# Patient Record
Sex: Female | Born: 1950 | Race: White | Hispanic: No | Marital: Married | Smoking: Never smoker
Health system: Southern US, Community
[De-identification: ages and names within clinical notes are randomized; demographics above are authoritative.]

## PROBLEM LIST (undated history)

## (undated) DIAGNOSIS — R32 Unspecified urinary incontinence: Secondary | ICD-10-CM

## (undated) DIAGNOSIS — R87619 Unspecified abnormal cytological findings in specimens from cervix uteri: Secondary | ICD-10-CM

## (undated) DIAGNOSIS — Z8619 Personal history of other infectious and parasitic diseases: Secondary | ICD-10-CM

## (undated) DIAGNOSIS — K219 Gastro-esophageal reflux disease without esophagitis: Secondary | ICD-10-CM

## (undated) DIAGNOSIS — M1990 Unspecified osteoarthritis, unspecified site: Secondary | ICD-10-CM

## (undated) DIAGNOSIS — IMO0002 Reserved for concepts with insufficient information to code with codable children: Secondary | ICD-10-CM

## (undated) DIAGNOSIS — I10 Essential (primary) hypertension: Secondary | ICD-10-CM

## (undated) DIAGNOSIS — C4491 Basal cell carcinoma of skin, unspecified: Secondary | ICD-10-CM

## (undated) DIAGNOSIS — E785 Hyperlipidemia, unspecified: Secondary | ICD-10-CM

## (undated) HISTORY — DX: Unspecified urinary incontinence: R32

## (undated) HISTORY — DX: Unspecified osteoarthritis, unspecified site: M19.90

## (undated) HISTORY — DX: Personal history of other infectious and parasitic diseases: Z86.19

## (undated) HISTORY — DX: Unspecified abnormal cytological findings in specimens from cervix uteri: R87.619

## (undated) HISTORY — DX: Hyperlipidemia, unspecified: E78.5

## (undated) HISTORY — DX: Reserved for concepts with insufficient information to code with codable children: IMO0002

## (undated) HISTORY — DX: Basal cell carcinoma of skin, unspecified: C44.91

## (undated) HISTORY — PX: EYE SURGERY: SHX253

## (undated) HISTORY — PX: BREAST CYST ASPIRATION: SHX578

## (undated) HISTORY — DX: Gastro-esophageal reflux disease without esophagitis: K21.9

## (undated) HISTORY — DX: Essential (primary) hypertension: I10

---
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
## 1997-06-12 ENCOUNTER — Other Ambulatory Visit: Admission: RE | Admit: 1997-06-12 | Discharge: 1997-06-12 | Payer: Self-pay | Admitting: Obstetrics and Gynecology

## 1998-08-18 ENCOUNTER — Other Ambulatory Visit: Admission: RE | Admit: 1998-08-18 | Discharge: 1998-08-18 | Payer: Self-pay | Admitting: Obstetrics and Gynecology

## 1999-08-06 ENCOUNTER — Encounter: Payer: Self-pay | Admitting: Obstetrics and Gynecology

## 1999-08-06 ENCOUNTER — Encounter: Admission: RE | Admit: 1999-08-06 | Discharge: 1999-08-06 | Payer: Self-pay | Admitting: Obstetrics and Gynecology

## 1999-08-26 ENCOUNTER — Other Ambulatory Visit: Admission: RE | Admit: 1999-08-26 | Discharge: 1999-08-26 | Payer: Self-pay | Admitting: Obstetrics and Gynecology

## 2000-08-14 ENCOUNTER — Encounter: Payer: Self-pay | Admitting: Obstetrics and Gynecology

## 2000-08-14 ENCOUNTER — Encounter: Admission: RE | Admit: 2000-08-14 | Discharge: 2000-08-14 | Payer: Self-pay | Admitting: Obstetrics and Gynecology

## 2000-08-28 ENCOUNTER — Other Ambulatory Visit: Admission: RE | Admit: 2000-08-28 | Discharge: 2000-08-28 | Payer: Self-pay | Admitting: Obstetrics and Gynecology

## 2000-09-06 ENCOUNTER — Other Ambulatory Visit: Admission: RE | Admit: 2000-09-06 | Discharge: 2000-09-06 | Payer: Self-pay | Admitting: Obstetrics and Gynecology

## 2001-09-21 ENCOUNTER — Encounter: Admission: RE | Admit: 2001-09-21 | Discharge: 2001-09-21 | Payer: Self-pay | Admitting: Unknown Physician Specialty

## 2001-09-21 ENCOUNTER — Encounter: Payer: Self-pay | Admitting: Unknown Physician Specialty

## 2002-09-23 ENCOUNTER — Encounter: Admission: RE | Admit: 2002-09-23 | Discharge: 2002-09-23 | Payer: Self-pay | Admitting: Unknown Physician Specialty

## 2002-09-23 ENCOUNTER — Encounter: Payer: Self-pay | Admitting: Unknown Physician Specialty

## 2004-05-03 ENCOUNTER — Ambulatory Visit: Payer: Self-pay | Admitting: Unknown Physician Specialty

## 2004-11-22 ENCOUNTER — Ambulatory Visit: Payer: Self-pay | Admitting: Gastroenterology

## 2004-12-01 ENCOUNTER — Ambulatory Visit: Payer: Self-pay | Admitting: Unknown Physician Specialty

## 2005-12-01 ENCOUNTER — Ambulatory Visit: Payer: Self-pay | Admitting: Unknown Physician Specialty

## 2006-02-21 HISTORY — PX: COLONOSCOPY: SHX174

## 2006-12-07 ENCOUNTER — Ambulatory Visit: Payer: Self-pay | Admitting: Unknown Physician Specialty

## 2007-02-12 ENCOUNTER — Ambulatory Visit: Payer: Self-pay | Admitting: Unknown Physician Specialty

## 2007-12-27 ENCOUNTER — Ambulatory Visit: Payer: Self-pay | Admitting: Unknown Physician Specialty

## 2008-02-22 HISTORY — PX: FINE NEEDLE ASPIRATION: SHX406

## 2009-01-07 ENCOUNTER — Ambulatory Visit: Payer: Self-pay | Admitting: Unknown Physician Specialty

## 2009-01-13 ENCOUNTER — Ambulatory Visit: Payer: Self-pay | Admitting: Unknown Physician Specialty

## 2009-02-24 ENCOUNTER — Ambulatory Visit: Payer: Self-pay | Admitting: Unknown Physician Specialty

## 2010-01-08 ENCOUNTER — Ambulatory Visit: Payer: Self-pay | Admitting: General Surgery

## 2011-03-03 ENCOUNTER — Ambulatory Visit: Payer: Self-pay

## 2012-01-11 LAB — HM MAMMOGRAPHY

## 2012-01-12 LAB — HM PAP SMEAR: HM Pap smear: NEGATIVE

## 2012-11-16 ENCOUNTER — Ambulatory Visit (INDEPENDENT_AMBULATORY_CARE_PROVIDER_SITE_OTHER): Payer: BC Managed Care – PPO | Admitting: Internal Medicine

## 2012-11-16 ENCOUNTER — Encounter: Payer: Self-pay | Admitting: Internal Medicine

## 2012-11-16 VITALS — BP 110/70 | HR 66 | Temp 98.1°F | Ht 64.5 in | Wt 141.2 lb

## 2012-11-16 DIAGNOSIS — IMO0002 Reserved for concepts with insufficient information to code with codable children: Secondary | ICD-10-CM

## 2012-11-16 DIAGNOSIS — R6889 Other general symptoms and signs: Secondary | ICD-10-CM

## 2012-11-16 DIAGNOSIS — R32 Unspecified urinary incontinence: Secondary | ICD-10-CM

## 2012-11-16 DIAGNOSIS — R002 Palpitations: Secondary | ICD-10-CM

## 2012-11-16 DIAGNOSIS — F439 Reaction to severe stress, unspecified: Secondary | ICD-10-CM

## 2012-11-16 DIAGNOSIS — Z733 Stress, not elsewhere classified: Secondary | ICD-10-CM

## 2012-11-16 DIAGNOSIS — R0602 Shortness of breath: Secondary | ICD-10-CM

## 2012-11-18 ENCOUNTER — Encounter: Payer: Self-pay | Admitting: Internal Medicine

## 2012-11-18 DIAGNOSIS — R32 Unspecified urinary incontinence: Secondary | ICD-10-CM | POA: Insufficient documentation

## 2012-11-18 DIAGNOSIS — R87619 Unspecified abnormal cytological findings in specimens from cervix uteri: Secondary | ICD-10-CM | POA: Insufficient documentation

## 2012-11-18 DIAGNOSIS — F439 Reaction to severe stress, unspecified: Secondary | ICD-10-CM | POA: Insufficient documentation

## 2012-11-18 NOTE — Progress Notes (Signed)
Subjective:    Patient ID: Theresa Francis, female    DOB: 09/21/1950, 62 y.o.   MRN: [id number]  HPI 62 year old female with past history of abnormal pap smear who comes in today to establish care.  She has been followed at [facility].  Last physical at [facility] was 11/14.  Pap smears are done every other year.  Has minimal incontinence.  Has noticed some pressure when she is sitting.  Notices the pressure between her rectum and vagina (on left side).  No back or hip issues.  Not a severe pain.  Has been checked on multiple occasions with no etiology found.  Exercises.  Does Yoga three days/week.  Walks two days per week.  Has noticed some light headedness with increased stress.  No headache.  Some increased palpitations and chest discomfort.  Seems to be getting more frequent and more intense.     Past Medical History  Diagnosis Date  . History of chicken pox   . History of shingles   . Urine incontinence     H/O occasional  . Abnormal Pap smear     s/p conization.     Outpatient Encounter Prescriptions as of 11/16/2012  Medication Sig Dispense Refill  . aspirin EC 81 MG tablet Take 81 mg by mouth daily.      . Calcium Carbonate-Vitamin D (CALTRATE 600+D PO) Take by mouth daily.      . cetirizine (ZYRTEC) 10 MG tablet Take 10 mg by mouth daily as needed for allergies.      . Glucosamine-MSM-Hyaluronic Acd (JOINT HEALTH PO) Take by mouth as needed (joint pain). Emergen-C powder       No facility-administered encounter medications on file as of 11/16/2012.    Review of Systems Patient denies any headache, lightheadedness or dizziness.  No sinus or allergy symptoms.  Reports the chest discomfort as outlined.  Some palpitations.    No increased shortness of breath, cough or congestion.  No nausea or vomiting.  No acid reflux.  No abdominal pain or cramping.  No bowel change, such as diarrhea, constipation, BRBPR or melana.  Some occasional urinary incontinence.       Objective:   Physical Exam Filed Vitals:   11/16/12 1048  BP: 110/70  Pulse: 66  Temp: 98.1 F (58.28 C)   62 year old female in no acute distress.   HEENT:  Nares- clear.  Oropharynx - without lesions. NECK:  Supple.  Nontender.  No audible bruit.  HEART:  Appears to be regular. LUNGS:  No crackles or wheezing audible.  Respirations even and unlabored.  RADIAL PULSE:  Equal bilaterally.    BREASTS:  Performed by gyn.   ABDOMEN:  Soft, nontender.  Bowel sounds present and normal.  No audible abdominal bruit.  GU:  Performed by gyn.     EXTREMITIES:  No increased edema present.  DP pulses palpable and equal bilaterally.          Assessment & Plan:  CARDIOVASCULAR.  Symptoms as outlined.  EKG obtained and revealed SR with no acute ischemic changes.  Given persistent symptoms and given increase in symptoms, will obtain stress echo to confirm no active ischemia.  Continue risk factor modification.    HEALTH MAINTENANCE.  Had her breast, pelvic and pap smear at [facility] in 11/13.  States everything checked out fine.  Due in 11/14 for pelvic and mammo.  Colonoscopy 2007.  No polyps.  Recommend a f/u in 10 years.    I spent  45 minutes with the patient and more than 50% of the time was spent in consultation regarding the above.

## 2012-11-18 NOTE — Assessment & Plan Note (Signed)
Previous abnormal pap.  S/p conization.  Most recent paps ok.  Followed at [facility].

## 2012-11-18 NOTE — Assessment & Plan Note (Signed)
Saw a counselor when her sister passed away.  Still with some stress.  Feels she is handling things relatively well.  Will give her the name and number of a counselor.  Follow.

## 2012-11-18 NOTE — Assessment & Plan Note (Signed)
Appears to have minimal stress incontinence.  Discussed Kaegel exercises.  Follow.

## 2012-11-20 ENCOUNTER — Telehealth: Payer: Self-pay | Admitting: Emergency Medicine

## 2012-11-20 NOTE — Telephone Encounter (Signed)
 [facility] 12/07/12 @ 8am. Arrive @ 750am. The patient is aware of the apt

## 2012-11-25 ENCOUNTER — Other Ambulatory Visit: Payer: Self-pay | Admitting: Internal Medicine

## 2012-11-25 DIAGNOSIS — R32 Unspecified urinary incontinence: Secondary | ICD-10-CM

## 2012-11-25 DIAGNOSIS — R0602 Shortness of breath: Secondary | ICD-10-CM

## 2012-11-25 DIAGNOSIS — Z13220 Encounter for screening for lipoid disorders: Secondary | ICD-10-CM

## 2012-11-25 DIAGNOSIS — F439 Reaction to severe stress, unspecified: Secondary | ICD-10-CM

## 2012-11-25 NOTE — Progress Notes (Signed)
Orders placed for labs

## 2012-11-26 ENCOUNTER — Other Ambulatory Visit (INDEPENDENT_AMBULATORY_CARE_PROVIDER_SITE_OTHER): Payer: BC Managed Care – PPO

## 2012-11-26 DIAGNOSIS — Z13220 Encounter for screening for lipoid disorders: Secondary | ICD-10-CM

## 2012-11-26 DIAGNOSIS — Z733 Stress, not elsewhere classified: Secondary | ICD-10-CM

## 2012-11-26 DIAGNOSIS — R0602 Shortness of breath: Secondary | ICD-10-CM

## 2012-11-26 DIAGNOSIS — F439 Reaction to severe stress, unspecified: Secondary | ICD-10-CM

## 2012-11-26 LAB — COMPREHENSIVE METABOLIC PANEL
ALT: 14 U/L (ref 0–35)
AST: 18 U/L (ref 0–37)
Albumin: 4.3 g/dL (ref 3.5–5.2)
Alkaline Phosphatase: 63 U/L (ref 39–117)
Calcium: 9 mg/dL (ref 8.4–10.5)
Chloride: 106 mEq/L (ref 96–112)
Potassium: 4.3 mEq/L (ref 3.5–5.1)
Total Protein: 7.2 g/dL (ref 6.0–8.3)

## 2012-11-26 LAB — CBC WITH DIFFERENTIAL/PLATELET
Basophils Absolute: 0 10*3/uL (ref 0.0–0.1)
Basophils Relative: 0.6 % (ref 0.0–3.0)
Eosinophils Absolute: 0.1 10*3/uL (ref 0.0–0.7)
Eosinophils Relative: 1.4 % (ref 0.0–5.0)
HCT: 37.8 % (ref 36.0–46.0)
Hemoglobin: 12.8 g/dL (ref 12.0–15.0)
Lymphocytes Relative: 28.7 % (ref 12.0–46.0)
Lymphs Abs: 2.1 10*3/uL (ref 0.7–4.0)
MCHC: 34 g/dL (ref 30.0–36.0)
MCV: 85.4 fl (ref 78.0–100.0)
Monocytes Absolute: 0.6 10*3/uL (ref 0.1–1.0)
Monocytes Relative: 7.8 % (ref 3.0–12.0)
Neutro Abs: 4.4 10*3/uL (ref 1.4–7.7)
Neutrophils Relative %: 61.5 % (ref 43.0–77.0)
Platelets: 298 10*3/uL (ref 150.0–400.0)
RBC: 4.43 Mil/uL (ref 3.87–5.11)
RDW: 13.5 % (ref 11.5–14.6)
WBC: 7.2 10*3/uL (ref 4.5–10.5)

## 2012-11-26 LAB — LIPID PANEL
Total CHOL/HDL Ratio: 4
Triglycerides: 60 mg/dL (ref 0.0–149.0)
VLDL: 12 mg/dL (ref 0.0–40.0)

## 2012-11-26 LAB — LDL CHOLESTEROL, DIRECT: Direct LDL: 130.6 mg/dL

## 2012-11-27 ENCOUNTER — Other Ambulatory Visit: Payer: Self-pay | Admitting: Internal Medicine

## 2012-11-27 NOTE — Progress Notes (Signed)
Orders placed for f/u liver panel.

## 2012-11-30 ENCOUNTER — Encounter: Payer: Self-pay | Admitting: *Deleted

## 2012-12-07 ENCOUNTER — Ambulatory Visit: Payer: BC Managed Care – PPO | Admitting: Cardiology

## 2012-12-10 ENCOUNTER — Other Ambulatory Visit (INDEPENDENT_AMBULATORY_CARE_PROVIDER_SITE_OTHER): Payer: BC Managed Care – PPO

## 2012-12-10 DIAGNOSIS — R17 Unspecified jaundice: Secondary | ICD-10-CM

## 2012-12-10 LAB — HEPATIC FUNCTION PANEL
ALT: 17 U/L (ref 0–35)
AST: 19 U/L (ref 0–37)
Albumin: 4.4 g/dL (ref 3.5–5.2)
Alkaline Phosphatase: 68 U/L (ref 39–117)
Bilirubin, Direct: 0.2 mg/dL (ref 0.0–0.3)
Total Bilirubin: 1.7 mg/dL — ABNORMAL HIGH (ref 0.3–1.2)
Total Protein: 7.1 g/dL (ref 6.0–8.3)

## 2012-12-11 ENCOUNTER — Encounter: Payer: Self-pay | Admitting: Internal Medicine

## 2012-12-11 DIAGNOSIS — M8580 Other specified disorders of bone density and structure, unspecified site: Secondary | ICD-10-CM | POA: Insufficient documentation

## 2012-12-11 DIAGNOSIS — M810 Age-related osteoporosis without current pathological fracture: Secondary | ICD-10-CM | POA: Insufficient documentation

## 2012-12-13 ENCOUNTER — Other Ambulatory Visit: Payer: Self-pay | Admitting: Internal Medicine

## 2012-12-19 ENCOUNTER — Ambulatory Visit: Payer: Self-pay | Admitting: Internal Medicine

## 2012-12-21 ENCOUNTER — Other Ambulatory Visit: Payer: BC Managed Care – PPO

## 2012-12-23 ENCOUNTER — Other Ambulatory Visit: Payer: Self-pay | Admitting: Internal Medicine

## 2012-12-23 NOTE — Progress Notes (Signed)
Order placed for f/u liver panel.  

## 2013-01-10 ENCOUNTER — Other Ambulatory Visit (INDEPENDENT_AMBULATORY_CARE_PROVIDER_SITE_OTHER): Payer: BC Managed Care – PPO

## 2013-01-10 DIAGNOSIS — R0602 Shortness of breath: Secondary | ICD-10-CM

## 2013-01-10 DIAGNOSIS — R079 Chest pain, unspecified: Secondary | ICD-10-CM

## 2013-01-11 ENCOUNTER — Encounter: Payer: Self-pay | Admitting: *Deleted

## 2013-01-22 ENCOUNTER — Ambulatory Visit (INDEPENDENT_AMBULATORY_CARE_PROVIDER_SITE_OTHER): Payer: BC Managed Care – PPO | Admitting: Internal Medicine

## 2013-01-22 ENCOUNTER — Other Ambulatory Visit (HOSPITAL_COMMUNITY)
Admission: RE | Admit: 2013-01-22 | Discharge: 2013-01-22 | Disposition: A | Discharge status: Home / Self Care | Payer: BC Managed Care – PPO | Source: Ambulatory Visit | Attending: Internal Medicine | Admitting: Internal Medicine

## 2013-01-22 ENCOUNTER — Encounter: Payer: Self-pay | Admitting: Internal Medicine

## 2013-01-22 VITALS — BP 118/70 | HR 74 | Temp 98.6°F | Ht 63.0 in | Wt 139.8 lb

## 2013-01-22 DIAGNOSIS — Z1211 Encounter for screening for malignant neoplasm of colon: Secondary | ICD-10-CM

## 2013-01-22 DIAGNOSIS — Z124 Encounter for screening for malignant neoplasm of cervix: Secondary | ICD-10-CM

## 2013-01-22 DIAGNOSIS — F439 Reaction to severe stress, unspecified: Secondary | ICD-10-CM

## 2013-01-22 DIAGNOSIS — R17 Unspecified jaundice: Secondary | ICD-10-CM

## 2013-01-22 DIAGNOSIS — IMO0002 Reserved for concepts with insufficient information to code with codable children: Secondary | ICD-10-CM

## 2013-01-22 DIAGNOSIS — M899 Disorder of bone, unspecified: Secondary | ICD-10-CM

## 2013-01-22 DIAGNOSIS — M8580 Other specified disorders of bone density and structure, unspecified site: Secondary | ICD-10-CM

## 2013-01-22 DIAGNOSIS — Z01419 Encounter for gynecological examination (general) (routine) without abnormal findings: Secondary | ICD-10-CM | POA: Insufficient documentation

## 2013-01-22 DIAGNOSIS — Z1151 Encounter for screening for human papillomavirus (HPV): Secondary | ICD-10-CM | POA: Insufficient documentation

## 2013-01-22 DIAGNOSIS — Z733 Stress, not elsewhere classified: Secondary | ICD-10-CM

## 2013-01-22 DIAGNOSIS — R6889 Other general symptoms and signs: Secondary | ICD-10-CM

## 2013-01-22 DIAGNOSIS — R945 Abnormal results of liver function studies: Secondary | ICD-10-CM | POA: Insufficient documentation

## 2013-01-22 DIAGNOSIS — R7989 Other specified abnormal findings of blood chemistry: Secondary | ICD-10-CM

## 2013-01-22 LAB — HEPATIC FUNCTION PANEL
Albumin: 4.6 g/dL (ref 3.5–5.2)
Alkaline Phosphatase: 70 U/L (ref 39–117)
Total Bilirubin: 1.4 mg/dL — ABNORMAL HIGH (ref 0.3–1.2)
Total Protein: 7.4 g/dL (ref 6.0–8.3)

## 2013-01-22 NOTE — Progress Notes (Signed)
  Subjective:    Patient ID: Theresa Francis, female    DOB: [birth year], 62 y.o.   MRN: [id number]  HPI 62 year old female with past history of abnormal pap smear who comes in today for a complete physical exam.  She has been followed at [facility].   Pap smears are done every other year.  She is due now.  Exercises.  Does Yoga three days/week.  Walks two days per week.  No headache.  Handling stress well.  Her son-n-law has completed treatments.  Feels she is handling stress well.  Breathing stable.  No cardiac symptoms with increased activity or exertion.  No acid reflux.  No nausea or vomiting.  No bowel change.  Overall she feels she is doing relatively well.     Past Medical History  Diagnosis Date  . History of chicken pox   . History of shingles   . Urine incontinence     H/O occasional  . Abnormal Pap smear     s/p conization.     Outpatient Encounter Prescriptions as of 01/22/2013  Medication Sig  . Calcium Carbonate-Vitamin D (CALTRATE 600+D PO) Take by mouth daily.  . cetirizine (ZYRTEC) 10 MG tablet Take 10 mg by mouth daily as needed for allergies.  . Glucosamine-MSM-Hyaluronic Acd (JOINT HEALTH PO) Take by mouth as needed (joint pain). Emergen-C powder  . aspirin EC 81 MG tablet Take 81 mg by mouth daily.    Review of Systems Patient denies any headache, lightheadedness or dizziness.  No sinus or allergy symptoms.  No cardiac symptoms with increased activity or exertion.  No increased shortness of breath, cough or congestion.  No nausea or vomiting.  No acid reflux.  No abdominal pain or cramping.  No bowel change, such as diarrhea, constipation, BRBPR or melana.  Had persistent elevated bilirubin.  Liver function last checked wnl.  Abdominal ultrasound revealed no gallstones.  Appears to have a polyp present.  No other acute abnormality.       Objective:   Physical Exam  Filed Vitals:   01/22/13 1339  BP: 118/70  Pulse: 74  Temp: 98.6 F (19 C)   62 year old  female in no acute distress.   HEENT:  Nares- clear.  Oropharynx - without lesions. NECK:  Supple.  Nontender.  No audible bruit.  HEART:  Appears to be regular. LUNGS:  No crackles or wheezing audible.  Respirations even and unlabored.  RADIAL PULSE:  Equal bilaterally.    BREASTS:  No nipple discharge or nipple retraction present.  Could not appreciate any distinct nodules or axillary adenopathy.  ABDOMEN:  Soft, nontender.  Bowel sounds present and normal.  No audible abdominal bruit.  GU:  Normal external genitalia.  Vaginal vault without lesions.  Cervix identified.  Pap performed. Could not appreciate any adnexal masses or tenderness.   RECTAL:  Heme negative.   EXTREMITIES:  No increased edema present.  DP pulses palpable and equal bilaterally.          Assessment & Plan:  CARDIOVASCULAR. Currently asymptomatic.  Stress test negative.  Follow.    HEALTH MAINTENANCE.  Physical today.   Colonoscopy 2007.  No polyps.  Recommend a f/u in 10 years.    Mammogram is scheduled tomorrow.  IFOB given today.

## 2013-01-22 NOTE — Progress Notes (Signed)
Pre-visit discussion using our clinic review tool. No additional management support is needed unless otherwise documented below in the visit note.  

## 2013-01-23 ENCOUNTER — Ambulatory Visit: Payer: Self-pay | Admitting: Internal Medicine

## 2013-01-23 ENCOUNTER — Encounter: Payer: Self-pay | Admitting: *Deleted

## 2013-01-23 ENCOUNTER — Encounter: Payer: Self-pay | Admitting: Internal Medicine

## 2013-01-23 LAB — HM MAMMOGRAPHY

## 2013-01-26 ENCOUNTER — Encounter: Payer: Self-pay | Admitting: Internal Medicine

## 2013-01-26 NOTE — Assessment & Plan Note (Signed)
Saw a counselor when her sister passed away.  Still with some stress.  Feels she is handling things relatively well.

## 2013-01-26 NOTE — Assessment & Plan Note (Signed)
Recent liver function wnl.  Bilirubin elevated.  Recent abdominal ultrasound as outlined.  Recheck liver panel today.  Follow.  Currently asymptomatic.    

## 2013-01-26 NOTE — Assessment & Plan Note (Signed)
Previous abnormal pap.  S/p conization.  Most recent paps ok.  Was followed at [facility].  Repeat pelvic and pap today.

## 2013-01-26 NOTE — Assessment & Plan Note (Signed)
Weight bearing exercise.  Follow.   

## 2013-02-04 ENCOUNTER — Ambulatory Visit: Payer: Self-pay | Admitting: Internal Medicine

## 2013-02-04 LAB — HM MAMMOGRAPHY

## 2013-02-06 ENCOUNTER — Encounter: Payer: Self-pay | Admitting: Internal Medicine

## 2013-02-18 ENCOUNTER — Encounter: Payer: Self-pay | Admitting: Internal Medicine

## 2013-09-27 ENCOUNTER — Ambulatory Visit: Payer: Self-pay | Admitting: Internal Medicine

## 2013-09-27 ENCOUNTER — Ambulatory Visit (INDEPENDENT_AMBULATORY_CARE_PROVIDER_SITE_OTHER): Payer: BC Managed Care – PPO | Admitting: Internal Medicine

## 2013-09-27 ENCOUNTER — Telehealth: Payer: Self-pay | Admitting: Internal Medicine

## 2013-09-27 ENCOUNTER — Encounter: Payer: Self-pay | Admitting: Internal Medicine

## 2013-09-27 DIAGNOSIS — R945 Abnormal results of liver function studies: Secondary | ICD-10-CM

## 2013-09-27 DIAGNOSIS — F439 Reaction to severe stress, unspecified: Secondary | ICD-10-CM

## 2013-09-27 DIAGNOSIS — M8580 Other specified disorders of bone density and structure, unspecified site: Secondary | ICD-10-CM

## 2013-09-27 DIAGNOSIS — M949 Disorder of cartilage, unspecified: Secondary | ICD-10-CM

## 2013-09-27 DIAGNOSIS — M899 Disorder of bone, unspecified: Secondary | ICD-10-CM

## 2013-09-27 DIAGNOSIS — R7989 Other specified abnormal findings of blood chemistry: Secondary | ICD-10-CM

## 2013-09-27 DIAGNOSIS — M25559 Pain in unspecified hip: Secondary | ICD-10-CM | POA: Insufficient documentation

## 2013-09-27 DIAGNOSIS — R17 Unspecified jaundice: Secondary | ICD-10-CM

## 2013-09-27 DIAGNOSIS — Z733 Stress, not elsewhere classified: Secondary | ICD-10-CM

## 2013-09-27 LAB — HEPATIC FUNCTION PANEL
ALBUMIN: 4.4 g/dL (ref 3.5–5.2)
ALT: 18 U/L (ref 0–35)
AST: 20 U/L (ref 0–37)
Alkaline Phosphatase: 72 U/L (ref 39–117)
Bilirubin, Direct: 0.2 mg/dL (ref 0.0–0.3)
TOTAL PROTEIN: 7.4 g/dL (ref 6.0–8.3)
Total Bilirubin: 1.8 mg/dL — ABNORMAL HIGH (ref 0.2–1.2)

## 2013-09-27 MED ORDER — SERTRALINE HCL 50 MG PO TABS: 50.0000 mg | ORAL_TABLET | Freq: Every day | ORAL | Status: DC

## 2013-09-27 NOTE — Telephone Encounter (Signed)
Notify pt that I reveiwed her xray results and her hip xrays reveal arthritis changes bilaterally.  No acute fracture or dislocation seen.  The back xray reveals mild scoliosis (curvature of the spine) and arthritis changes.  Given her increased pain and xray findings, I would like to refer her to ortho and/or physical therapy for further evaluation.  If agreeable, let me know and I will place the order for the referral.    Dr Nicki Reaper

## 2013-09-27 NOTE — Progress Notes (Signed)
Pre visit review using our clinic review tool, if applicable. No additional management support is needed unless otherwise documented below in the visit note. 

## 2013-09-27 NOTE — Patient Instructions (Signed)
Theresa Francis -  Phone [phone]

## 2013-09-29 ENCOUNTER — Other Ambulatory Visit: Payer: Self-pay | Admitting: Internal Medicine

## 2013-09-29 ENCOUNTER — Encounter: Payer: Self-pay | Admitting: Internal Medicine

## 2013-09-29 NOTE — Progress Notes (Signed)
Order placed for f/u liver panel.  

## 2013-09-29 NOTE — Assessment & Plan Note (Addendum)
Increased stress as outlined.  Feels she is handling things relatively well.  Counseling if needed.  Number given for [employer] .

## 2013-09-29 NOTE — Progress Notes (Signed)
Subjective:    Patient ID: Theresa Francis, female    DOB: [birth year], 63 y.o.   MRN: [id number]  HPI 63 year old female with past history of abnormal pap smear who comes in today for a scheduled follow up.  Exercises.  Does Yoga three days/week.  Walks two days per week.  No headache.  Handling stress well.  Her son-n-law has completed treatments.  Other family stress dealing with family health issues.  Feels she is handling stress relatively well.  Breathing stable.  No cardiac symptoms with increased activity or exertion.  No acid reflux.  No nausea or vomiting.  No bowel change.  She has been having problems with her left hip > right.  Describes pain into her left groin.  Wakes up.  Occasional back pain.  Gait is different.  Has to occasionally lift her left leg to get into her car.  No known injury or trauma.    Past Medical History  Diagnosis Date  . History of chicken pox   . History of shingles   . Urine incontinence     H/O occasional  . Abnormal Pap smear     s/p conization.     Outpatient Encounter Prescriptions as of 09/27/2013  Medication Sig  . aspirin EC 81 MG tablet Take 81 mg by mouth daily.  . Calcium Carbonate-Vitamin D (CALTRATE 600+D PO) Take by mouth daily.  . cetirizine (ZYRTEC) 10 MG tablet Take 10 mg by mouth daily as needed for allergies.  Marland Kitchen NAPROXEN SODIUM PO Take by mouth daily.  . sertraline (ZOLOFT) 50 MG tablet Take 1 tablet (50 mg total) by mouth daily.  . [DISCONTINUED] Glucosamine-MSM-Hyaluronic Acd (JOINT HEALTH PO) Take by mouth as needed (joint pain). Emergen-C powder    Review of Systems Patient denies any headache, lightheadedness or dizziness.  No sinus or allergy symptoms.  No cardiac symptoms with increased activity or exertion.  No increased shortness of breath, cough or congestion.  No nausea or vomiting.  No acid reflux.  No abdominal pain or cramping.  No bowel change, such as diarrhea, constipation, BRBPR or melana.  Had persistent  elevated bilirubin.  Liver function last checked wnl.  Abdominal ultrasound revealed no gallstones.  Appears to have a polyp present.  No other acute abnormality.   Hip/groin pain as outlined.  Taking naproxen (one q day to two bid prn).       Objective:   Physical Exam  Filed Vitals:   09/27/13 0942  BP: 130/70  Pulse: 69  Temp: 98.5 F (43.103 C)   63 year old female in no acute distress.   HEENT:  Nares- clear.  Oropharynx - without lesions. NECK:  Supple.  Nontender.  No audible bruit.  HEART:  Appears to be regular. LUNGS:  No crackles or wheezing audible.  Respirations even and unlabored.  RADIAL PULSE:  Equal bilaterally.  ABDOMEN:  Soft, nontender.  Bowel sounds present and normal.  No audible abdominal bruit.   EXTREMITIES:  No increased edema present.  DP pulses palpable and equal bilaterally.   MSK:  Some increased pain with straight leg  Raise.  Increased discomfort in her groin.  Motor strength appears to be equal.  Increased discomfort with walking.  Increased discomfort with rotation of her upper leg.  No significant pain with abduction/adduction lower extremities.         Assessment & Plan:  CARDIOVASCULAR. Currently asymptomatic.  Stress test negative.  Follow.    HEALTH MAINTENANCE.  Physical  01/22/13.   Colonoscopy 2007.  No polyps.  Recommend a f/u in 10 years.    Mammogram 01/23/13 recommended follow up views.  F/u views 02/04/13 -Birads II.   I spent 25 minutes with the patient and more than 50% of the time was spent in consultation regarding the above.

## 2013-09-29 NOTE — Assessment & Plan Note (Signed)
Worsening pain as outlined.  Present for 18 months.  Symptoms and exam as outlined.  Check L-S spine xray and left/right hip xray.  Further w/up pending results.

## 2013-09-29 NOTE — Assessment & Plan Note (Signed)
Recent liver function wnl.  Bilirubin elevated.  Recent abdominal ultrasound as outlined.  Recheck liver panel today.  Follow.  Currently asymptomatic.

## 2013-09-29 NOTE — Assessment & Plan Note (Signed)
Previous abnormal pap.  S/p conization.  Most recent paps ok.  Was followed at [facility].  Repeat pap here 01/22/13 - negative with negative HPV.

## 2013-09-29 NOTE — Assessment & Plan Note (Signed)
Weight bearing exercise.  Follow.  Schedule f/u bone density.

## 2013-09-30 ENCOUNTER — Encounter: Payer: Self-pay | Admitting: *Deleted

## 2013-09-30 NOTE — Telephone Encounter (Signed)
Pt notified of results & will discuss further at her next appt

## 2013-10-16 ENCOUNTER — Ambulatory Visit: Payer: Self-pay | Admitting: Internal Medicine

## 2013-10-16 LAB — HM DEXA SCAN: HM DEXA SCAN: HIGH

## 2013-10-17 ENCOUNTER — Encounter: Payer: Self-pay | Admitting: *Deleted

## 2013-10-17 ENCOUNTER — Telehealth: Payer: Self-pay | Admitting: Internal Medicine

## 2013-10-17 NOTE — Telephone Encounter (Signed)
Notify pt that I received her bone density results and her bone density reveals osteoporosis.  I would like for her to schedule a f/u appt to discuss treatment options.  Schedule appt on 11/15/13 at 11:45 to discuss treatment options.

## 2013-10-17 NOTE — Telephone Encounter (Signed)
Letter mailed, & pt already has appt scheduled on 9/24 @ 11:30

## 2013-10-29 ENCOUNTER — Encounter (INDEPENDENT_AMBULATORY_CARE_PROVIDER_SITE_OTHER): Payer: Self-pay

## 2013-10-29 ENCOUNTER — Other Ambulatory Visit (INDEPENDENT_AMBULATORY_CARE_PROVIDER_SITE_OTHER): Payer: BC Managed Care – PPO

## 2013-10-29 DIAGNOSIS — R17 Unspecified jaundice: Secondary | ICD-10-CM

## 2013-10-29 LAB — HEPATIC FUNCTION PANEL
ALT: 15 U/L (ref 0–35)
AST: 21 U/L (ref 0–37)
Albumin: 4.5 g/dL (ref 3.5–5.2)
Alkaline Phosphatase: 67 U/L (ref 39–117)
BILIRUBIN DIRECT: 0.2 mg/dL (ref 0.0–0.3)
BILIRUBIN TOTAL: 1.1 mg/dL (ref 0.2–1.2)
Total Protein: 7.1 g/dL (ref 6.0–8.3)

## 2013-10-30 ENCOUNTER — Encounter: Payer: Self-pay | Admitting: *Deleted

## 2013-11-14 ENCOUNTER — Ambulatory Visit (INDEPENDENT_AMBULATORY_CARE_PROVIDER_SITE_OTHER): Payer: BC Managed Care – PPO | Admitting: Internal Medicine

## 2013-11-14 ENCOUNTER — Encounter: Payer: Self-pay | Admitting: Internal Medicine

## 2013-11-14 VITALS — BP 120/60 | HR 72 | Temp 98.6°F | Ht 63.0 in | Wt 135.2 lb

## 2013-11-14 DIAGNOSIS — M899 Disorder of bone, unspecified: Secondary | ICD-10-CM

## 2013-11-14 DIAGNOSIS — M949 Disorder of cartilage, unspecified: Secondary | ICD-10-CM

## 2013-11-14 DIAGNOSIS — M810 Age-related osteoporosis without current pathological fracture: Secondary | ICD-10-CM

## 2013-11-14 DIAGNOSIS — R945 Abnormal results of liver function studies: Secondary | ICD-10-CM

## 2013-11-14 DIAGNOSIS — M25559 Pain in unspecified hip: Secondary | ICD-10-CM

## 2013-11-14 DIAGNOSIS — Z733 Stress, not elsewhere classified: Secondary | ICD-10-CM

## 2013-11-14 DIAGNOSIS — R7989 Other specified abnormal findings of blood chemistry: Secondary | ICD-10-CM

## 2013-11-14 DIAGNOSIS — F439 Reaction to severe stress, unspecified: Secondary | ICD-10-CM

## 2013-11-14 DIAGNOSIS — M8580 Other specified disorders of bone density and structure, unspecified site: Secondary | ICD-10-CM

## 2013-11-14 LAB — COMPREHENSIVE METABOLIC PANEL
ALT: 18 U/L (ref 0–35)
AST: 17 U/L (ref 0–37)
Albumin: 4.6 g/dL (ref 3.5–5.2)
Alkaline Phosphatase: 74 U/L (ref 39–117)
BILIRUBIN TOTAL: 1.2 mg/dL (ref 0.2–1.2)
BUN: 12 mg/dL (ref 6–23)
CO2: 30 meq/L (ref 19–32)
CREATININE: 0.8 mg/dL (ref 0.4–1.2)
Calcium: 9.6 mg/dL (ref 8.4–10.5)
Chloride: 104 mEq/L (ref 96–112)
GFR: 80.2 mL/min (ref >60.00)
GLUCOSE: 97 mg/dL (ref 70–99)
Potassium: 4.8 mEq/L (ref 3.5–5.1)
Sodium: 141 mEq/L (ref 135–145)
Total Protein: 6.9 g/dL (ref 6.0–8.3)

## 2013-11-14 LAB — URINALYSIS, ROUTINE W REFLEX MICROSCOPIC
Hgb urine dipstick: NEGATIVE
Leukocytes, UA: NEGATIVE
NITRITE: NEGATIVE
RBC / HPF: NONE SEEN (ref 0–?)
Specific Gravity, Urine: 1.015 (ref 1.000–1.030)
UROBILINOGEN UA: 0.2 (ref 0.0–1.0)
Urine Glucose: NEGATIVE
WBC UA: NONE SEEN (ref 0–?)
pH: 7 (ref 5.0–8.0)

## 2013-11-14 LAB — VITAMIN D 25 HYDROXY (VIT D DEFICIENCY, FRACTURES): VITD: 32.59 ng/mL (ref 30.00–100.00)

## 2013-11-14 NOTE — Progress Notes (Signed)
Pre visit review using our clinic review tool, if applicable. No additional management support is needed unless otherwise documented below in the visit note. 

## 2013-11-15 ENCOUNTER — Encounter: Payer: Self-pay | Admitting: *Deleted

## 2013-11-15 ENCOUNTER — Ambulatory Visit: Payer: BC Managed Care – PPO | Admitting: Internal Medicine

## 2013-11-17 ENCOUNTER — Encounter: Payer: Self-pay | Admitting: Internal Medicine

## 2013-11-17 NOTE — Assessment & Plan Note (Signed)
Recent liver function wnl.  Bilirubin had been elevated.  Recent abdominal ultrasound as outlined.  Most recent bilirubin wnl.  Follow.  Currently asymptomatic.

## 2013-11-17 NOTE — Assessment & Plan Note (Signed)
Previous abnormal pap.  S/p conization.  Most recent paps ok.  Was followed at [facility].  Repeat pap here 01/22/13 - negative with negative HPV.

## 2013-11-17 NOTE — Progress Notes (Signed)
  Subjective:    Patient ID: Theresa Francis, female    DOB: 04-22-1950, 63 y.o.   MRN: [id number]  HPI 63 year old female with past history of abnormal pap smear who comes in today for a scheduled follow up.  Exercises.  Does Yoga three days/week.  Walks two days per week.  No headache.  Handling stress well.  Her son-n-law has completed treatments.  Other family stress dealing with family health issues.  Feels she is handling stress relatively well.  On zoloft now and feels this has helped to level things out.  Breathing stable.  No cardiac symptoms with increased activity or exertion. No acid reflux.  No nausea or vomiting.  No bowel change.   Has been having some hip pain.  See previous note for details.     Past Medical History  Diagnosis Date  . History of chicken pox   . History of shingles   . Urine incontinence     H/O occasional  . Abnormal Pap smear     s/p conization.     Outpatient Encounter Prescriptions as of 11/14/2013  Medication Sig  . aspirin EC 81 MG tablet Take 81 mg by mouth daily.  . Calcium Carbonate-Vitamin D (CALTRATE 600+D PO) Take by mouth daily.  . cetirizine (ZYRTEC) 10 MG tablet Take 10 mg by mouth daily as needed for allergies.  Marland Kitchen NAPROXEN SODIUM PO Take by mouth daily.  . sertraline (ZOLOFT) 50 MG tablet Take 1 tablet (50 mg total) by mouth daily.    Review of Systems Patient denies any headache, lightheadedness or dizziness.  No sinus or allergy symptoms.  No cardiac symptoms with increased activity or exertion.  No increased shortness of breath, cough or congestion.  No nausea or vomiting.  No acid reflux.  No abdominal pain or cramping.  No bowel change, such as diarrhea, constipation, BRBPR or melana.  Had persistent elevated bilirubin.  Liver function last checked wnl.  Bilirubin wnl.  Abdominal ultrasound revealed no gallstones.  Appears to have a polyp present.  No other acute abnormality.   Hip/groin pain as outlined.  Taking naproxen.        Objective:   Physical Exam  Filed Vitals:   11/14/13 1125  BP: 120/60  Pulse: 72  Temp: 98.6 F (13 C)   63 year old female in no acute distress.   HEENT:  Nares- clear.  Oropharynx - without lesions. NECK:  Supple.  Nontender.  No audible bruit.  HEART:  Appears to be regular. LUNGS:  No crackles or wheezing audible.  Respirations even and unlabored.  RADIAL PULSE:  Equal bilaterally.  ABDOMEN:  Soft, nontender.  Bowel sounds present and normal.  No audible abdominal bruit.   EXTREMITIES:  No increased edema present.  DP pulses palpable and equal bilaterally.   MSK:  Some increased pain with straight leg  Raise.  Increased discomfort in her groin.         Assessment & Plan:  CARDIOVASCULAR. Currently asymptomatic.  Stress test negative.  Follow.    HEALTH MAINTENANCE.  Physical 01/22/13.   Colonoscopy 2007.  No polyps.  Recommend a f/u in 10 years.    Mammogram 01/23/13 recommended follow up views.  F/u views 02/04/13 -Birads II.

## 2013-11-17 NOTE — Assessment & Plan Note (Signed)
Bone density 10/16/13 - -1.9 spine and -2.6 femoral neck.  Discussed treatment options.  Prefers reclast.  Form completed.

## 2013-11-17 NOTE — Assessment & Plan Note (Signed)
Increased stress as outlined.  On zoloft.  Doing better.  Follow.

## 2013-11-17 NOTE — Assessment & Plan Note (Signed)
Worsening pain as outlined.  Present for 18 months.  Symptoms and exam as outlined.  Given persistent pain, will refer to PT for further evaluation and treatment.

## 2013-11-21 ENCOUNTER — Ambulatory Visit: Payer: BC Managed Care – PPO | Admitting: Internal Medicine

## 2013-11-25 ENCOUNTER — Telehealth: Payer: Self-pay | Admitting: *Deleted

## 2013-11-25 NOTE — Telephone Encounter (Signed)
Reclast form, demographics, notes, hip x-ray, & bone density results faxed to [facility]

## 2013-12-14 ENCOUNTER — Other Ambulatory Visit: Payer: Self-pay | Admitting: Internal Medicine

## 2014-01-10 ENCOUNTER — Encounter: Payer: Self-pay | Admitting: Internal Medicine

## 2014-01-30 ENCOUNTER — Ambulatory Visit (INDEPENDENT_AMBULATORY_CARE_PROVIDER_SITE_OTHER): Payer: BC Managed Care – PPO | Admitting: Internal Medicine

## 2014-01-30 ENCOUNTER — Encounter: Payer: Self-pay | Admitting: Internal Medicine

## 2014-01-30 VITALS — BP 110/70 | HR 71 | Temp 98.2°F | Ht 64.0 in | Wt 138.5 lb

## 2014-01-30 DIAGNOSIS — R87619 Unspecified abnormal cytological findings in specimens from cervix uteri: Secondary | ICD-10-CM

## 2014-01-30 DIAGNOSIS — M8580 Other specified disorders of bone density and structure, unspecified site: Secondary | ICD-10-CM

## 2014-01-30 DIAGNOSIS — E7800 Pure hypercholesterolemia, unspecified: Secondary | ICD-10-CM | POA: Insufficient documentation

## 2014-01-30 DIAGNOSIS — R7989 Other specified abnormal findings of blood chemistry: Secondary | ICD-10-CM

## 2014-01-30 DIAGNOSIS — Z1211 Encounter for screening for malignant neoplasm of colon: Secondary | ICD-10-CM

## 2014-01-30 DIAGNOSIS — M810 Age-related osteoporosis without current pathological fracture: Secondary | ICD-10-CM

## 2014-01-30 DIAGNOSIS — Z658 Other specified problems related to psychosocial circumstances: Secondary | ICD-10-CM

## 2014-01-30 DIAGNOSIS — R945 Abnormal results of liver function studies: Secondary | ICD-10-CM

## 2014-01-30 DIAGNOSIS — Z1239 Encounter for other screening for malignant neoplasm of breast: Secondary | ICD-10-CM

## 2014-01-30 DIAGNOSIS — F439 Reaction to severe stress, unspecified: Secondary | ICD-10-CM

## 2014-01-30 DIAGNOSIS — M25559 Pain in unspecified hip: Secondary | ICD-10-CM

## 2014-01-30 LAB — CBC WITH DIFFERENTIAL/PLATELET
BASOS PCT: 0.7 % (ref 0.0–3.0)
Basophils Absolute: 0 10*3/uL (ref 0.0–0.1)
EOS PCT: 0.9 % (ref 0.0–5.0)
Eosinophils Absolute: 0.1 10*3/uL (ref 0.0–0.7)
HEMATOCRIT: 39.7 % (ref 36.0–46.0)
Hemoglobin: 12.9 g/dL (ref 12.0–15.0)
LYMPHS ABS: 2.2 10*3/uL (ref 0.7–4.0)
Lymphocytes Relative: 35.2 % (ref 12.0–46.0)
MCHC: 32.6 g/dL (ref 30.0–36.0)
MCV: 87.3 fl (ref 78.0–100.0)
MONO ABS: 0.5 10*3/uL (ref 0.1–1.0)
Monocytes Relative: 7.4 % (ref 3.0–12.0)
Neutro Abs: 3.5 10*3/uL (ref 1.4–7.7)
Neutrophils Relative %: 55.8 % (ref 43.0–77.0)
Platelets: 309 10*3/uL (ref 150.0–400.0)
RBC: 4.55 Mil/uL (ref 3.87–5.11)
RDW: 14.1 % (ref 11.5–15.5)
WBC: 6.4 10*3/uL (ref 4.0–10.5)

## 2014-01-30 LAB — COMPREHENSIVE METABOLIC PANEL
ALBUMIN: 4.5 g/dL (ref 3.5–5.2)
ALK PHOS: 71 U/L (ref 39–117)
ALT: 17 U/L (ref 0–35)
AST: 20 U/L (ref 0–37)
BUN: 15 mg/dL (ref 6–23)
CO2: 26 meq/L (ref 19–32)
Calcium: 9.1 mg/dL (ref 8.4–10.5)
Chloride: 103 mEq/L (ref 96–112)
Creatinine, Ser: 0.6 mg/dL (ref 0.4–1.2)
GFR: 104.87 mL/min (ref >60.00)
GLUCOSE: 98 mg/dL (ref 70–99)
POTASSIUM: 4.4 meq/L (ref 3.5–5.1)
Sodium: 137 mEq/L (ref 135–145)
Total Bilirubin: 1.4 mg/dL — ABNORMAL HIGH (ref 0.2–1.2)
Total Protein: 7 g/dL (ref 6.0–8.3)

## 2014-01-30 LAB — TSH: TSH: 1.6 u[IU]/mL (ref 0.35–4.50)

## 2014-01-30 LAB — LIPID PANEL
CHOLESTEROL: 225 mg/dL — AB (ref 0–200)
HDL: 68.1 mg/dL (ref >39.00)
LDL Cholesterol: 146 mg/dL — ABNORMAL HIGH (ref 0–99)
NonHDL: 156.9
Total CHOL/HDL Ratio: 3
Triglycerides: 55 mg/dL (ref 0.0–149.0)
VLDL: 11 mg/dL (ref 0.0–40.0)

## 2014-01-30 NOTE — Progress Notes (Signed)
Subjective:    Patient ID: Theresa Francis, female    DOB: [birth year], 63 y.o.   MRN: [id number]  HPI 63 year old female with past history of abnormal pap smear who comes in today to follow up on some chronic issues and for her physical exam.  Exercises.  Does Yoga three days/week.  Walks.  No headache.  Family stress dealing with family health issues.  Brother-n-law passed away.  Feels she is handling stress relatively well.  On zoloft and feels this has helped to level things out.  Breathing stable.  No cardiac symptoms with increased activity or exertion. No acid reflux.  No nausea or vomiting.  No bowel change.   Persistent hip pain.  See previous note for details.  Xray with arthritis.  Needs physical therapy.     Past Medical History  Diagnosis Date  . History of chicken pox   . History of shingles   . Urine incontinence     H/O occasional  . Abnormal Pap smear     s/p conization.     Outpatient Encounter Prescriptions as of 01/30/2014  Medication Sig  . aspirin EC 81 MG tablet Take 81 mg by mouth daily.  . Calcium Carbonate-Vitamin D (CALTRATE 600+D PO) Take by mouth daily.  . cetirizine (ZYRTEC) 10 MG tablet Take 10 mg by mouth daily as needed for allergies.  . Glucos-Chondroit-Hyaluron-MSM (GLUCOSAMINE CHONDROITIN JOINT PO) Take by mouth 2 (two) times daily.  Marland Kitchen NAPROXEN SODIUM PO Take by mouth daily.  . sertraline (ZOLOFT) 50 MG tablet TAKE ONE TABLET BY MOUTH DAILY  . [DISCONTINUED] Cholecalciferol (VITAMIN D3) 1000 UNITS CAPS Take by mouth.    Review of Systems Patient denies any headache, lightheadedness or dizziness.  No sinus or allergy symptoms.  No cardiac symptoms with increased activity or exertion.  No increased shortness of breath, cough or congestion.  No nausea or vomiting.  No acid reflux.  No abdominal pain or cramping.  No bowel change, such as diarrhea, constipation, BRBPR or melana.  Had persistent elevated bilirubin.  Liver function last checked wnl.   Bilirubin wnl last check.  Abdominal ultrasound revealed no gallstones.  Appears to have a polyp present.  No other acute abnormality.   Hip/groin pain as outlined.  Taking naproxen.  Doing yoga.      Objective:   Physical Exam  Filed Vitals:   01/30/14 0819  BP: 110/70  Pulse: 71  Temp: 98.2 F (53.13 C)   63 year old female in no acute distress.   HEENT:  Nares- clear.  Oropharynx - without lesions. NECK:  Supple.  Nontender.  No audible bruit.  HEART:  Appears to be regular. LUNGS:  No crackles or wheezing audible.  Respirations even and unlabored.  RADIAL PULSE:  Equal bilaterally.    BREASTS:  No nipple discharge or nipple retraction present.  Could not appreciate any distinct nodules or axillary adenopathy.  ABDOMEN:  Soft, nontender.  Bowel sounds present and normal.  No audible abdominal bruit.  GU:  Not performed.    EXTREMITIES:  No increased edema present.  DP pulses palpable and equal bilaterally.          Assessment & Plan:   Abnormal liver function tests Last check wnl.  - Comprehensive metabolic panel; Future  Stress Doing well.  Follow.    Hip pain, unspecified laterality Persistent.  Previous xray with arthritis.  Bone density with osteoporosis of hip.   - Ambulatory referral to Physical Therapy  Abnormal  cervical Papanicolaou smear, unspecified abnormal pap finding Pap 01/22/13 negative with negative HPV.   Hypercholesterolemia Slight elevation in cholesterol.  Recheck today. Continue diet and exercise.   - Lipid panel; Future  Breast cancer screening - MM DIGITAL SCREENING BILATERAL; Future  Osteoporosis Recent bone density revealed osteoporosis.  Received reclast.    CARDIOVASCULAR. Currently asymptomatic.  Stress test negative.  Follow.    HEALTH MAINTENANCE.  Physical today.   Pap 01/22/13 negative with negative HPV.  Colonoscopy 2007.  No polyps.  Recommend a f/u in 10 years.    Mammogram 01/23/13 recommended follow up views.  F/u views 02/04/13  -Birads II.  Schedule f/u mammogram.  IFOB given.

## 2014-01-30 NOTE — Progress Notes (Signed)
Pre visit review using our clinic review tool, if applicable. No additional management support is needed unless otherwise documented below in the visit note. 

## 2014-01-31 ENCOUNTER — Other Ambulatory Visit (INDEPENDENT_AMBULATORY_CARE_PROVIDER_SITE_OTHER): Payer: BC Managed Care – PPO

## 2014-01-31 LAB — BILIRUBIN, DIRECT: Bilirubin, Direct: 0.2 mg/dL (ref 0.0–0.3)

## 2014-02-01 ENCOUNTER — Other Ambulatory Visit: Payer: Self-pay | Admitting: Internal Medicine

## 2014-02-01 NOTE — Progress Notes (Signed)
Order placed for f/u liver panel.  

## 2014-02-03 ENCOUNTER — Other Ambulatory Visit (INDEPENDENT_AMBULATORY_CARE_PROVIDER_SITE_OTHER): Payer: BC Managed Care – PPO

## 2014-02-03 ENCOUNTER — Encounter: Payer: Self-pay | Admitting: Internal Medicine

## 2014-02-03 DIAGNOSIS — Z1211 Encounter for screening for malignant neoplasm of colon: Secondary | ICD-10-CM

## 2014-02-04 LAB — FECAL OCCULT BLOOD, IMMUNOCHEMICAL: FECAL OCCULT BLD: NEGATIVE

## 2014-02-05 ENCOUNTER — Encounter: Payer: Self-pay | Admitting: Internal Medicine

## 2014-02-21 ENCOUNTER — Encounter: Payer: Self-pay | Admitting: Internal Medicine

## 2014-02-27 ENCOUNTER — Ambulatory Visit: Payer: Self-pay | Admitting: Internal Medicine

## 2014-03-11 ENCOUNTER — Ambulatory Visit: Payer: Self-pay | Admitting: Internal Medicine

## 2014-03-11 LAB — HM MAMMOGRAPHY

## 2014-03-12 ENCOUNTER — Encounter: Payer: Self-pay | Admitting: Internal Medicine

## 2014-03-24 ENCOUNTER — Encounter: Payer: Self-pay | Admitting: Internal Medicine

## 2014-04-22 ENCOUNTER — Encounter
Admit: 2014-04-22 | Disposition: A | Discharge status: Home / Self Care | Payer: Self-pay | Attending: Internal Medicine | Admitting: Internal Medicine

## 2014-04-23 ENCOUNTER — Other Ambulatory Visit: Payer: Self-pay | Admitting: Internal Medicine

## 2014-05-05 ENCOUNTER — Other Ambulatory Visit (INDEPENDENT_AMBULATORY_CARE_PROVIDER_SITE_OTHER): Payer: BC Managed Care – PPO

## 2014-05-05 ENCOUNTER — Other Ambulatory Visit (INDEPENDENT_AMBULATORY_CARE_PROVIDER_SITE_OTHER): Payer: Self-pay

## 2014-05-05 ENCOUNTER — Telehealth: Payer: Self-pay | Admitting: *Deleted

## 2014-05-05 DIAGNOSIS — E7800 Pure hypercholesterolemia, unspecified: Secondary | ICD-10-CM

## 2014-05-05 LAB — LIPID PANEL
Cholesterol: 188 mg/dL (ref 0–200)
HDL: 66.1 mg/dL (ref >39.00)
LDL Cholesterol: 111 mg/dL — ABNORMAL HIGH (ref 0–99)
NONHDL: 121.9
TRIGLYCERIDES: 53 mg/dL (ref 0.0–149.0)
Total CHOL/HDL Ratio: 3
VLDL: 10.6 mg/dL (ref 0.0–40.0)

## 2014-05-05 LAB — HEPATIC FUNCTION PANEL
ALK PHOS: 70 U/L (ref 39–117)
ALT: 19 U/L (ref 0–35)
AST: 20 U/L (ref 0–37)
Albumin: 4.7 g/dL (ref 3.5–5.2)
BILIRUBIN DIRECT: 0.1 mg/dL (ref 0.0–0.3)
BILIRUBIN TOTAL: 0.9 mg/dL (ref 0.2–1.2)
Total Protein: 7.4 g/dL (ref 6.0–8.3)

## 2014-05-05 NOTE — Telephone Encounter (Signed)
Order placed for lipid panel.

## 2014-05-05 NOTE — Telephone Encounter (Signed)
Pt would like a lipid panel done?

## 2014-05-06 ENCOUNTER — Encounter: Payer: Self-pay | Admitting: Internal Medicine

## 2014-05-06 ENCOUNTER — Ambulatory Visit (INDEPENDENT_AMBULATORY_CARE_PROVIDER_SITE_OTHER): Payer: BC Managed Care – PPO | Admitting: Internal Medicine

## 2014-05-06 VITALS — BP 120/70 | HR 75 | Temp 98.5°F | Ht 64.0 in | Wt 139.5 lb

## 2014-05-06 DIAGNOSIS — M810 Age-related osteoporosis without current pathological fracture: Secondary | ICD-10-CM

## 2014-05-06 DIAGNOSIS — R945 Abnormal results of liver function studies: Secondary | ICD-10-CM

## 2014-05-06 DIAGNOSIS — R7989 Other specified abnormal findings of blood chemistry: Secondary | ICD-10-CM

## 2014-05-06 DIAGNOSIS — E7800 Pure hypercholesterolemia, unspecified: Secondary | ICD-10-CM

## 2014-05-06 DIAGNOSIS — Z658 Other specified problems related to psychosocial circumstances: Secondary | ICD-10-CM

## 2014-05-06 DIAGNOSIS — Z0000 Encounter for general adult medical examination without abnormal findings: Secondary | ICD-10-CM

## 2014-05-06 DIAGNOSIS — M25559 Pain in unspecified hip: Secondary | ICD-10-CM

## 2014-05-06 DIAGNOSIS — R87619 Unspecified abnormal cytological findings in specimens from cervix uteri: Secondary | ICD-10-CM

## 2014-05-06 DIAGNOSIS — F439 Reaction to severe stress, unspecified: Secondary | ICD-10-CM

## 2014-05-06 NOTE — Progress Notes (Addendum)
Patient ID: Theresa Francis, female   DOB: 1950/12/11, 64 y.o.   MRN: [id number]   Subjective:    Patient ID: Theresa Francis, female    DOB: Jun 24, 1950, 64 y.o.   MRN: [id number]  HPI  Patient here for a scheduled follow up.   She is still having hip and pelvic pain.  Has been to physical therapy.  Taking naproxen.  She has been doing specific exercises (pelvic tilts,etc), but still with pain and discomfort.  Worse at times.  Had reclast.  Breathing stable.  Eating and drinking well.  Bowels stable.    Past Medical History  Diagnosis Date  . History of chicken pox   . History of shingles   . Urine incontinence     H/O occasional  . Abnormal Pap smear     s/p conization.      Current Outpatient Prescriptions on File Prior to Visit  Medication Sig Dispense Refill  . aspirin EC 81 MG tablet Take 81 mg by mouth daily.    . Calcium Carbonate-Vitamin D (CALTRATE 600+D PO) Take by mouth daily.    . cetirizine (ZYRTEC) 10 MG tablet Take 10 mg by mouth daily as needed for allergies.    . Glucos-Chondroit-Hyaluron-MSM (GLUCOSAMINE CHONDROITIN JOINT PO) Take by mouth 2 (two) times daily.    Marland Kitchen NAPROXEN SODIUM PO Take by mouth daily.    . sertraline (ZOLOFT) 50 MG tablet TAKE ONE TABLET EVERY DAY 30 tablet 2   No current facility-administered medications on file prior to visit.    Review of Systems  Constitutional: Negative for appetite change and unexpected weight change.  HENT: Negative for congestion and sinus pressure.   Respiratory: Negative for cough, chest tightness and shortness of breath.   Cardiovascular: Negative for chest pain, palpitations and leg swelling.  Gastrointestinal: Negative for nausea, vomiting, abdominal pain and diarrhea.  Genitourinary: Negative for dysuria and difficulty urinating.  Musculoskeletal:       Reports the hip and pelvic pain as outlined.    Skin: Negative for color change and rash.  Neurological: Negative for dizziness, light-headedness and  headaches.  Psychiatric/Behavioral: Negative for dysphoric mood and agitation.       Objective:    Physical Exam  Constitutional: She appears well-developed and well-nourished. No distress.  HENT:  Nose: Nose normal.  Mouth/Throat: Oropharynx is clear and moist.  Neck: Neck supple. No thyromegaly present.  Cardiovascular: Normal rate and regular rhythm.   Pulmonary/Chest: Breath sounds normal. No respiratory distress. She has no wheezes.  Abdominal: Soft. Bowel sounds are normal. There is no tenderness.  Musculoskeletal: She exhibits no edema or tenderness.  Lymphadenopathy:    She has no cervical adenopathy.  Skin: No rash noted. No erythema.    BP 120/70 mmHg  Pulse 75  Temp(Src) 98.5 F (36.9 C) (Oral)  Ht 5\' 4"  (1.626 m)  Wt 139 lb 8 oz (63.277 kg)  BMI 23.93 kg/m2  SpO2 97% Wt Readings from Last 3 Encounters:  05/06/14 139 lb 8 oz (63.277 kg)  01/30/14 138 lb 8 oz (62.823 kg)  11/14/13 135 lb 4 oz (61.349 kg)     Lab Results  Component Value Date   WBC 6.4 01/30/2014   HGB 12.9 01/30/2014   HCT 39.7 01/30/2014   PLT 309.0 01/30/2014   GLUCOSE 98 01/30/2014   CHOL 188 05/05/2014   TRIG 53.0 05/05/2014   HDL 66.10 05/05/2014   LDLDIRECT 130.6 11/26/2012   LDLCALC 111* 05/05/2014   ALT 19 05/05/2014  AST 20 05/05/2014   NA 137 01/30/2014   K 4.4 01/30/2014   CL 103 01/30/2014   CREATININE 0.6 01/30/2014   BUN 15 01/30/2014   CO2 26 01/30/2014   TSH 1.60 01/30/2014       Assessment & Plan:   Problem List Items Addressed This Visit    Abnormal liver function tests    05/05/14 liver panel wnl.        Abnormal Pap smear of cervix    History of previous abnormal pap.  S/p conization.  Most recent paps - ok.  PAP 01/22/13 negative with negative HPV.        Health care maintenance    Physical 01/30/14.  PAP 01/22/13 - negative with negative HPV.  Colonoscopy 2007.  Recommended follow up in 10 years.  Mammogram 03/11/14 - recommended f/u in 6 months.   Due 08/2014.       Hip pain - Primary    Persistent hip and pelvic pain despite antiinflammatories and physical therapy.  Refer to Dr Jefm Bryant for further evaluation and treatment.        Relevant Orders   Ambulatory referral to Rheumatology   Hypercholesterolemia    Low cholesterol diet and exercise.  Follow lipid panel.   Lab Results  Component Value Date   CHOL 188 05/05/2014   HDL 66.10 05/05/2014   LDLCALC 111* 05/05/2014   LDLDIRECT 130.6 11/26/2012   TRIG 53.0 05/05/2014   CHOLHDL 3 05/05/2014        Osteoporosis    Had reclast.  Follow.        Stress    On zoloft.  Feels she is doing relatively well.  Follow.          I spent 25 minutes with the patient and more than 50% of the time was spent in consultation regarding the above.     Einar Pheasant, MD

## 2014-05-06 NOTE — Progress Notes (Signed)
Pre visit review using our clinic review tool, if applicable. No additional management support is needed unless otherwise documented below in the visit note. 

## 2014-05-08 NOTE — Telephone Encounter (Signed)
Mailed unread message to pt  

## 2014-05-11 ENCOUNTER — Encounter: Payer: Self-pay | Admitting: Internal Medicine

## 2014-05-11 DIAGNOSIS — Z0000 Encounter for general adult medical examination without abnormal findings: Secondary | ICD-10-CM | POA: Insufficient documentation

## 2014-05-11 NOTE — Assessment & Plan Note (Signed)
05/05/14 - liver panel wnl.  

## 2014-05-11 NOTE — Assessment & Plan Note (Signed)
Persistent hip and pelvic pain despite antiinflammatories and physical therapy.  Refer to Dr Jefm Bryant for further evaluation and treatment.

## 2014-05-11 NOTE — Assessment & Plan Note (Signed)
Low cholesterol diet and exercise.  Follow lipid panel.   Lab Results  Component Value Date   CHOL 188 05/05/2014   HDL 66.10 05/05/2014   LDLCALC 111* 05/05/2014   LDLDIRECT 130.6 11/26/2012   TRIG 53.0 05/05/2014   CHOLHDL 3 05/05/2014

## 2014-05-11 NOTE — Assessment & Plan Note (Signed)
On zoloft.  Feels she is doing relatively well.  Follow.

## 2014-05-11 NOTE — Assessment & Plan Note (Signed)
Physical 01/30/14.  PAP 01/22/13 - negative with negative HPV.  Colonoscopy 2007.  Recommended follow up in 10 years.  Mammogram 03/11/14 - recommended f/u in 6 months.  Due 08/2014.

## 2014-05-11 NOTE — Assessment & Plan Note (Signed)
History of previous abnormal pap.  S/p conization.  Most recent paps - ok.  PAP 01/22/13 negative with negative HPV.

## 2014-05-11 NOTE — Assessment & Plan Note (Signed)
Had reclast.  Follow.

## 2014-05-13 ENCOUNTER — Encounter: Payer: Self-pay | Admitting: Internal Medicine

## 2014-06-04 ENCOUNTER — Other Ambulatory Visit: Payer: Self-pay | Admitting: Internal Medicine

## 2014-06-04 DIAGNOSIS — R928 Other abnormal and inconclusive findings on diagnostic imaging of breast: Secondary | ICD-10-CM

## 2014-06-04 NOTE — Progress Notes (Signed)
Order placed for left breast mammogram and ultrasound.

## 2014-07-31 ENCOUNTER — Ambulatory Visit: Payer: BC Managed Care – PPO | Admitting: Internal Medicine

## 2014-08-07 ENCOUNTER — Other Ambulatory Visit: Payer: Self-pay | Admitting: Internal Medicine

## 2014-09-09 ENCOUNTER — Encounter: Payer: Self-pay | Admitting: Internal Medicine

## 2014-09-09 ENCOUNTER — Ambulatory Visit (INDEPENDENT_AMBULATORY_CARE_PROVIDER_SITE_OTHER): Payer: BC Managed Care – PPO | Admitting: Internal Medicine

## 2014-09-09 VITALS — BP 140/80 | HR 77 | Temp 99.0°F | Ht 64.0 in | Wt 137.0 lb

## 2014-09-09 DIAGNOSIS — R7989 Other specified abnormal findings of blood chemistry: Secondary | ICD-10-CM

## 2014-09-09 DIAGNOSIS — Z0000 Encounter for general adult medical examination without abnormal findings: Secondary | ICD-10-CM

## 2014-09-09 DIAGNOSIS — R87619 Unspecified abnormal cytological findings in specimens from cervix uteri: Secondary | ICD-10-CM | POA: Diagnosis not present

## 2014-09-09 DIAGNOSIS — Z658 Other specified problems related to psychosocial circumstances: Secondary | ICD-10-CM

## 2014-09-09 DIAGNOSIS — M25559 Pain in unspecified hip: Secondary | ICD-10-CM | POA: Diagnosis not present

## 2014-09-09 DIAGNOSIS — F439 Reaction to severe stress, unspecified: Secondary | ICD-10-CM

## 2014-09-09 DIAGNOSIS — M810 Age-related osteoporosis without current pathological fracture: Secondary | ICD-10-CM | POA: Diagnosis not present

## 2014-09-09 DIAGNOSIS — E7800 Pure hypercholesterolemia, unspecified: Secondary | ICD-10-CM

## 2014-09-09 DIAGNOSIS — R945 Abnormal results of liver function studies: Secondary | ICD-10-CM

## 2014-09-09 NOTE — Progress Notes (Signed)
Patient ID: Theresa Francis, female   DOB: 1950/05/28, 64 y.o.   MRN: [id number]   Subjective:    Patient ID: Theresa Francis, female    DOB: [birth year], 64 y.o.   MRN: [id number]  HPI  Patient here for a scheduled follow up.  She is continuing to have hip pain right > left.  Saw Dr Jefm Bryant.  Was given meloxicam.  Taking this daily.  Doing yoga and pelvic tilts.  Also taking tylenol - 2 q am.  Still with increased pain.  Affecting her daily activity.  Ready for referral to ortho.  She had abnormal mammogram and is scheduled for her f/u mammogram tomorrow.  No cardiac symptoms with increased activity or exertion.  No sob.  Eating and drinking well.  Bowels stable.     Past Medical History  Diagnosis Date  . History of chicken pox   . History of shingles   . Urine incontinence     H/O occasional  . Abnormal Pap smear     s/p conization.     Outpatient Encounter Prescriptions as of 09/09/2014  Medication Sig  . acetaminophen (TYLENOL) 650 MG CR tablet Take 1,300 mg by mouth every 6 (six) hours as needed for pain.  . Calcium Carbonate-Vitamin D (CALTRATE 600+D PO) Take by mouth daily.  . cetirizine (ZYRTEC) 10 MG tablet Take 10 mg by mouth daily as needed for allergies.  . Glucos-Chondroit-Hyaluron-MSM (GLUCOSAMINE CHONDROITIN JOINT PO) Take by mouth 2 (two) times daily.  . meloxicam (MOBIC) 15 MG tablet Take 15 mg by mouth daily.  Marland Kitchen NAPROXEN SODIUM PO Take by mouth daily.  Marland Kitchen omeprazole (PRILOSEC) 20 MG capsule Take 20 mg by mouth daily.  . sertraline (ZOLOFT) 50 MG tablet TAKE ONE TABLET BY MOUTH EVERY DAY  . [DISCONTINUED] aspirin EC 81 MG tablet Take 81 mg by mouth daily.   No facility-administered encounter medications on file as of 09/09/2014.    Review of Systems  Constitutional: Negative for appetite change and unexpected weight change.  HENT: Negative for congestion and sinus pressure.   Respiratory: Negative for cough, chest tightness and shortness of breath.     Cardiovascular: Negative for chest pain, palpitations and leg swelling.  Gastrointestinal: Negative for nausea, vomiting, abdominal pain and diarrhea.  Genitourinary: Negative for dysuria and difficulty urinating.  Musculoskeletal:       Increased hip pain and pelvic pain.  Persistent.  On meloxicam.    Skin: Negative for color change and rash.  Neurological: Negative for dizziness, light-headedness and headaches.  Psychiatric/Behavioral: Negative for dysphoric mood and agitation.       Objective:    Physical Exam  Constitutional: She appears well-developed and well-nourished. No distress.  HENT:  Nose: Nose normal.  Mouth/Throat: Oropharynx is clear and moist.  Neck: Neck supple. No thyromegaly present.  Cardiovascular: Normal rate and regular rhythm.   Pulmonary/Chest: Breath sounds normal. No respiratory distress. She has no wheezes.  Abdominal: Soft. Bowel sounds are normal. There is no tenderness.  Musculoskeletal: She exhibits no edema or tenderness.  Lymphadenopathy:    She has no cervical adenopathy.  Skin: No rash noted. No erythema.  Psychiatric: She has a normal mood and affect. Her behavior is normal.    BP 140/80 mmHg  Pulse 77  Temp(Src) 99 F (37.2 C) (Oral)  Ht 5\' 4"  (1.626 m)  Wt 137 lb (62.143 kg)  BMI 23.50 kg/m2  SpO2 97% Wt Readings from Last 3 Encounters:  09/09/14 137 lb (62.143 kg)  05/06/14 139 lb 8 oz (63.277 kg)  01/30/14 138 lb 8 oz (62.823 kg)     Lab Results  Component Value Date   WBC 6.4 01/30/2014   HGB 12.9 01/30/2014   HCT 39.7 01/30/2014   PLT 309.0 01/30/2014   GLUCOSE 98 01/30/2014   CHOL 188 05/05/2014   TRIG 53.0 05/05/2014   HDL 66.10 05/05/2014   LDLDIRECT 130.6 11/26/2012   LDLCALC 111* 05/05/2014   ALT 19 05/05/2014   AST 20 05/05/2014   NA 137 01/30/2014   K 4.4 01/30/2014   CL 103 01/30/2014   CREATININE 0.6 01/30/2014   BUN 15 01/30/2014   CO2 26 01/30/2014   TSH 1.60 01/30/2014       Assessment &  Plan:   Problem List Items Addressed This Visit    Abnormal liver function tests    Last liver panel check - wnl.       Abnormal Pap smear of cervix    History of previous abnormal pap.  S/p conizatoin.  Most recent paps ok.  PAP 01/22/13 - negative with negative HPV.        Health care maintenance    Physical 01/30/14.  PAP 01/22/13 - negative with negative HPV.  Colonoscopy 2007.  Recommended f/u in 10 years.  02/03/14 - IFOB negative.  F/u mammogram scheduled tomorrow.        Hip pain - Primary    Persistent pain despite therapy and meloxicam.  Has seen Dr Jefm Bryant.  Affecting her daily activity.  Refer to ortho for further evaluation.        Relevant Orders   Ambulatory referral to Orthopedic Surgery   Hypercholesterolemia    Low cholesterol diet and exercise.  Follow lipid panel.   Lab Results  Component Value Date   CHOL 188 05/05/2014   HDL 66.10 05/05/2014   LDLCALC 111* 05/05/2014   LDLDIRECT 130.6 11/26/2012   TRIG 53.0 05/05/2014   CHOLHDL 3 05/05/2014        Osteoporosis    Had reclast last fall.  Continue treatments.       Stress    On zoloft.  Continue.          I spent 25 minutes with the patient and more than 50% of the time was spent in consultation regarding the above.     Einar Pheasant, MD

## 2014-09-09 NOTE — Progress Notes (Signed)
Pre visit review using our clinic review tool, if applicable. No additional management support is needed unless otherwise documented below in the visit note. 

## 2014-09-10 ENCOUNTER — Other Ambulatory Visit: Payer: Self-pay | Admitting: Internal Medicine

## 2014-09-10 ENCOUNTER — Ambulatory Visit
Admission: RE | Admit: 2014-09-10 | Discharge: 2014-09-10 | Disposition: A | Discharge status: Home / Self Care | Payer: BC Managed Care – PPO | Source: Ambulatory Visit | Attending: Internal Medicine | Admitting: Internal Medicine

## 2014-09-10 ENCOUNTER — Encounter: Payer: Self-pay | Admitting: Internal Medicine

## 2014-09-10 DIAGNOSIS — R928 Other abnormal and inconclusive findings on diagnostic imaging of breast: Secondary | ICD-10-CM

## 2014-09-10 DIAGNOSIS — N63 Unspecified lump in breast: Secondary | ICD-10-CM | POA: Diagnosis not present

## 2014-09-10 DIAGNOSIS — Z09 Encounter for follow-up examination after completed treatment for conditions other than malignant neoplasm: Secondary | ICD-10-CM | POA: Diagnosis present

## 2014-09-10 NOTE — Assessment & Plan Note (Signed)
Last liver panel check - wnl.

## 2014-09-10 NOTE — Assessment & Plan Note (Signed)
Had reclast last fall.  Continue treatments.

## 2014-09-10 NOTE — Assessment & Plan Note (Signed)
Physical 01/30/14.  PAP 01/22/13 - negative with negative HPV.  Colonoscopy 2007.  Recommended f/u in 10 years.  02/03/14 - IFOB negative.  F/u mammogram scheduled tomorrow.

## 2014-09-10 NOTE — Assessment & Plan Note (Signed)
Low cholesterol diet and exercise.  Follow lipid panel.   Lab Results  Component Value Date   CHOL 188 05/05/2014   HDL 66.10 05/05/2014   LDLCALC 111* 05/05/2014   LDLDIRECT 130.6 11/26/2012   TRIG 53.0 05/05/2014   CHOLHDL 3 05/05/2014

## 2014-09-10 NOTE — Assessment & Plan Note (Signed)
Persistent pain despite therapy and meloxicam.  Has seen Dr Jefm Bryant.  Affecting her daily activity.  Refer to ortho for further evaluation.

## 2014-09-10 NOTE — Assessment & Plan Note (Signed)
On zoloft.  Continue.   

## 2014-09-10 NOTE — Assessment & Plan Note (Signed)
History of previous abnormal pap.  S/p conizatoin.  Most recent paps ok.  PAP 01/22/13 - negative with negative HPV.

## 2014-09-11 ENCOUNTER — Encounter: Payer: Self-pay | Admitting: *Deleted

## 2014-09-16 ENCOUNTER — Telehealth: Payer: Self-pay | Admitting: Internal Medicine

## 2014-09-16 NOTE — Telephone Encounter (Signed)
Pt stated that you had called her on Friday.. Please advise pt. Thanks

## 2014-09-17 ENCOUNTER — Other Ambulatory Visit: Payer: Self-pay | Admitting: Internal Medicine

## 2014-09-17 DIAGNOSIS — R928 Other abnormal and inconclusive findings on diagnostic imaging of breast: Secondary | ICD-10-CM

## 2014-09-17 NOTE — Telephone Encounter (Signed)
I returned patient call & documented on previous mychart note.

## 2014-09-17 NOTE — Progress Notes (Signed)
Order placed for referral to Dr Byrnett.   

## 2014-09-24 ENCOUNTER — Ambulatory Visit (INDEPENDENT_AMBULATORY_CARE_PROVIDER_SITE_OTHER): Payer: BC Managed Care – PPO | Admitting: General Surgery

## 2014-09-24 ENCOUNTER — Encounter: Payer: Self-pay | Admitting: General Surgery

## 2014-09-24 VITALS — BP 136/78 | HR 82 | Resp 14 | Ht 64.0 in | Wt 138.0 lb

## 2014-09-24 DIAGNOSIS — R928 Other abnormal and inconclusive findings on diagnostic imaging of breast: Secondary | ICD-10-CM | POA: Diagnosis not present

## 2014-09-24 NOTE — Progress Notes (Addendum)
Patient ID: Theresa Francis, female   DOB: December 02, 1950, 64 y.o.   MRN: [id number]  Chief Complaint  Patient presents with  . Breast Problem    HPI Theresa Francis is a 64 y.o. female who presents for a breast evaluation. The most recent mammogram and left breast ultrasound was done on 09/10/14. She states this was a 6 month follow up mammogram of area that the radiologist had been watching. Patient does perform regular self breast checks and gets regular mammograms done.  She can't feel anything different in the breast. Denies any breast injury or trauma.  HPI  Past Medical History  Diagnosis Date  . History of chicken pox   . History of shingles   . Urine incontinence     H/O occasional  . Abnormal Pap smear     s/p conization.   . Osteoarthritis   . Basal cell carcinoma     eye lid    Past Surgical History  Procedure Laterality Date  . Tonsillectomy  1967  . 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 Colonoscopy  2008    Dr Sonny Masters  . Fine needle aspiration Left 2010    Dr Bary Castilla    Family History  Problem Relation Age of Onset  . Breast cancer Mother 34  . Heart disease Father     Social History History  Substance Use Topics  . Smoking status: Never Smoker   . Smokeless tobacco: Never Used  . Alcohol Use: 0.0 oz/week    0 Standard drinks or equivalent per week     Comment: drinks wine occasionally    No Known Allergies  Current Outpatient Prescriptions  Medication Sig Dispense Refill  . acetaminophen (TYLENOL) 650 MG CR tablet Take 1,300 mg by mouth every 6 (six) hours as needed for pain.    . Calcium Carbonate-Vitamin D (CALTRATE 600+D PO) Take by mouth daily.    . Glucos-Chondroit-Hyaluron-MSM (GLUCOSAMINE CHONDROITIN JOINT PO) Take by mouth 2 (two) times daily.    . meloxicam (MOBIC) 15 MG tablet Take 15 mg by mouth daily.    Marland Kitchen omeprazole (PRILOSEC) 20 MG capsule Take 20 mg by mouth daily.    . sertraline (ZOLOFT) 50 MG tablet TAKE ONE  TABLET BY MOUTH EVERY DAY 30 tablet 2   No current facility-administered medications for this visit.    Review of Systems Review of Systems  Constitutional: Negative.   Respiratory: Negative.   Cardiovascular: Negative.     Blood pressure 136/78, pulse 82, resp. rate 14, height 5\' 4"  (1.626 m), weight 138 lb (62.596 kg).  Physical Exam Physical Exam  Constitutional: She is oriented to person, place, and time. She appears well-developed and well-nourished.  HENT:  Mouth/Throat: Oropharynx is clear and moist.  Eyes: Conjunctivae are normal. No scleral icterus.  Neck: Neck supple.  Cardiovascular: Normal rate, regular rhythm and normal heart sounds.   Pulmonary/Chest: Effort normal and breath sounds normal. Right breast exhibits no inverted nipple, no mass, no nipple discharge, no skin change and no tenderness. Left breast exhibits no inverted nipple, no mass, no nipple discharge, no skin change and no tenderness.  Lymphadenopathy:    She has no cervical adenopathy.    She has no axillary adenopathy.  Neurological: She is alert and oriented to person, place, and time.  Skin: Skin is warm and dry.  Psychiatric: Her behavior is normal.    Data Reviewed Left breast mammogram and ultrasound dated 09/10/2014 reported a palpable  nodule at 1:00 (sees not evident on today's exam.  New nodule superior and lateral to the previously identified lesion at the 3:00 position. Lesion at 1:00 port and 4 x 5 x 5 mm. Complicated cyst at the 6:80 position measuring up to 7 mm. BI-RADS-4.  06/25/2008 office ultrasound: Left breast ultrasound was completed to reassess previously identified cystic lesions. In the 1:00 position 4 cm from the nipple a 0.4 x 0.6 x 0.6 hypoechoic lesion with smooth borders and some acoustic enhancement is identified. Minimal edge effect. Immediately adjacent to this was a small 0.3 cm hypoechoic lesion as well as a 0.28 cm lesion. At the 1:00 position 4 cm from the nipple a  0.3 x 0.3 x 0.44 cm nodular area was identified. This was also hypoechoic with some acoustic enhancement. At the 2:00 position, 4 cm from the nipple a 0.3 cm simple cyst was identified.  2010 mammogram and ultrasound showed indeterminate nodules at the 1 and 2:00 position measuring up to 79 mm in diameter.   Addendum: Date correction  02/02/2009 evaluation: Ultrasound examination of the left breast was undertaken. At the 1:00 position a 0.4 x 0.7 x 0.7 cm slightly lobulated nodule that is isoechoic is identified. At the 2:00 position a small 0.3 x 0.4 x 0.4 smooth isoechoic lesion was a suggestion of acoustic enhancement was seen. At the 3:00 position a slightly lobulated, possibly bilobed lesion that was isoechoic measuring 0.3 x 0.3 x 0.8 cm was seen.  The patient was amenable to FNA sampling. This was completed using 1 cc of 1% plain Xylocaine.. Was aspirated with complete resolution. A small amount of thick clear fluid was obtained and discarded.  The 03/11/2014 and 09/10/2014 left breast ultrasound studies were reviewed. The area at the 1:00 position was not visualized on the earlier study.  The lesion noted on the 2010 exam seems to correlate most directly with lesion at 1:00 on her July thousand 16 [facility] study. No posterior acoustic enhancement was noted at the time of the 2010 exam.    Assessment    Patient with long history of multiple breast nodules, questionable interval change on short-term follow-up.    Plan    Due to the late hour it was elected to defer biopsy at this time. We'll review all the ultrasound images to determine if there is truly interval change since 2010. If so, arrangements will be made for a biopsy in office.     On review of images dating back to 2010, there is no significant change in the upper-outer quadrant left breast. Left breast biopsy can be undertaken for patient reassurance, it ultrasound follow-up in 6 months would be appropriate. This can be arranged  either to the hospital or through this office. The new information will be forwarded to the patient for her input.   PCP:  Nash Shearer 09/25/2014, 6:00 AM

## 2014-09-24 NOTE — Patient Instructions (Addendum)
Continue self breast exams. Call office for any new breast issues or concerns.  Appointment for left breast biopsy.

## 2014-10-01 ENCOUNTER — Encounter: Payer: Self-pay | Admitting: Internal Medicine

## 2014-10-07 ENCOUNTER — Ambulatory Visit: Payer: BC Managed Care – PPO | Admitting: General Surgery

## 2014-10-14 ENCOUNTER — Telehealth: Payer: Self-pay | Admitting: *Deleted

## 2014-10-14 NOTE — Telephone Encounter (Addendum)
-----   Message from Robert Bellow, MD sent at 10/12/2014  1:52 PM EDT ----- Please notify the patient I went back through her old ultrasounds dating back to 2010. I don't see any significant change since that time and the multiple nodules in the left breast. We can complete a biopsy of the area at 1:00 that the radiologist recently ranged a concern about, or we may do a follow-up ultrasound in 6 months. This can either be arranged through this office or by Dr. Nicki Reaper at the hospital. I talked with the patient and she has an appointment with Dr Nicki Reaper at which time she will talk with her about what to do next. She is aware to call us once decisions have been made. Appreciates our phone call.

## 2014-11-03 ENCOUNTER — Ambulatory Visit (INDEPENDENT_AMBULATORY_CARE_PROVIDER_SITE_OTHER): Payer: Medicare Other | Admitting: General Surgery

## 2014-11-03 ENCOUNTER — Ambulatory Visit: Payer: Medicare Other

## 2014-11-03 ENCOUNTER — Encounter: Payer: Self-pay | Admitting: General Surgery

## 2014-11-03 VITALS — BP 130/78 | HR 80 | Resp 14 | Ht 64.0 in | Wt 134.4 lb

## 2014-11-03 DIAGNOSIS — N63 Unspecified lump in breast: Secondary | ICD-10-CM | POA: Diagnosis not present

## 2014-11-03 DIAGNOSIS — N6320 Unspecified lump in the left breast, unspecified quadrant: Secondary | ICD-10-CM | POA: Insufficient documentation

## 2014-11-03 HISTORY — PX: BREAST BIOPSY: SHX20

## 2014-11-03 NOTE — Patient Instructions (Signed)

## 2014-11-03 NOTE — Progress Notes (Signed)
Patient ID: Theresa Francis, female   DOB: [birth year], 64 y.o.   MRN: [id number]  Chief Complaint  Patient presents with  . Procedure    left breast biopsy    HPI Theresa Francis is a 64 y.o. female here for a scheduled left breast biopsy. The patient had been contacted by phone with my prior review of her imaging studies. She is elected to proceed to biopsy to put this issue behind her. The procedure was reviewed. HPI  Past Medical History  Diagnosis Date  . History of chicken pox   . History of shingles   . Urine incontinence     H/O occasional  . Abnormal Pap smear     s/p conization.   . Osteoarthritis   . Basal cell carcinoma     eye lid    Past Surgical History  Procedure Laterality Date  . Tonsillectomy  1967  . 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 Colonoscopy  2008    Dr Sonny Masters  . Fine needle aspiration Left 2010    Dr Bary Castilla    Family History  Problem Relation Age of Onset  . Breast cancer Mother 62  . Heart disease Father     Social History Social History  Substance Use Topics  . Smoking status: Never Smoker   . Smokeless tobacco: Never Used  . Alcohol Use: 0.0 oz/week    0 Standard drinks or equivalent per week     Comment: drinks wine occasionally    No Known Allergies  Current Outpatient Prescriptions  Medication Sig Dispense Refill  . acetaminophen (TYLENOL) 650 MG CR tablet Take 1,300 mg by mouth every 6 (six) hours as needed for pain.    . Calcium Carbonate-Vitamin D (CALTRATE 600+D PO) Take by mouth daily.    . Glucos-Chondroit-Hyaluron-MSM (GLUCOSAMINE CHONDROITIN JOINT PO) Take by mouth 2 (two) times daily.    . meloxicam (MOBIC) 15 MG tablet Take 15 mg by mouth daily.    Marland Kitchen omeprazole (PRILOSEC) 20 MG capsule Take 20 mg by mouth daily.    . sertraline (ZOLOFT) 50 MG tablet TAKE ONE TABLET BY MOUTH EVERY DAY 30 tablet 2   No current facility-administered medications for this visit.    Review of Systems Review  of Systems  Constitutional: Negative.   Respiratory: Negative.   Cardiovascular: Negative.     Blood pressure 130/78, pulse 80, resp. rate 14, height 5\' 4"  (1.626 m), weight 134 lb 6.4 oz (60.963 kg).  Physical Exam Physical Exam  Pulmonary/Chest:      Data Reviewed Ultrasound examination of the left breast in the 1:00 position, 4 cm from the nipple showed a well-defined 0.4 x 0.47 x 0.55 anechoic/hypoechoic smoothly marginated area with some faint posterior acoustic shadowing. This corresponds to the [facility] study.  10 mL of 0.5% Xylocaine with 0.25% Marcaine with 1-200,000 units of epinephrine was utilized well tolerated. A 14-gauge Finesse device was used and pre-and post fire imaging showed the nodule was traversed. Approximately 8 core samples were obtained with near complete resolution of the lesion. A postbiopsy clip was placed. Skin defect was closed with benzoin and Steri-Strips followed by Telfa and Tegaderm dressing. The procedure was well tolerated.  Assessment    Left breast nodule at 1:00, removed post vacuum assisted biopsy.    Plan    wound care instructions were reviewed. The patient will be contacted tomorrow when pathology is available. She will present next week for nursing evaluation.  PCP: Nash Shearer 11/03/2014, 9:14 PM

## 2014-11-04 ENCOUNTER — Telehealth: Payer: Self-pay | Admitting: *Deleted

## 2014-11-04 NOTE — Telephone Encounter (Signed)
Notified patient as instructed, patient pleased. Discussed follow-up appointment, patient agrees. Patient will follow up with PCP for mammograms in the future.

## 2014-11-04 NOTE — Telephone Encounter (Signed)
-----   Message from Robert Bellow, MD sent at 11/04/2014 11:40 AM EDT ----- Please notify the patient biopsy results were fine. Staff follow-up 1 week. Screening mammograms with Dr. Einar Pheasant in one year. ----- Message -----    From: [facility]    Sent: 11/04/2014   9:08 AM      To: Robert Bellow, MD

## 2014-11-10 ENCOUNTER — Ambulatory Visit (INDEPENDENT_AMBULATORY_CARE_PROVIDER_SITE_OTHER): Payer: Medicare Other | Admitting: *Deleted

## 2014-11-10 ENCOUNTER — Other Ambulatory Visit: Payer: Self-pay | Admitting: Internal Medicine

## 2014-11-10 DIAGNOSIS — N63 Unspecified lump in breast: Secondary | ICD-10-CM

## 2014-11-10 DIAGNOSIS — N6320 Unspecified lump in the left breast, unspecified quadrant: Secondary | ICD-10-CM

## 2014-11-10 NOTE — Progress Notes (Signed)
Patient here today for follow up post left breast biopsy.  Dressing removed, steristrip in place and aware it may come off in one week.  Minimal bruising noted.  The patient is aware that a heating pad may be used for comfort as needed.  Aware of pathology. Follow up as scheduled. 

## 2014-11-17 ENCOUNTER — Ambulatory Visit (INDEPENDENT_AMBULATORY_CARE_PROVIDER_SITE_OTHER): Payer: Medicare Other | Admitting: Internal Medicine

## 2014-11-17 ENCOUNTER — Encounter: Payer: Self-pay | Admitting: Internal Medicine

## 2014-11-17 VITALS — BP 138/70 | HR 68 | Temp 98.5°F | Resp 18 | Ht 64.0 in | Wt 136.1 lb

## 2014-11-17 DIAGNOSIS — Z658 Other specified problems related to psychosocial circumstances: Secondary | ICD-10-CM

## 2014-11-17 DIAGNOSIS — R3989 Other symptoms and signs involving the genitourinary system: Secondary | ICD-10-CM

## 2014-11-17 DIAGNOSIS — R928 Other abnormal and inconclusive findings on diagnostic imaging of breast: Secondary | ICD-10-CM

## 2014-11-17 DIAGNOSIS — E7800 Pure hypercholesterolemia, unspecified: Secondary | ICD-10-CM

## 2014-11-17 DIAGNOSIS — R87619 Unspecified abnormal cytological findings in specimens from cervix uteri: Secondary | ICD-10-CM | POA: Diagnosis not present

## 2014-11-17 DIAGNOSIS — K1370 Unspecified lesions of oral mucosa: Secondary | ICD-10-CM

## 2014-11-17 DIAGNOSIS — M810 Age-related osteoporosis without current pathological fracture: Secondary | ICD-10-CM

## 2014-11-17 DIAGNOSIS — F439 Reaction to severe stress, unspecified: Secondary | ICD-10-CM

## 2014-11-17 DIAGNOSIS — M25559 Pain in unspecified hip: Secondary | ICD-10-CM

## 2014-11-17 LAB — URINALYSIS, ROUTINE W REFLEX MICROSCOPIC
Bilirubin Urine: NEGATIVE
Ketones, ur: NEGATIVE
Leukocytes, UA: NEGATIVE
Nitrite: NEGATIVE
Specific Gravity, Urine: 1.01 (ref 1.000–1.030)
TOTAL PROTEIN, URINE-UPE24: NEGATIVE
URINE GLUCOSE: NEGATIVE
UROBILINOGEN UA: 0.2 (ref 0.0–1.0)
pH: 6 (ref 5.0–8.0)

## 2014-11-17 NOTE — Progress Notes (Signed)
Patient ID: Theresa Francis, female   DOB: [birth year], 64 y.o.   MRN: [id number]   Subjective:    Patient ID: Theresa Francis, female    DOB: 04-25-1950, 64 y.o.   MRN: [id number]  HPI  Patient with past history of osteoporosis and hypercholesterolemia with persistent hip pain.  She comes in today to follow up on these issues.  Persistent pain.  Limiting her activity.  Due to see Dr Wynelle Link later this week.  She is also due to see Dr Nilsa Nutting 12/03/14.  She has been doing yoga.  Increased stress related to the above.  Tries to stay active.  No cardiac symptoms with increased activity or exertion.  No sob.  No nausea or vomiting.  Bowels stable.  Has been taking meloxicam.  Saw Dr Bary Castilla.   Left breast biopsy negative.  Recommended f/u in one year.  Some mouth irritation and lesions.  Discussed treatment.  Reports abnormal urine color.     Past Medical History  Diagnosis Date  . History of chicken pox   . History of shingles   . Urine incontinence     H/O occasional  . Abnormal Pap smear     s/p conization.   . Osteoarthritis   . Basal cell carcinoma     eye lid   Past Surgical History  Procedure Laterality Date  . Tonsillectomy  1967  . 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 Colonoscopy  2008    Dr Sonny Masters  . Fine needle aspiration Left 2010    Dr Bary Castilla   Family History  Problem Relation Age of Onset  . Breast cancer Mother 58  . Heart disease Father    Social History   Social History  . Marital Status: Married    Spouse Name: N/A  . Number of Children: 3  . Years of Education: N/A   Social History Main Topics  . Smoking status: Never Smoker   . Smokeless tobacco: Never Used  . Alcohol Use: 0.0 oz/week    0 Standard drinks or equivalent per week     Comment: drinks wine occasionally  . Drug Use: No  . Sexual Activity: Not Asked   Other Topics Concern  . None   Social History Narrative    Outpatient Encounter Prescriptions as of  11/17/2014  Medication Sig  . acetaminophen (TYLENOL) 650 MG CR tablet Take 1,300 mg by mouth every 6 (six) hours as needed for pain.  . Calcium Carbonate-Vitamin D (CALTRATE 600+D PO) Take by mouth daily.  . Glucos-Chondroit-Hyaluron-MSM (GLUCOSAMINE CHONDROITIN JOINT PO) Take by mouth 2 (two) times daily.  . meloxicam (MOBIC) 15 MG tablet Take 15 mg by mouth daily.  Marland Kitchen omeprazole (PRILOSEC) 20 MG capsule Take 20 mg by mouth daily.  . sertraline (ZOLOFT) 50 MG tablet TAKE ONE TABLET BY MOUTH EVERY DAY  . Diphenhyd-Hydrocort-Nystatin (FIRST-DUKES MOUTHWASH) SUSP 5cc's swish and spit tis   No facility-administered encounter medications on file as of 11/17/2014.    Review of Systems  Constitutional: Negative for appetite change and unexpected weight change.  HENT: Negative for sinus pressure.        Mouth lesions as outlined.    Respiratory: Negative for cough, chest tightness and shortness of breath.   Cardiovascular: Negative for chest pain, palpitations and leg swelling.  Gastrointestinal: Negative for nausea, vomiting, abdominal pain and diarrhea.  Musculoskeletal:       Persistent hip pain.  Has f/u appt scheduled with  Dr Wynelle Link and Dr Nilsa Nutting.    Skin: Negative for color change and rash.  Neurological: Negative for dizziness, light-headedness and headaches.  Psychiatric/Behavioral: Negative for dysphoric mood and agitation.       Objective:    Physical Exam  Constitutional: She appears well-developed and well-nourished. No distress.  HENT:  Nose: Nose normal.  Mouth lesions - mucosa and inner lip.    Eyes: Conjunctivae are normal. Right eye exhibits no discharge. Left eye exhibits no discharge.  Neck: Neck supple. No thyromegaly present.  Cardiovascular: Normal rate and regular rhythm.   Pulmonary/Chest: Breath sounds normal. No respiratory distress.  Abdominal: Soft. Bowel sounds are normal. There is no tenderness.  Musculoskeletal: She exhibits no edema or tenderness.    Lymphadenopathy:    She has no cervical adenopathy.  Skin: No rash noted. No erythema.  Psychiatric: She has a normal mood and affect. Her behavior is normal.    BP 138/70 mmHg  Pulse 68  Temp(Src) 98.5 F (36.9 C) (Oral)  Resp 18  Ht 5\' 4"  (1.626 m)  Wt 136 lb 2 oz (61.746 kg)  BMI 23.35 kg/m2  SpO2 97% Wt Readings from Last 3 Encounters:  11/17/14 136 lb 2 oz (61.746 kg)  11/03/14 134 lb 6.4 oz (60.963 kg)  09/24/14 138 lb (62.596 kg)     Lab Results  Component Value Date   WBC 6.4 01/30/2014   HGB 12.9 01/30/2014   HCT 39.7 01/30/2014   PLT 309.0 01/30/2014   GLUCOSE 98 01/30/2014   CHOL 188 05/05/2014   TRIG 53.0 05/05/2014   HDL 66.10 05/05/2014   LDLDIRECT 130.6 11/26/2012   LDLCALC 111* 05/05/2014   ALT 19 05/05/2014   AST 20 05/05/2014   NA 137 01/30/2014   K 4.4 01/30/2014   CL 103 01/30/2014   CREATININE 0.6 01/30/2014   BUN 15 01/30/2014   CO2 26 01/30/2014   TSH 1.60 01/30/2014    US Breast Ltd Uni Left Inc Axilla  09/10/2014   CLINICAL DATA:  Six-month followup of probably benign left breast nodule upper outer quadrant  EXAM: DIGITAL DIAGNOSTIC LEFT MAMMOGRAM WITH 3D TOMOSYNTHESIS WITH CAD  ULTRASOUND LEFT BREAST  COMPARISON:  Previous exam(s).  ACR Breast Density Category b: There are scattered areas of fibroglandular density.  FINDINGS: Three o'clock partially obscured relatively low-attenuation 5 mm nodule stable. New circumscribed 6 mm nodule superior lateral and slightly posterior to this.  Mammographic images were processed with CAD.  On physical exam, there is a 5 mm palpable nodule in the 1 o'clock position of the left breast 4 cm from the nipple.  Targeted ultrasound is performed, showing a circumscribed round hypoechoic mildly heterogeneous nodule with mild posterior acoustic shadowing in the 1 o'clock position of the left breast. No blood flow identified. It measures 5 x 4 x 53mm, and there are several smaller but otherwise very similar adjacent  nodules. In the 3 o'clock position, the probable mildly complicated cyst is stable at 7 x 4 x 6 mm. Left axilla negative.  IMPRESSION: Although the nodule of interest is stable and remains probably benign, there is a new mildly suspicious left breast mass.  RECOMMENDATION: Ultrasound-guided core needle biopsy of the mass in the 1 o'clock position of the left breast recommended. This has been scheduled at the patient's convenience.The probably benign nodule most consistent with a cyst will be reevaluated in six months at the time of the patient's anticipated diagnostic bilateral mammogram and left breast ultrasound.  I have  discussed the findings and recommendations with the patient. Results were also provided in writing at the conclusion of the visit. If applicable, a reminder letter will be sent to the patient regarding the next appointment.  BI-RADS CATEGORY  4: Suspicious.   Electronically Signed   By: Skipper Cliche M.D.   On: 09/10/2014 15:52   Mm Diag Breast Tomo Uni Left  09/10/2014   CLINICAL DATA:  Six-month followup of probably benign left breast nodule upper outer quadrant  EXAM: DIGITAL DIAGNOSTIC LEFT MAMMOGRAM WITH 3D TOMOSYNTHESIS WITH CAD  ULTRASOUND LEFT BREAST  COMPARISON:  Previous exam(s).  ACR Breast Density Category b: There are scattered areas of fibroglandular density.  FINDINGS: Three o'clock partially obscured relatively low-attenuation 5 mm nodule stable. New circumscribed 6 mm nodule superior lateral and slightly posterior to this.  Mammographic images were processed with CAD.  On physical exam, there is a 5 mm palpable nodule in the 1 o'clock position of the left breast 4 cm from the nipple.  Targeted ultrasound is performed, showing a circumscribed round hypoechoic mildly heterogeneous nodule with mild posterior acoustic shadowing in the 1 o'clock position of the left breast. No blood flow identified. It measures 5 x 4 x 31mm, and there are several smaller but otherwise very similar  adjacent nodules. In the 3 o'clock position, the probable mildly complicated cyst is stable at 7 x 4 x 6 mm. Left axilla negative.  IMPRESSION: Although the nodule of interest is stable and remains probably benign, there is a new mildly suspicious left breast mass.  RECOMMENDATION: Ultrasound-guided core needle biopsy of the mass in the 1 o'clock position of the left breast recommended. This has been scheduled at the patient's convenience.The probably benign nodule most consistent with a cyst will be reevaluated in six months at the time of the patient's anticipated diagnostic bilateral mammogram and left breast ultrasound.  I have discussed the findings and recommendations with the patient. Results were also provided in writing at the conclusion of the visit. If applicable, a reminder letter will be sent to the patient regarding the next appointment.  BI-RADS CATEGORY  4: Suspicious.   Electronically Signed   By: Skipper Cliche M.D.   On: 09/10/2014 15:52       Assessment & Plan:   Problem List Items Addressed This Visit    Abnormal mammogram    Mammogram 09/10/14 - Birads IV.  Recommended breast biopsy.  Left breast biopsy negative.  Saw Dr Bary Castilla.  Recommended f/u mammogram in one year.        Abnormal Pap smear of cervix    Previous abnormal pap.  S/p conization.  Most recent pap 01/22/13 negative with negative HPV.       Hip pain    Persistent pain.  Saw Dr Jefm Bryant.  On meloxicam.  Still with increased pain.  Due to see Dr Wynelle Link this week.  Further w/up pending ortho evaluation.        Hypercholesterolemia    Low cholesterol diet and exercise.  Follow lipid panel.        Relevant Orders   Lipid panel   Comprehensive metabolic panel   Mouth lesion    Dukes mouthwash as directed.  Follow.        Osteoporosis    Had reclast last fall.  Follow.        Relevant Orders   Vit D  25 hydroxy (rtn osteoporosis monitoring)   Stress    Increased stress.  Hip contributing.  On  zoloft.   Follow.         Other Visit Diagnoses    Abnormal urine color    -  Primary    Relevant Orders    Urinalysis, Routine w reflex microscopic (not at [facility]) (Completed)    CULTURE, URINE COMPREHENSIVE (Completed)        Einar Pheasant, MD

## 2014-11-17 NOTE — Progress Notes (Signed)
Pre-visit discussion using our clinic review tool. No additional management support is needed unless otherwise documented below in the visit note.  

## 2014-11-18 LAB — CULTURE, URINE COMPREHENSIVE
COLONY COUNT: NO GROWTH
Organism ID, Bacteria: NO GROWTH

## 2014-11-21 ENCOUNTER — Encounter: Payer: Self-pay | Admitting: Internal Medicine

## 2014-11-21 DIAGNOSIS — K1370 Unspecified lesions of oral mucosa: Secondary | ICD-10-CM | POA: Insufficient documentation

## 2014-11-21 MED ORDER — FIRST-DUKES MOUTHWASH MT SUSP: OROMUCOSAL | Status: DC

## 2014-11-21 NOTE — Assessment & Plan Note (Signed)
Had reclast last fall.  Follow.

## 2014-11-21 NOTE — Assessment & Plan Note (Signed)
Increased stress.  Hip contributing.  On zoloft.  Follow.

## 2014-11-21 NOTE — Assessment & Plan Note (Signed)
Low cholesterol diet and exercise.  Follow lipid panel.   

## 2014-11-21 NOTE — Assessment & Plan Note (Signed)
Persistent pain.  Saw Dr Jefm Bryant.  On meloxicam.  Still with increased pain.  Due to see Dr Wynelle Link this week.  Further w/up pending ortho evaluation.

## 2014-11-21 NOTE — Assessment & Plan Note (Signed)
Dukes mouthwash as directed.  Follow.

## 2014-11-21 NOTE — Assessment & Plan Note (Signed)
Previous abnormal pap.  S/p conization.  Most recent pap 01/22/13 negative with negative HPV.

## 2014-11-21 NOTE — Assessment & Plan Note (Signed)
Mammogram 09/10/14 - Birads IV.  Recommended breast biopsy.  Left breast biopsy negative.  Saw Dr Bary Castilla.  Recommended f/u mammogram in one year.

## 2014-12-03 ENCOUNTER — Other Ambulatory Visit (INDEPENDENT_AMBULATORY_CARE_PROVIDER_SITE_OTHER): Payer: Medicare Other

## 2014-12-03 DIAGNOSIS — Z23 Encounter for immunization: Secondary | ICD-10-CM | POA: Diagnosis not present

## 2014-12-03 DIAGNOSIS — M810 Age-related osteoporosis without current pathological fracture: Secondary | ICD-10-CM | POA: Diagnosis not present

## 2014-12-03 DIAGNOSIS — E7800 Pure hypercholesterolemia, unspecified: Secondary | ICD-10-CM

## 2014-12-03 LAB — COMPREHENSIVE METABOLIC PANEL
ALBUMIN: 4.8 g/dL (ref 3.5–5.2)
ALT: 39 U/L — AB (ref 0–35)
AST: 31 U/L (ref 0–37)
Alkaline Phosphatase: 77 U/L (ref 39–117)
BUN: 21 mg/dL (ref 6–23)
CALCIUM: 9.7 mg/dL (ref 8.4–10.5)
CHLORIDE: 102 meq/L (ref 96–112)
CO2: 28 meq/L (ref 19–32)
CREATININE: 0.69 mg/dL (ref 0.40–1.20)
GFR: 90.73 mL/min (ref >60.00)
Glucose, Bld: 100 mg/dL — ABNORMAL HIGH (ref 70–99)
Potassium: 4.3 mEq/L (ref 3.5–5.1)
Sodium: 140 mEq/L (ref 135–145)
Total Bilirubin: 1.3 mg/dL — ABNORMAL HIGH (ref 0.2–1.2)
Total Protein: 7.3 g/dL (ref 6.0–8.3)

## 2014-12-03 LAB — LIPID PANEL
CHOL/HDL RATIO: 4
Cholesterol: 222 mg/dL — ABNORMAL HIGH (ref 0–200)
HDL: 60.7 mg/dL (ref >39.00)
LDL Cholesterol: 139 mg/dL — ABNORMAL HIGH (ref 0–99)
NonHDL: 161.7
TRIGLYCERIDES: 112 mg/dL (ref 0.0–149.0)
VLDL: 22.4 mg/dL (ref 0.0–40.0)

## 2014-12-03 LAB — VITAMIN D 25 HYDROXY (VIT D DEFICIENCY, FRACTURES): VITD: 36.79 ng/mL (ref 30.00–100.00)

## 2014-12-04 ENCOUNTER — Encounter: Payer: Self-pay | Admitting: *Deleted

## 2014-12-18 ENCOUNTER — Other Ambulatory Visit: Payer: Medicare Other

## 2015-02-24 ENCOUNTER — Encounter: Payer: Self-pay | Admitting: Internal Medicine

## 2015-02-24 ENCOUNTER — Ambulatory Visit (INDEPENDENT_AMBULATORY_CARE_PROVIDER_SITE_OTHER): Payer: Medicare Other | Admitting: Internal Medicine

## 2015-02-24 VITALS — BP 120/70 | HR 90 | Temp 98.6°F | Resp 18 | Ht 63.75 in | Wt 135.0 lb

## 2015-02-24 DIAGNOSIS — F439 Reaction to severe stress, unspecified: Secondary | ICD-10-CM

## 2015-02-24 DIAGNOSIS — R5383 Other fatigue: Secondary | ICD-10-CM | POA: Diagnosis not present

## 2015-02-24 DIAGNOSIS — M810 Age-related osteoporosis without current pathological fracture: Secondary | ICD-10-CM

## 2015-02-24 DIAGNOSIS — K1370 Unspecified lesions of oral mucosa: Secondary | ICD-10-CM

## 2015-02-24 DIAGNOSIS — R928 Other abnormal and inconclusive findings on diagnostic imaging of breast: Secondary | ICD-10-CM

## 2015-02-24 DIAGNOSIS — E7800 Pure hypercholesterolemia, unspecified: Secondary | ICD-10-CM

## 2015-02-24 DIAGNOSIS — Z0000 Encounter for general adult medical examination without abnormal findings: Secondary | ICD-10-CM | POA: Diagnosis not present

## 2015-02-24 DIAGNOSIS — M25559 Pain in unspecified hip: Secondary | ICD-10-CM

## 2015-02-24 DIAGNOSIS — R87619 Unspecified abnormal cytological findings in specimens from cervix uteri: Secondary | ICD-10-CM

## 2015-02-24 LAB — CBC WITH DIFFERENTIAL/PLATELET
BASOS ABS: 0 10*3/uL (ref 0.0–0.1)
Basophils Relative: 0.5 % (ref 0.0–3.0)
EOS PCT: 0.5 % (ref 0.0–5.0)
Eosinophils Absolute: 0 10*3/uL (ref 0.0–0.7)
HCT: 38.7 % (ref 36.0–46.0)
HEMOGLOBIN: 12.5 g/dL (ref 12.0–15.0)
Lymphocytes Relative: 31.7 % (ref 12.0–46.0)
Lymphs Abs: 1.9 10*3/uL (ref 0.7–4.0)
MCHC: 32.3 g/dL (ref 30.0–36.0)
MCV: 89.1 fl (ref 78.0–100.0)
MONOS PCT: 7 % (ref 3.0–12.0)
Monocytes Absolute: 0.4 10*3/uL (ref 0.1–1.0)
NEUTROS PCT: 60.3 % (ref 43.0–77.0)
Neutro Abs: 3.6 10*3/uL (ref 1.4–7.7)
Platelets: 353 10*3/uL (ref 150.0–400.0)
RBC: 4.35 Mil/uL (ref 3.87–5.11)
RDW: 13.7 % (ref 11.5–15.5)
WBC: 6 10*3/uL (ref 4.0–10.5)

## 2015-02-24 LAB — TSH: TSH: 0.95 u[IU]/mL (ref 0.35–4.50)

## 2015-02-24 LAB — HEPATIC FUNCTION PANEL
ALBUMIN: 4.9 g/dL (ref 3.5–5.2)
ALK PHOS: 77 U/L (ref 39–117)
ALT: 25 U/L (ref 0–35)
AST: 24 U/L (ref 0–37)
BILIRUBIN DIRECT: 0.2 mg/dL (ref 0.0–0.3)
Total Bilirubin: 1.2 mg/dL (ref 0.2–1.2)
Total Protein: 7.4 g/dL (ref 6.0–8.3)

## 2015-02-24 MED ORDER — FIRST-DUKES MOUTHWASH MT SUSP: OROMUCOSAL | Status: DC

## 2015-02-24 NOTE — Progress Notes (Signed)
Pre-visit discussion using our clinic review tool. No additional management support is needed unless otherwise documented below in the visit note.  

## 2015-02-24 NOTE — Assessment & Plan Note (Signed)
S/p right hip surgery.  Doing well.  Incision site healed well.  Follow.  Continues f/u with ortho.

## 2015-02-24 NOTE — Assessment & Plan Note (Signed)
Probably multifactorial.  Is improving some.  Trying to stay active.  Check cbc and tsh.

## 2015-02-24 NOTE — Assessment & Plan Note (Signed)
Resolves when uses Dukes mouthwash.  Will occasionally have reoccurrence.  Request refill for Dukes.  If persistent reoccurrence, will refer to ENT.

## 2015-02-24 NOTE — Progress Notes (Signed)
Patient ID: Theresa Francis, female   DOB: 1950/11/02, 65 y.o.   MRN: [id number]   Subjective:    Patient ID: Theresa Francis, female    DOB: [birth year], 65 y.o.   MRN: [id number]  HPI  Patient with past history of abnormal pap smear, increased stress and hypercholesterolemia.  She comes in today to follow up on these issues as well as for a complete physical exam.  She is s/p joint (hip) replacement in 12/2014.  She is doing well.  No pain.  Still has issues with the left hip.  States will need left hip surgery as well.  Tries to stay active.  Reports some increased fatigue.  Has noticed since her surgery.  She is becoming more active.  No chest pain or tightness.  No sob.  No acid reflux.  No abdominal pain or cramping.  Bowels stable.     Past Medical History  Diagnosis Date  . History of chicken pox   . History of shingles   . Urine incontinence     H/O occasional  . Abnormal Pap smear     s/p conization.   . Osteoarthritis   . Basal cell carcinoma     eye lid   Past Surgical History  Procedure Laterality Date  . Tonsillectomy  1967  . 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 Colonoscopy  2008    Dr Sonny Masters  . Fine needle aspiration Left 2010    Dr Bary Castilla   Family History  Problem Relation Age of Onset  . Breast cancer Mother 57  . Heart disease Father    Social History   Social History  . Marital Status: Married    Spouse Name: N/A  . Number of Children: 3  . Years of Education: N/A   Social History Main Topics  . Smoking status: Never Smoker   . Smokeless tobacco: Never Used  . Alcohol Use: 0.0 oz/week    0 Standard drinks or equivalent per week     Comment: drinks wine occasionally  . Drug Use: No  . Sexual Activity: Not Asked   Other Topics Concern  . None   Social History Narrative    Outpatient Encounter Prescriptions as of 02/24/2015  Medication Sig  . acetaminophen (TYLENOL) 650 MG CR tablet Take 1,300 mg by mouth every 6 (six)  hours as needed for pain.  . Calcium Carbonate-Vitamin D (CALTRATE 600+D PO) Take by mouth daily.  . meloxicam (MOBIC) 15 MG tablet Take 15 mg by mouth daily.  Marland Kitchen omeprazole (PRILOSEC) 20 MG capsule Take 20 mg by mouth daily.  . sertraline (ZOLOFT) 50 MG tablet TAKE ONE TABLET BY MOUTH EVERY DAY  . [DISCONTINUED] Glucos-Chondroit-Hyaluron-MSM (GLUCOSAMINE CHONDROITIN JOINT PO) Take by mouth 2 (two) times daily.  . Diphenhyd-Hydrocort-Nystatin (FIRST-DUKES MOUTHWASH) SUSP 5cc's swish and spit tis  . [DISCONTINUED] Diphenhyd-Hydrocort-Nystatin (FIRST-DUKES MOUTHWASH) SUSP 5cc's swish and spit tis (Patient not taking: Reported on 02/24/2015)   No facility-administered encounter medications on file as of 02/24/2015.    Review of Systems  Constitutional: Positive for fatigue. Negative for unexpected weight change.  HENT: Negative for congestion and sinus pressure.   Eyes: Negative for pain and visual disturbance.  Respiratory: Negative for cough, chest tightness and shortness of breath.   Cardiovascular: Negative for chest pain, palpitations and leg swelling.  Gastrointestinal: Negative for nausea, vomiting, abdominal pain and diarrhea.  Genitourinary: Negative for dysuria and difficulty urinating.  Musculoskeletal: Negative for back pain  and joint swelling.       S/p hip surgery.  No pain.  Doing well.   Skin: Negative for color change and rash.  Neurological: Negative for dizziness, light-headedness and headaches.  Hematological: Negative for adenopathy. Does not bruise/bleed easily.  Psychiatric/Behavioral: Negative for dysphoric mood and agitation.       Objective:    Physical Exam  Constitutional: She is oriented to person, place, and time. She appears well-developed and well-nourished. No distress.  HENT:  Nose: Nose normal.  Mouth/Throat: Oropharynx is clear and moist.  Eyes: Right eye exhibits no discharge. Left eye exhibits no discharge. No scleral icterus.  Neck: Neck supple. No  thyromegaly present.  Cardiovascular: Normal rate and regular rhythm.   Pulmonary/Chest: Breath sounds normal. No accessory muscle usage. No tachypnea. No respiratory distress. She has no decreased breath sounds. She has no wheezes. She has no rhonchi. Right breast exhibits no inverted nipple, no mass, no nipple discharge and no tenderness (no axillary adenopathy). Left breast exhibits no inverted nipple, no mass, no nipple discharge and no tenderness (no axilarry adenopathy).  Abdominal: Soft. Bowel sounds are normal. There is no tenderness.  Musculoskeletal: She exhibits no edema or tenderness.  Lymphadenopathy:    She has no cervical adenopathy.  Neurological: She is alert and oriented to person, place, and time.  Skin: Skin is warm. No rash noted. No erythema.  Well healed incision site - right hip.   Psychiatric: She has a normal mood and affect. Her behavior is normal.    BP 120/70 mmHg  Pulse 90  Temp(Src) 98.6 F (37 C) (Oral)  Resp 18  Ht 5' 3.75" (1.619 m)  Wt 135 lb (61.236 kg)  BMI 23.36 kg/m2  SpO2 97% Wt Readings from Last 3 Encounters:  02/24/15 135 lb (61.236 kg)  11/17/14 136 lb 2 oz (61.746 kg)  11/03/14 134 lb 6.4 oz (60.963 kg)     Lab Results  Component Value Date   WBC 6.0 02/24/2015   HGB 12.5 02/24/2015   HCT 38.7 02/24/2015   PLT 353.0 02/24/2015   GLUCOSE 100* 12/03/2014   CHOL 222* 12/03/2014   TRIG 112.0 12/03/2014   HDL 60.70 12/03/2014   LDLDIRECT 130.6 11/26/2012   LDLCALC 139* 12/03/2014   ALT 25 02/24/2015   AST 24 02/24/2015   NA 140 12/03/2014   K 4.3 12/03/2014   CL 102 12/03/2014   CREATININE 0.69 12/03/2014   BUN 21 12/03/2014   CO2 28 12/03/2014   TSH 0.95 02/24/2015    US Breast [place] Axilla  09/10/2014  CLINICAL DATA:  Six-month followup of probably benign left breast nodule upper outer quadrant EXAM: DIGITAL DIAGNOSTIC LEFT MAMMOGRAM WITH 3D TOMOSYNTHESIS WITH CAD ULTRASOUND LEFT BREAST COMPARISON:  Previous  exam(s). ACR Breast Density Category b: There are scattered areas of fibroglandular density. FINDINGS: Three o'clock partially obscured relatively low-attenuation 5 mm nodule stable. New circumscribed 6 mm nodule superior lateral and slightly posterior to this. Mammographic images were processed with CAD. On physical exam, there is a 5 mm palpable nodule in the 1 o'clock position of the left breast 4 cm from the nipple. Targeted ultrasound is performed, showing a circumscribed round hypoechoic mildly heterogeneous nodule with mild posterior acoustic shadowing in the 1 o'clock position of the left breast. No blood flow identified. It measures 5 x 4 x 6mm, and there are several smaller but otherwise very similar adjacent nodules. In the 3 o'clock position, the probable mildly complicated cyst is  stable at 7 x 4 x 6 mm. Left axilla negative. IMPRESSION: Although the nodule of interest is stable and remains probably benign, there is a new mildly suspicious left breast mass. RECOMMENDATION: Ultrasound-guided core needle biopsy of the mass in the 1 o'clock position of the left breast recommended. This has been scheduled at the patient's convenience.The probably benign nodule most consistent with a cyst will be reevaluated in six months at the time of the patient's anticipated diagnostic bilateral mammogram and left breast ultrasound. I have discussed the findings and recommendations with the patient. Results were also provided in writing at the conclusion of the visit. If applicable, a reminder letter will be sent to the patient regarding the next appointment. BI-RADS CATEGORY  4: Suspicious. Electronically Signed   By: Skipper Cliche M.D.   On: 09/10/2014 15:52   Mm Diag Breast Tomo Uni Left  09/10/2014  CLINICAL DATA:  Six-month followup of probably benign left breast nodule upper outer quadrant EXAM: DIGITAL DIAGNOSTIC LEFT MAMMOGRAM WITH 3D TOMOSYNTHESIS WITH CAD ULTRASOUND LEFT BREAST COMPARISON:  Previous  exam(s). ACR Breast Density Category b: There are scattered areas of fibroglandular density. FINDINGS: Three o'clock partially obscured relatively low-attenuation 5 mm nodule stable. New circumscribed 6 mm nodule superior lateral and slightly posterior to this. Mammographic images were processed with CAD. On physical exam, there is a 5 mm palpable nodule in the 1 o'clock position of the left breast 4 cm from the nipple. Targeted ultrasound is performed, showing a circumscribed round hypoechoic mildly heterogeneous nodule with mild posterior acoustic shadowing in the 1 o'clock position of the left breast. No blood flow identified. It measures 5 x 4 x 46mm, and there are several smaller but otherwise very similar adjacent nodules. In the 3 o'clock position, the probable mildly complicated cyst is stable at 7 x 4 x 6 mm. Left axilla negative. IMPRESSION: Although the nodule of interest is stable and remains probably benign, there is a new mildly suspicious left breast mass. RECOMMENDATION: Ultrasound-guided core needle biopsy of the mass in the 1 o'clock position of the left breast recommended. This has been scheduled at the patient's convenience.The probably benign nodule most consistent with a cyst will be reevaluated in six months at the time of the patient's anticipated diagnostic bilateral mammogram and left breast ultrasound. I have discussed the findings and recommendations with the patient. Results were also provided in writing at the conclusion of the visit. If applicable, a reminder letter will be sent to the patient regarding the next appointment. BI-RADS CATEGORY  4: Suspicious. Electronically Signed   By: Skipper Cliche M.D.   On: 09/10/2014 15:52       Assessment & Plan:   Problem List Items Addressed This Visit    Abnormal mammogram    Mammogram 09/10/14 - Birads IV.  Recommended breast biopsy.  Left breast biopsy negative.  Saw Dr Bary Castilla.  Recommended f/u mammogram in one year.         Abnormal Pap smear of cervix    Previous abnormal pap.  S/p conization.  Last pap 01/22/13 - negative with negative HPV.       Fatigue - Primary    Probably multifactorial.  Is improving some.  Trying to stay active.  Check cbc and tsh.       Relevant Orders   CBC with Differential/Platelet (Completed)   TSH (Completed)   Health care maintenance    Physical today 02/24/15.  PAP 01/22/13 - negative with negative HPV.  Colonoscopy 2007.  Recommended f/u in 10 years.  Will let her recover from her hip surgery.  Mammogram 09/10/14 - Birads IV.  Saw Dr Bary Castilla.  S/p biopsy - benign.  Recommended f/u mammogram in one year.        Hip pain    S/p right hip surgery.  Doing well.  Incision site healed well.  Follow.  Continues f/u with ortho.        Hyperbilirubinemia    Recheck liver function tests today.        Relevant Orders   Hepatic function panel (Completed)   Hypercholesterolemia    Low cholesterol diet and exercise.  Follow lipid panel and liver function tests.        Mouth lesion    Resolves when uses Dukes mouthwash.  Will occasionally have reoccurrence.  Request refill for Dukes.  If persistent reoccurrence, will refer to ENT.        Osteoporosis    Had reclast last fall.        Stress    Handling stress relatively well.  Feels the zoloft is working well.  Follow.           Einar Pheasant, MD

## 2015-02-24 NOTE — Assessment & Plan Note (Signed)
Recheck liver function tests today.  

## 2015-02-24 NOTE — Assessment & Plan Note (Signed)
Previous abnormal pap.  S/p conization.  Last pap 01/22/13 - negative with negative HPV.

## 2015-02-24 NOTE — Assessment & Plan Note (Signed)
Physical today 02/24/15.  PAP 01/22/13 - negative with negative HPV.  Colonoscopy 2007.  Recommended f/u in 10 years.  Will let her recover from her hip surgery.  Mammogram 09/10/14 - Birads IV.  Saw Dr Bary Castilla.  S/p biopsy - benign.  Recommended f/u mammogram in one year.

## 2015-02-24 NOTE — Assessment & Plan Note (Signed)
Had reclast last fall.

## 2015-02-24 NOTE — Assessment & Plan Note (Signed)
Mammogram 09/10/14 - Birads IV.  Recommended breast biopsy.  Left breast biopsy negative.  Saw Dr Byrnett.  Recommended f/u mammogram in one year.   

## 2015-02-24 NOTE — Assessment & Plan Note (Signed)
Low cholesterol diet and exercise.  Follow lipid panel and liver function tests.  

## 2015-02-24 NOTE — Assessment & Plan Note (Signed)
Handling stress relatively well.  Feels the zoloft is working well.  Follow.

## 2015-02-25 ENCOUNTER — Encounter: Payer: Self-pay | Admitting: *Deleted

## 2015-04-14 ENCOUNTER — Encounter: Payer: Self-pay | Admitting: Family Medicine

## 2015-04-14 ENCOUNTER — Ambulatory Visit (INDEPENDENT_AMBULATORY_CARE_PROVIDER_SITE_OTHER): Payer: Medicare Other | Admitting: Family Medicine

## 2015-04-14 VITALS — BP 114/66 | HR 91 | Temp 98.8°F | Ht 63.75 in | Wt 132.2 lb

## 2015-04-14 DIAGNOSIS — B349 Viral infection, unspecified: Secondary | ICD-10-CM

## 2015-04-14 MED ORDER — HYDROCOD POLST-CPM POLST ER 10-8 MG/5ML PO SUER: 5.0000 mL | Freq: Two times a day (BID) | ORAL | Status: DC | PRN

## 2015-04-14 MED ORDER — PREDNISONE 50 MG PO TABS: ORAL_TABLET | ORAL | Status: DC

## 2015-04-14 NOTE — Assessment & Plan Note (Signed)
New problem. Exam reassuring and nonfocal today. No evidence of bacterial infection. Treating with prednisone and tussionex.

## 2015-04-14 NOTE — Patient Instructions (Addendum)
It was nice to see you today.  This is viral.  Your exam was negative.  Take the prednisone and use the tussionex as needed.   Follow up:  Return if symptoms worsen or fail to improve.  Take care  Dr. Lacinda Axon

## 2015-04-14 NOTE — Progress Notes (Signed)
Subjective:  Patient ID: Theresa Francis, female    DOB: [birth year]  Age: 65 y.o. MRN: [id number]  CC: Malaise, cough, low grade temp, body aches, chills  HPI:  65 year old female presents to clinic today for an acute visit with the above complaints.  Patient states that she's been sick for approximately 1 week.  She has been experiencing cough, low-grade temperature, chills, body aches, facial/dental pain. Cough is her predominant symptom. She is also had significant malaise. Additionally, she reports she's had diarrhea. No known exacerbating or relieving factors. She states that she is somewhat improved today previous days. No other associated symptoms. No interventions tried.  Social Hx   Social History   Social History  . Marital Status: Married    Spouse Name: N/A  . Number of Children: 3  . Years of Education: N/A   Social History Main Topics  . Smoking status: Never Smoker   . Smokeless tobacco: Never Used  . Alcohol Use: 0.0 oz/week    0 Standard drinks or equivalent per week     Comment: drinks wine occasionally  . Drug Use: No  . Sexual Activity: Not Asked   Other Topics Concern  . None   Social History Narrative   Review of Systems  Constitutional: Positive for fatigue.  HENT: Positive for congestion.        Facial/dental pain.  Respiratory: Positive for cough.   Musculoskeletal:       Body aches.    Objective:  BP 114/66 mmHg  Pulse 91  Temp(Src) 98.8 F (37.1 C) (Oral)  Ht 5' 3.75" (1.619 m)  Wt 132 lb 4 oz (59.988 kg)  BMI 22.89 kg/m2  SpO2 96%  BP/Weight 04/14/2015 02/24/2015 [id number]  Systolic BP [id number]  Diastolic BP 66 70 70  Wt. (Lbs) 132.25 135 136.13  BMI 22.89 23.36 23.35   Physical Exam  Constitutional: She is oriented to person, place, and time. She appears well-developed. No distress.  HENT:  Head: Normocephalic and atraumatic.  Mouth/Throat: Oropharynx is clear and moist.  Normal TM's bilaterally.  No maxillary sinus  tenderness.   Cardiovascular: Normal rate and regular rhythm.   Pulmonary/Chest: Effort normal and breath sounds normal. No respiratory distress. She has no wheezes. She has no rales.  Neurological: She is alert and oriented to person, place, and time.  Psychiatric: She has a normal mood and affect.  Vitals reviewed.  Lab Results  Component Value Date   WBC 6.0 02/24/2015   HGB 12.5 02/24/2015   HCT 38.7 02/24/2015   PLT 353.0 02/24/2015   GLUCOSE 100* 12/03/2014   CHOL 222* 12/03/2014   TRIG 112.0 12/03/2014   HDL 60.70 12/03/2014   LDLDIRECT 130.6 11/26/2012   LDLCALC 139* 12/03/2014   ALT 25 02/24/2015   AST 24 02/24/2015   NA 140 12/03/2014   K 4.3 12/03/2014   CL 102 12/03/2014   CREATININE 0.69 12/03/2014   BUN 21 12/03/2014   CO2 28 12/03/2014   TSH 0.95 02/24/2015    Assessment & Plan:   Problem List Items Addressed This Visit    Viral illness - Primary    New problem. Exam reassuring and nonfocal today. No evidence of bacterial infection. Treating with prednisone and tussionex.          Meds ordered this encounter  Medications  . predniSONE (DELTASONE) 50 MG tablet    Sig: 1 tablet daily x 5 days.    Dispense:  5 tablet    Refill:  0  . chlorpheniramine-HYDROcodone (TUSSIONEX PENNKINETIC ER) 10-8 MG/5ML SUER    Sig: Take 5 mLs by mouth every 12 (twelve) hours as needed.    Dispense:  115 mL    Refill:  0   Follow-up: Return if symptoms worsen or fail to improve.  [place]

## 2015-04-14 NOTE — Progress Notes (Signed)
Pre visit review using our clinic review tool, if applicable. No additional management support is needed unless otherwise documented below in the visit note. 

## 2015-04-30 ENCOUNTER — Telehealth: Payer: Self-pay

## 2015-04-30 NOTE — Telephone Encounter (Signed)
Pt returned to the office today and stated that she was having pain in upper gums, pt saw a dentist who stated that they didi not think root canal was the solution. Pt saw Dr. Lacinda Axon 10 days ago and was given 5 days of prednisone. Pt is still c/o cough and congestion. Pt was scheduled to come in and see Dr. Caryl Bis next week.

## 2015-05-05 ENCOUNTER — Encounter: Payer: Self-pay | Admitting: Family Medicine

## 2015-05-05 ENCOUNTER — Ambulatory Visit (INDEPENDENT_AMBULATORY_CARE_PROVIDER_SITE_OTHER): Payer: Medicare Other | Admitting: Family Medicine

## 2015-05-05 VITALS — BP 134/82 | HR 80 | Temp 98.1°F | Ht 63.75 in | Wt 138.0 lb

## 2015-05-05 DIAGNOSIS — J0100 Acute maxillary sinusitis, unspecified: Secondary | ICD-10-CM | POA: Diagnosis not present

## 2015-05-05 MED ORDER — AMOXICILLIN-POT CLAVULANATE 875-125 MG PO TABS: 1.0000 | ORAL_TABLET | Freq: Two times a day (BID) | ORAL | Status: DC

## 2015-05-05 NOTE — Assessment & Plan Note (Signed)
Symptoms most consistent with bacterial sinusitis. Given duration and persistence we will treat with Augmentin. She can use Claritin and Flonase over-the-counter. Advised on probiotics. She'll continue to monitor. She's given return precautions.

## 2015-05-05 NOTE — Patient Instructions (Signed)
Nice to meet you. You have a sinus infection. We will treat this with augmentin. Please take a probiotic while on this to limit stomach upset.  You can also take claritin and flonase over the counter for this. If you develop fever, shortness of breath, chest pain, cough productive of blood, or any new or change in symptoms please seek medical attention.

## 2015-05-05 NOTE — Progress Notes (Signed)
Patient ID: TRINNIE CARUSO, female   DOB: Jul 17, 1950, 65 y.o.   MRN: [id number]  Tommi Rumps, MD Phone: [phone]  Theresa Francis is a 65 y.o. female who presents today for same day appointment.   Sinus infection: patient reports onset of symptoms 1 month ago. Was seen by Dr Lacinda Axon for similar symptoms at that time and diagnosed with a viral illness. Was treated with prednisone and noted she did improve some of that time though worsened after this stopped. She was evaluated by her dentist for left-sided tooth pain related to this and nothing was found to be wrong with her teeth. She notes persistent maxillary sinus pressure left greater than right with teeth pain. No fevers. She's not able to get much out of her nose. Some clear mucus from her throat. Minimal cough. No ear discomfort. Some postnasal drip. She's been taking some Tylenol for this. Overall has not improved.  PMH: nonsmoker.   ROS see history of present illness  Objective  Physical Exam Filed Vitals:   05/05/15 0758  BP: 134/82  Pulse: 80  Temp: 98.1 F (36.7 C)    BP Readings from Last 3 Encounters:  05/05/15 134/82  04/14/15 114/66  02/24/15 120/70   Wt Readings from Last 3 Encounters:  05/05/15 138 lb (62.596 kg)  04/14/15 132 lb 4 oz (59.988 kg)  02/24/15 135 lb (61.236 kg)    Physical Exam  Constitutional: She is well-developed, well-nourished, and in no distress.  HENT:  Head: Normocephalic and atraumatic.  Right Ear: External ear normal.  Left Ear: External ear normal.  Mouth/Throat: No oropharyngeal exudate.  Mild posterior oropharyngeal erythema, normal TMs bilaterally  Eyes: Conjunctivae are normal. Pupils are equal, round, and reactive to light.  Neck: Neck supple.  Cardiovascular: Normal rate, regular rhythm and normal heart sounds.   Pulmonary/Chest: Effort normal and breath sounds normal.  Lymphadenopathy:    She has no cervical adenopathy.  Neurological: She is alert. Gait  normal.  Skin: Skin is warm and dry. She is not diaphoretic.     Assessment/Plan: Please see individual problem list.  Maxillary sinusitis, acute Symptoms most consistent with bacterial sinusitis. Given duration and persistence we will treat with Augmentin. She can use Claritin and Flonase over-the-counter. Advised on probiotics. She'll continue to monitor. She's given return precautions.    No orders of the defined types were placed in this encounter.    Meds ordered this encounter  Medications  . amoxicillin-clavulanate (AUGMENTIN) 875-125 MG tablet    Sig: Take 1 tablet by mouth 2 (two) times daily.    Dispense:  14 tablet    Refill:  0    Tommi Rumps, MD [place]

## 2015-05-05 NOTE — Progress Notes (Signed)
Pre visit review using our clinic review tool, if applicable. No additional management support is needed unless otherwise documented below in the visit note. 

## 2015-06-24 ENCOUNTER — Ambulatory Visit (INDEPENDENT_AMBULATORY_CARE_PROVIDER_SITE_OTHER): Payer: Medicare Other | Admitting: Internal Medicine

## 2015-06-24 ENCOUNTER — Encounter: Payer: Self-pay | Admitting: Internal Medicine

## 2015-06-24 VITALS — BP 130/70 | HR 77 | Temp 98.6°F | Resp 18 | Ht 63.75 in | Wt 135.5 lb

## 2015-06-24 DIAGNOSIS — R928 Other abnormal and inconclusive findings on diagnostic imaging of breast: Secondary | ICD-10-CM

## 2015-06-24 DIAGNOSIS — Z658 Other specified problems related to psychosocial circumstances: Secondary | ICD-10-CM | POA: Diagnosis not present

## 2015-06-24 DIAGNOSIS — E7800 Pure hypercholesterolemia, unspecified: Secondary | ICD-10-CM

## 2015-06-24 DIAGNOSIS — M810 Age-related osteoporosis without current pathological fracture: Secondary | ICD-10-CM

## 2015-06-24 DIAGNOSIS — M25559 Pain in unspecified hip: Secondary | ICD-10-CM

## 2015-06-24 DIAGNOSIS — F439 Reaction to severe stress, unspecified: Secondary | ICD-10-CM

## 2015-06-24 NOTE — Progress Notes (Signed)
Patient ID: Theresa Francis, female   DOB: 11-Apr-1950, 65 y.o.   MRN: [id number]   Subjective:    Patient ID: Theresa Francis, female    DOB: [birth year], 65 y.o.   MRN: [id number]  HPI  Patient here for a scheduled follow up.  She is s/p right hip replacement in 12/2014.  Getting around much better now.  More active.  Feels better.  Tries to stay active.  No cardiac symptoms with increased activity or exertion.  No sob.  No acid reflux.  No abdominal pain or cramping.  Bowels stable.  Discussed f/u on mammogram.  S/p biopsy.  Biopsy ok.  Appears to be overdue mammogram.     Past Medical History  Diagnosis Date  . History of chicken pox   . History of shingles   . Urine incontinence     H/O occasional  . Abnormal Pap smear     s/p conization.   . Osteoarthritis   . Basal cell carcinoma     eye lid   Past Surgical History  Procedure Laterality Date  . Tonsillectomy  1967  . 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 Colonoscopy  2008    Dr Sonny Masters  . Fine needle aspiration Left 2010    Dr Bary Castilla   Family History  Problem Relation Age of Onset  . Breast cancer Mother 80  . Heart disease Father    Social History   Social History  . Marital Status: Married    Spouse Name: N/A  . Number of Children: 3  . Years of Education: N/A   Social History Main Topics  . Smoking status: Never Smoker   . Smokeless tobacco: Never Used  . Alcohol Use: 0.0 oz/week    0 Standard drinks or equivalent per week     Comment: drinks wine occasionally  . Drug Use: No  . Sexual Activity: Not Asked   Other Topics Concern  . None   Social History Narrative    Outpatient Encounter Prescriptions as of 06/24/2015  Medication Sig  . acetaminophen (TYLENOL) 650 MG CR tablet Take 1,300 mg by mouth every 6 (six) hours as needed for pain.  . Calcium Carbonate-Vitamin D (CALTRATE 600+D PO) Take by mouth daily.  . Diphenhyd-Hydrocort-Nystatin (FIRST-DUKES MOUTHWASH) SUSP 5cc's swish  and spit tis  . meloxicam (MOBIC) 15 MG tablet Take 15 mg by mouth daily.  Marland Kitchen omeprazole (PRILOSEC) 20 MG capsule Take 20 mg by mouth daily.  . sertraline (ZOLOFT) 50 MG tablet TAKE ONE TABLET BY MOUTH EVERY DAY  . [DISCONTINUED] amoxicillin-clavulanate (AUGMENTIN) 875-125 MG tablet Take 1 tablet by mouth 2 (two) times daily. (Patient not taking: Reported on 06/24/2015)  . [DISCONTINUED] chlorpheniramine-HYDROcodone (TUSSIONEX PENNKINETIC ER) 10-8 MG/5ML SUER Take 5 mLs by mouth every 12 (twelve) hours as needed. (Patient not taking: Reported on 06/24/2015)   No facility-administered encounter medications on file as of 06/24/2015.    Review of Systems  Constitutional: Negative for appetite change and unexpected weight change.  HENT: Negative for congestion and sinus pressure.   Respiratory: Negative for cough, chest tightness and shortness of breath.   Cardiovascular: Negative for chest pain, palpitations and leg swelling.  Gastrointestinal: Negative for nausea, vomiting, abdominal pain and diarrhea.  Genitourinary: Negative for dysuria and difficulty urinating.  Musculoskeletal:       Right hip better.  Some occasional issues with the left, but overall feels much better.    Skin: Negative for color change  and rash.  Neurological: Negative for dizziness, light-headedness and headaches.  Psychiatric/Behavioral: Negative for dysphoric mood and agitation.       Objective:    Physical Exam  Constitutional: She appears well-developed and well-nourished. No distress.  HENT:  Nose: Nose normal.  Mouth/Throat: Oropharynx is clear and moist.  Neck: Neck supple. No thyromegaly present.  Cardiovascular: Normal rate and regular rhythm.   Pulmonary/Chest: Breath sounds normal. No respiratory distress. She has no wheezes.  Abdominal: Soft. Bowel sounds are normal. There is no tenderness.  Musculoskeletal: She exhibits no edema or tenderness.  Lymphadenopathy:    She has no cervical adenopathy.    Skin: No rash noted. No erythema.  Psychiatric: She has a normal mood and affect. Her behavior is normal.    BP 130/70 mmHg  Pulse 77  Temp(Src) 98.6 F (37 C) (Oral)  Resp 18  Ht 5' 3.75" (1.619 m)  Wt 135 lb 8 oz (61.462 kg)  BMI 23.45 kg/m2  SpO2 95% Wt Readings from Last 3 Encounters:  06/24/15 135 lb 8 oz (61.462 kg)  05/05/15 138 lb (62.596 kg)  04/14/15 132 lb 4 oz (59.988 kg)     Lab Results  Component Value Date   WBC 6.0 02/24/2015   HGB 12.5 02/24/2015   HCT 38.7 02/24/2015   PLT 353.0 02/24/2015   GLUCOSE 100* 12/03/2014   CHOL 222* 12/03/2014   TRIG 112.0 12/03/2014   HDL 60.70 12/03/2014   LDLDIRECT 130.6 11/26/2012   LDLCALC 139* 12/03/2014   ALT 25 02/24/2015   AST 24 02/24/2015   NA 140 12/03/2014   K 4.3 12/03/2014   CL 102 12/03/2014   CREATININE 0.69 12/03/2014   BUN 21 12/03/2014   CO2 28 12/03/2014   TSH 0.95 02/24/2015    US Breast [place] [facility]  09/10/2014  CLINICAL DATA:  Six-month followup of probably benign left breast nodule upper outer quadrant EXAM: DIGITAL DIAGNOSTIC LEFT MAMMOGRAM WITH 3D TOMOSYNTHESIS WITH CAD ULTRASOUND LEFT BREAST COMPARISON:  Previous exam(s). ACR Breast Density Category b: There are scattered areas of fibroglandular density. FINDINGS: Three o'clock partially obscured relatively low-attenuation 5 mm nodule stable. New circumscribed 6 mm nodule superior lateral and slightly posterior to this. Mammographic images were processed with CAD. On physical exam, there is a 5 mm palpable nodule in the 1 o'clock position of the left breast 4 cm from the nipple. Targeted ultrasound is performed, showing a circumscribed round hypoechoic mildly heterogeneous nodule with mild posterior acoustic shadowing in the 1 o'clock position of the left breast. No blood flow identified. It measures 5 x 4 x 72mm, and there are several smaller but otherwise very similar adjacent nodules. In the 3 o'clock position, the probable mildly  complicated cyst is stable at 7 x 4 x 6 mm. Left axilla negative. IMPRESSION: Although the nodule of interest is stable and remains probably benign, there is a new mildly suspicious left breast mass. RECOMMENDATION: Ultrasound-guided core needle biopsy of the mass in the 1 o'clock position of the left breast recommended. This has been scheduled at the patient's convenience.The probably benign nodule most consistent with a cyst will be reevaluated in six months at the time of the patient's anticipated diagnostic bilateral mammogram and left breast ultrasound. I have discussed the findings and recommendations with the patient. Results were also provided in writing at the conclusion of the visit. If applicable, a reminder letter will be sent to the patient regarding the next appointment. BI-RADS CATEGORY  4: Suspicious.  Electronically Signed   By: Skipper Cliche M.D.   On: 09/10/2014 15:52   Mm Diag Breast [facility] Left  09/10/2014  CLINICAL DATA:  Six-month followup of probably benign left breast nodule upper outer quadrant EXAM: DIGITAL DIAGNOSTIC LEFT MAMMOGRAM WITH 3D TOMOSYNTHESIS WITH CAD ULTRASOUND LEFT BREAST COMPARISON:  Previous exam(s). ACR Breast Density Category b: There are scattered areas of fibroglandular density. FINDINGS: Three o'clock partially obscured relatively low-attenuation 5 mm nodule stable. New circumscribed 6 mm nodule superior lateral and slightly posterior to this. Mammographic images were processed with CAD. On physical exam, there is a 5 mm palpable nodule in the 1 o'clock position of the left breast 4 cm from the nipple. Targeted ultrasound is performed, showing a circumscribed round hypoechoic mildly heterogeneous nodule with mild posterior acoustic shadowing in the 1 o'clock position of the left breast. No blood flow identified. It measures 5 x 4 x 58mm, and there are several smaller but otherwise very similar adjacent nodules. In the 3 o'clock position, the probable mildly  complicated cyst is stable at 7 x 4 x 6 mm. Left axilla negative. IMPRESSION: Although the nodule of interest is stable and remains probably benign, there is a new mildly suspicious left breast mass. RECOMMENDATION: Ultrasound-guided core needle biopsy of the mass in the 1 o'clock position of the left breast recommended. This has been scheduled at the patient's convenience.The probably benign nodule most consistent with a cyst will be reevaluated in six months at the time of the patient's anticipated diagnostic bilateral mammogram and left breast ultrasound. I have discussed the findings and recommendations with the patient. Results were also provided in writing at the conclusion of the visit. If applicable, a reminder letter will be sent to the patient regarding the next appointment. BI-RADS CATEGORY  4: Suspicious. Electronically Signed   By: Skipper Cliche M.D.   On: 09/10/2014 15:52       Assessment & Plan:   Problem List Items Addressed This Visit    Abnormal mammogram - Primary    Mammogram 09/10/14 - [facility].  Recommended breast biopsy.  Saw Dr Bary Castilla.  Biopsy ok.  Overdue f/u bilateral mammogram.  Ordered.        Relevant Orders   MM Digital Diagnostic Bilat   US BREAST LTD UNI LEFT INC AXILLA   US BREAST LTD UNI RIGHT INC AXILLA   Hip pain    S/p right hip surgery.  Doing well.  Feels better.  Follow.       Hyperbilirubinemia    Follow liver function tests.  Liver panel 02/24/15 - wnl.       Relevant Orders   Comprehensive metabolic panel   Hypercholesterolemia    Low cholesterol diet and exercise.  Follow lipid panel.        Relevant Orders   Lipid panel   Osteoporosis    Had reclast last fall.  F/u with rheumatology.       Stress    On zoloft and this is working well for her.  Better.  Follow.            Einar Pheasant, MD

## 2015-06-24 NOTE — Progress Notes (Signed)
Pre-visit discussion using our clinic review tool. No additional management support is needed unless otherwise documented below in the visit note.  

## 2015-06-25 ENCOUNTER — Encounter: Payer: Self-pay | Admitting: Internal Medicine

## 2015-06-25 NOTE — Assessment & Plan Note (Signed)
S/p right hip surgery.  Doing well.  Feels better.  Follow.

## 2015-06-25 NOTE — Assessment & Plan Note (Signed)
On zoloft and this is working well for her.  Better.  Follow.

## 2015-06-25 NOTE — Assessment & Plan Note (Signed)
Follow liver function tests.  Liver panel 02/24/15 - wnl.

## 2015-06-25 NOTE — Assessment & Plan Note (Signed)
Mammogram 09/10/14 - [facility] IV.  Recommended breast biopsy.  Saw Dr Bary Castilla.  Biopsy ok.  Overdue f/u bilateral mammogram.  Ordered.

## 2015-06-25 NOTE — Assessment & Plan Note (Signed)
Low cholesterol diet and exercise.  Follow lipid panel.   

## 2015-06-25 NOTE — Assessment & Plan Note (Signed)
Had reclast last fall.  F/u with rheumatology.

## 2015-07-10 ENCOUNTER — Other Ambulatory Visit (INDEPENDENT_AMBULATORY_CARE_PROVIDER_SITE_OTHER): Payer: Medicare Other

## 2015-07-10 ENCOUNTER — Telehealth: Payer: Self-pay | Admitting: *Deleted

## 2015-07-10 DIAGNOSIS — M810 Age-related osteoporosis without current pathological fracture: Secondary | ICD-10-CM

## 2015-07-10 DIAGNOSIS — E7800 Pure hypercholesterolemia, unspecified: Secondary | ICD-10-CM

## 2015-07-10 LAB — LIPID PANEL
Cholesterol: 207 mg/dL — ABNORMAL HIGH (ref 0–200)
HDL: 58.9 mg/dL (ref >39.00)
LDL CALC: 135 mg/dL — AB (ref 0–99)
NONHDL: 148.49
Total CHOL/HDL Ratio: 4
Triglycerides: 67 mg/dL (ref 0.0–149.0)
VLDL: 13.4 mg/dL (ref 0.0–40.0)

## 2015-07-10 LAB — COMPREHENSIVE METABOLIC PANEL
ALBUMIN: 4.8 g/dL (ref 3.5–5.2)
ALT: 15 U/L (ref 0–35)
AST: 17 U/L (ref 0–37)
Alkaline Phosphatase: 71 U/L (ref 39–117)
BUN: 15 mg/dL (ref 6–23)
CHLORIDE: 106 meq/L (ref 96–112)
CO2: 28 mEq/L (ref 19–32)
Calcium: 9.5 mg/dL (ref 8.4–10.5)
Creatinine, Ser: 0.65 mg/dL (ref 0.40–1.20)
GFR: 97.02 mL/min (ref >60.00)
GLUCOSE: 97 mg/dL (ref 70–99)
POTASSIUM: 4.3 meq/L (ref 3.5–5.1)
Sodium: 142 mEq/L (ref 135–145)
Total Bilirubin: 1.6 mg/dL — ABNORMAL HIGH (ref 0.2–1.2)
Total Protein: 6.8 g/dL (ref 6.0–8.3)

## 2015-07-10 LAB — VITAMIN D 25 HYDROXY (VIT D DEFICIENCY, FRACTURES): VITD: 30.86 ng/mL (ref 30.00–100.00)

## 2015-07-10 NOTE — Telephone Encounter (Signed)
orer placed for vitamin D

## 2015-07-10 NOTE — Telephone Encounter (Signed)
Pt would like to add vit d 

## 2015-07-10 NOTE — Telephone Encounter (Signed)
Sending add on over

## 2015-09-11 ENCOUNTER — Ambulatory Visit
Admission: RE | Admit: 2015-09-11 | Discharge: 2015-09-11 | Disposition: A | Discharge status: Home / Self Care | Payer: Medicare Other | Source: Ambulatory Visit | Attending: Internal Medicine | Admitting: Internal Medicine

## 2015-09-11 ENCOUNTER — Other Ambulatory Visit: Payer: Self-pay | Admitting: Internal Medicine

## 2015-09-11 DIAGNOSIS — R928 Other abnormal and inconclusive findings on diagnostic imaging of breast: Secondary | ICD-10-CM

## 2015-09-11 DIAGNOSIS — N63 Unspecified lump in breast: Secondary | ICD-10-CM | POA: Diagnosis not present

## 2015-09-11 DIAGNOSIS — Z1231 Encounter for screening mammogram for malignant neoplasm of breast: Secondary | ICD-10-CM

## 2015-10-28 ENCOUNTER — Ambulatory Visit (INDEPENDENT_AMBULATORY_CARE_PROVIDER_SITE_OTHER): Payer: Medicare Other | Admitting: Internal Medicine

## 2015-10-28 ENCOUNTER — Encounter: Payer: Self-pay | Admitting: Internal Medicine

## 2015-10-28 ENCOUNTER — Encounter (INDEPENDENT_AMBULATORY_CARE_PROVIDER_SITE_OTHER): Payer: Self-pay

## 2015-10-28 VITALS — BP 122/60 | HR 82 | Temp 98.6°F | Ht 64.0 in | Wt 140.8 lb

## 2015-10-28 DIAGNOSIS — M810 Age-related osteoporosis without current pathological fracture: Secondary | ICD-10-CM

## 2015-10-28 DIAGNOSIS — R945 Abnormal results of liver function studies: Secondary | ICD-10-CM

## 2015-10-28 DIAGNOSIS — R7989 Other specified abnormal findings of blood chemistry: Secondary | ICD-10-CM

## 2015-10-28 DIAGNOSIS — Z658 Other specified problems related to psychosocial circumstances: Secondary | ICD-10-CM

## 2015-10-28 DIAGNOSIS — M25559 Pain in unspecified hip: Secondary | ICD-10-CM

## 2015-10-28 DIAGNOSIS — E7800 Pure hypercholesterolemia, unspecified: Secondary | ICD-10-CM | POA: Diagnosis not present

## 2015-10-28 DIAGNOSIS — F439 Reaction to severe stress, unspecified: Secondary | ICD-10-CM

## 2015-10-28 DIAGNOSIS — Z23 Encounter for immunization: Secondary | ICD-10-CM

## 2015-10-28 DIAGNOSIS — R87619 Unspecified abnormal cytological findings in specimens from cervix uteri: Secondary | ICD-10-CM

## 2015-10-28 DIAGNOSIS — R928 Other abnormal and inconclusive findings on diagnostic imaging of breast: Secondary | ICD-10-CM

## 2015-10-28 LAB — LIPID PANEL
Cholesterol: 199 mg/dL (ref 0–200)
HDL: 65.6 mg/dL
LDL Cholesterol: 119 mg/dL — ABNORMAL HIGH (ref 0–99)
NonHDL: 133.58
Total CHOL/HDL Ratio: 3
Triglycerides: 72 mg/dL (ref 0.0–149.0)
VLDL: 14.4 mg/dL (ref 0.0–40.0)

## 2015-10-28 LAB — HEPATIC FUNCTION PANEL
ALT: 16 U/L (ref 0–35)
AST: 18 U/L (ref 0–37)
Albumin: 4.5 g/dL (ref 3.5–5.2)
Alkaline Phosphatase: 68 U/L (ref 39–117)
Bilirubin, Direct: 0.3 mg/dL (ref 0.0–0.3)
Total Bilirubin: 1.7 mg/dL — ABNORMAL HIGH (ref 0.2–1.2)
Total Protein: 7.1 g/dL (ref 6.0–8.3)

## 2015-10-28 LAB — URINALYSIS, ROUTINE W REFLEX MICROSCOPIC
Bilirubin Urine: NEGATIVE
Ketones, ur: NEGATIVE
Leukocytes, UA: NEGATIVE
Nitrite: NEGATIVE
Specific Gravity, Urine: 1.025
Urine Glucose: NEGATIVE
Urobilinogen, UA: 0.2
pH: 6 (ref 5.0–8.0)

## 2015-10-28 LAB — BASIC METABOLIC PANEL
BUN: 16 mg/dL (ref 6–23)
CO2: 30 meq/L (ref 19–32)
Calcium: 9.1 mg/dL (ref 8.4–10.5)
Chloride: 106 mEq/L (ref 96–112)
Creatinine, Ser: 0.62 mg/dL (ref 0.40–1.20)
GFR: 102.36 mL/min (ref >60.00)
GLUCOSE: 103 mg/dL — AB (ref 70–99)
POTASSIUM: 4.1 meq/L (ref 3.5–5.1)
SODIUM: 140 meq/L (ref 135–145)

## 2015-10-28 NOTE — Progress Notes (Signed)
Patient ID: Theresa Francis, female   DOB: 13-Aug-1950, 65 y.o.   MRN: [id number]   Subjective:    Patient ID: Theresa Francis, female    DOB: 01-Jul-1950, 65 y.o.   MRN: [id number]  HPI  Patient here for a scheduled follow up.  She reports she is doing relatively well.  Just had f/u mammogram 09/11/15.  Birads III.  Recommended f/u diagnostic mammogram in one year.  Right hip has been doing better since her surgery.  Having some left hip pain now.  S/p injection.  Did not help a lot.  Tries to stay active.  No chest pain.  No sob.  No acid reflux.  No abdominal pain or cramping.  Bowels stable.  Discussed reclast.  She is due.     Past Medical History:  Diagnosis Date  . Abnormal Pap smear    s/p conization.   . Basal cell carcinoma    eye lid  . History of chicken pox   . History of shingles   . Osteoarthritis   . Urine incontinence    H/O occasional   Past Surgical History:  Procedure Laterality Date  . BREAST BIOPSY Left 11/03/2014   neg./ done by Dr. Bary Castilla in office  . COLONOSCOPY  2008   Dr Sonny Masters  . 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 FINE NEEDLE ASPIRATION Left 2010   Dr Bary Castilla  . TONSILLECTOMY  1967   Family History  Problem Relation Age of Onset  . Breast cancer Mother 86  . Heart disease Father    Social History   Social History  . Marital status: Married    Spouse name: N/A  . Number of children: 3  . Years of education: N/A   Social History Main Topics  . Smoking status: Never Smoker  . Smokeless tobacco: Never Used  . Alcohol use 0.0 oz/week     Comment: drinks wine occasionally  . Drug use: No  . Sexual activity: Not Asked   Other Topics Concern  . None   Social History Narrative  . None    Outpatient Encounter Prescriptions as of 10/28/2015  Medication Sig  . acetaminophen (TYLENOL) 650 MG CR tablet Take 1,300 mg by mouth every 6 (six) hours as needed for pain.  . Calcium Carbonate-Vitamin D (CALTRATE 600+D PO) Take by  mouth daily.  . Diphenhyd-Hydrocort-Nystatin (FIRST-DUKES MOUTHWASH) SUSP 5cc's swish and spit tis  . meloxicam (MOBIC) 15 MG tablet Take 15 mg by mouth daily.  Marland Kitchen omeprazole (PRILOSEC) 20 MG capsule Take 20 mg by mouth daily.  . sertraline (ZOLOFT) 50 MG tablet TAKE ONE TABLET BY MOUTH EVERY DAY   No facility-administered encounter medications on file as of 10/28/2015.     Review of Systems  Constitutional: Negative for appetite change and unexpected weight change.  HENT: Negative for congestion and sinus pressure.   Respiratory: Negative for cough, chest tightness and shortness of breath.   Cardiovascular: Negative for chest pain, palpitations and leg swelling.  Gastrointestinal: Negative for abdominal pain, diarrhea, nausea and vomiting.  Genitourinary: Negative for difficulty urinating and dysuria.  Musculoskeletal: Negative for joint swelling.       Hip pain as outlined.    Skin: Negative for color change and rash.  Neurological: Negative for dizziness, light-headedness and headaches.  Psychiatric/Behavioral: Negative for agitation and dysphoric mood.       Objective:    Physical Exam  Constitutional: She appears well-developed and well-nourished. No distress.  HENT:  Nose: Nose normal.  Mouth/Throat: Oropharynx is clear and moist.  Neck: Neck supple. No thyromegaly present.  Cardiovascular: Normal rate and regular rhythm.   Pulmonary/Chest: Breath sounds normal. No respiratory distress. She has no wheezes.  Abdominal: Soft. Bowel sounds are normal. There is no tenderness.  Musculoskeletal: She exhibits no edema or tenderness.  Lymphadenopathy:    She has no cervical adenopathy.  Skin: No rash noted. No erythema.  Psychiatric: She has a normal mood and affect. Her behavior is normal.    BP 122/60   Pulse 82   Temp 98.6 F (37 C) (Oral)   Ht 5\' 4"  (1.626 m)   Wt 140 lb 12.8 oz (63.9 kg)   SpO2 97%   BMI 24.17 kg/m  Wt Readings from Last 3 Encounters:  10/28/15 140  lb 12.8 oz (63.9 kg)  06/24/15 135 lb 8 oz (61.5 kg)  05/05/15 138 lb (62.6 kg)     Lab Results  Component Value Date   WBC 6.0 02/24/2015   HGB 12.5 02/24/2015   HCT 38.7 02/24/2015   PLT 353.0 02/24/2015   GLUCOSE 103 (H) 10/28/2015   CHOL 199 10/28/2015   TRIG 72.0 10/28/2015   HDL 65.60 10/28/2015   LDLDIRECT 130.6 11/26/2012   LDLCALC 119 (H) 10/28/2015   ALT 16 10/28/2015   AST 18 10/28/2015   NA 140 10/28/2015   K 4.1 10/28/2015   CL 106 10/28/2015   CREATININE 0.62 10/28/2015   BUN 16 10/28/2015   CO2 30 10/28/2015   TSH 0.95 02/24/2015    US Breast Ltd Uni Left Inc Axilla  Result Date: 09/11/2015 CLINICAL DATA:  65 year old female presenting for follow-up of a probably benign left breast mass. On the prior diagnostic evaluation, a new suspicious mass was identified in the left breast at 1 o'clock. This was biopsied in office by Dr. Bary Castilla. 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 The patient presents today for follow-up of a second left breast mass at 3 o'clock. EXAM: 2D DIGITAL DIAGNOSTIC BILATERAL MAMMOGRAM WITH CAD AND ADJUNCT TOMO LEFT BREAST ULTRASOUND COMPARISON:  Previous exam(s). ACR Breast Density Category c: The breast tissue is heterogeneously dense, which may obscure small masses. FINDINGS: A biopsy marking clip is seen in the lightly upper-outer quadrant of the left breast at the site of the prior ultrasound-guided biopsy. The mass in the superior left breast previously seen mammographically is smaller on today's exam. The obscured mass in the lateral aspect of the left breast appears stable. No suspicious calcifications, masses or areas of distortion are seen in the bilateral breasts. Mammographic images were processed with CAD. Ultrasound targeted to the left breast at 1 o'clock, 4 cm from the nipple demonstrates a persistent is a hypoechoic oval mass measuring 5 x 4 x 3 mm, previously measuring 5 x 4 x 5 mm. The left breast  mass at 3 o'clock, 3 cm from the nipple measures 5 x 4 x 3 mm, previously measuring 7 x 6 x 4 mm. IMPRESSION: 1.  The probably benign masses in the left breast are stable. 2.  No mammographic evidence of malignancy in the bilateral breasts. RECOMMENDATION: Diagnostic mammogram is suggested in 1 year. (Code:DM-B-01Y) with ultrasound. I have discussed the findings and recommendations with the patient. Results were also provided in writing at the conclusion of the visit. If applicable, a reminder letter will be sent to the patient regarding the next appointment. BI-RADS CATEGORY  3: Probably benign. Electronically Signed   By:  Ammie Ferrier M.D.   On: 09/11/2015 12:45   Mm Diag Breast Tomo Bilateral  Result Date: 09/11/2015 CLINICAL DATA:  64 year old female presenting for follow-up of a probably benign left breast mass. On the prior diagnostic evaluation, a new suspicious mass was identified in the left breast at 1 o'clock. This was biopsied in office by Dr. Bary Castilla. 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 The patient presents today for follow-up of a second left breast mass at 3 o'clock. EXAM: 2D DIGITAL DIAGNOSTIC BILATERAL MAMMOGRAM WITH CAD AND ADJUNCT TOMO LEFT BREAST ULTRASOUND COMPARISON:  Previous exam(s). ACR Breast Density Category c: The breast tissue is heterogeneously dense, which may obscure small masses. FINDINGS: A biopsy marking clip is seen in the lightly upper-outer quadrant of the left breast at the site of the prior ultrasound-guided biopsy. The mass in the superior left breast previously seen mammographically is smaller on today's exam. The obscured mass in the lateral aspect of the left breast appears stable. No suspicious calcifications, masses or areas of distortion are seen in the bilateral breasts. Mammographic images were processed with CAD. Ultrasound targeted to the left breast at 1 o'clock, 4 cm from the nipple demonstrates a persistent is a  hypoechoic oval mass measuring 5 x 4 x 3 mm, previously measuring 5 x 4 x 5 mm. The left breast mass at 3 o'clock, 3 cm from the nipple measures 5 x 4 x 3 mm, previously measuring 7 x 6 x 4 mm. IMPRESSION: 1.  The probably benign masses in the left breast are stable. 2.  No mammographic evidence of malignancy in the bilateral breasts. RECOMMENDATION: Diagnostic mammogram is suggested in 1 year. (Code:DM-B-01Y) with ultrasound. I have discussed the findings and recommendations with the patient. Results were also provided in writing at the conclusion of the visit. If applicable, a reminder letter will be sent to the patient regarding the next appointment. BI-RADS CATEGORY  3: Probably benign. Electronically Signed   By: Ammie Ferrier M.D.   On: 09/11/2015 12:45       Assessment & Plan:   Problem List Items Addressed This Visit    Abnormal liver function tests    Last liver panel check wnl.        Abnormal mammogram    Mammogram 09/11/15 - Birads III.  Recommended diagnostic mammogram in one year.        Abnormal Pap smear of cervix    Previous abnormal pap.  PAP 12/2/214 - negative with negative HPV.        Hip pain    S/p right hip surgery.  Doing well.  Pain in left hip.  Injection did not help a lot.  Continue f/u with ortho.       Hyperbilirubinemia    Follow liver function tests.        Relevant Orders   Hepatic function panel (Completed)   Hypercholesterolemia    Low cholesterol diet and exercise.  Follow lipid panel and liver function tests.        Relevant Orders   Basic metabolic panel (Completed)   Lipid panel (Completed)   Osteoporosis - Primary    Due reclast.  Obtain labs.  Complete PA form.        Relevant Orders   Urinalysis, Routine w reflex microscopic (not at [facility]) (Completed)   Stress    On zoloft.  Handling stress.  Follow.        Other Visit Diagnoses    Encounter for immunization  Relevant Orders   Flu vaccine HIGH DOSE PF (Completed)         Einar Pheasant, MD

## 2015-10-28 NOTE — Progress Notes (Signed)
Pre visit review using our clinic review tool, if applicable. No additional management support is needed unless otherwise documented below in the visit note. 

## 2015-10-29 ENCOUNTER — Encounter: Payer: Self-pay | Admitting: *Deleted

## 2015-11-01 ENCOUNTER — Encounter: Payer: Self-pay | Admitting: Internal Medicine

## 2015-11-01 NOTE — Assessment & Plan Note (Signed)
Last liver panel check - wnl.  

## 2015-11-01 NOTE — Assessment & Plan Note (Signed)
S/p right hip surgery.  Doing well.  Pain in left hip.  Injection did not help a lot.  Continue f/u with ortho.

## 2015-11-01 NOTE — Assessment & Plan Note (Signed)
Follow liver function tests.   

## 2015-11-01 NOTE — Assessment & Plan Note (Signed)
Mammogram 09/11/15 - Birads III.  Recommended diagnostic mammogram in one year.

## 2015-11-01 NOTE — Assessment & Plan Note (Signed)
Due reclast.  Obtain labs.  Complete PA form.

## 2015-11-01 NOTE — Assessment & Plan Note (Signed)
On zoloft.  Handling stress.  Follow.   

## 2015-11-01 NOTE — Assessment & Plan Note (Signed)
Previous abnormal pap.  PAP 12/2/214 - negative with negative HPV.

## 2015-11-01 NOTE — Assessment & Plan Note (Signed)
Low cholesterol diet and exercise.  Follow lipid panel and liver function tests.  

## 2015-11-13 ENCOUNTER — Other Ambulatory Visit: Payer: Self-pay | Admitting: Internal Medicine

## 2015-11-18 ENCOUNTER — Telehealth: Payer: Self-pay | Admitting: Internal Medicine

## 2015-11-18 NOTE — Telephone Encounter (Signed)
Form completed and sent.  Someone should be contacting her.  Let me know if any problems.

## 2015-11-18 NOTE — Telephone Encounter (Signed)
Pt called stating that she was awaiting a call about a referral to Dr.[facility]le in regards to her 10/28/15 labs. There was not a referral placed. Was this pt meant to be referred. There was nothing about it in lab results. Please advise?

## 2015-11-18 NOTE — Telephone Encounter (Signed)
Pt was made aware of information given Doctors office number.

## 2016-02-19 ENCOUNTER — Other Ambulatory Visit: Payer: Self-pay | Admitting: Internal Medicine

## 2016-03-31 ENCOUNTER — Telehealth: Payer: Self-pay | Admitting: Internal Medicine

## 2016-03-31 NOTE — Telephone Encounter (Signed)
Pt lvm asking if Dr. Nicki Reaper would recommend her to get a pneumonia shot? If so should she make an appointment here or go to her pharmacy? Pt cb [phone]

## 2016-03-31 NOTE — Telephone Encounter (Signed)
Called patient informed she had not had she can get at next follow up or go to pharmacy if she would like. She has app next month. She will go to pharmacy.

## 2016-04-28 ENCOUNTER — Ambulatory Visit (INDEPENDENT_AMBULATORY_CARE_PROVIDER_SITE_OTHER): Payer: Medicare Other | Admitting: Internal Medicine

## 2016-04-28 ENCOUNTER — Encounter: Payer: Self-pay | Admitting: Internal Medicine

## 2016-04-28 VITALS — BP 134/62 | HR 77 | Temp 98.6°F | Resp 16 | Ht 63.5 in | Wt 144.6 lb

## 2016-04-28 DIAGNOSIS — R87619 Unspecified abnormal cytological findings in specimens from cervix uteri: Secondary | ICD-10-CM

## 2016-04-28 DIAGNOSIS — M25559 Pain in unspecified hip: Secondary | ICD-10-CM | POA: Diagnosis not present

## 2016-04-28 DIAGNOSIS — R7989 Other specified abnormal findings of blood chemistry: Secondary | ICD-10-CM

## 2016-04-28 DIAGNOSIS — F439 Reaction to severe stress, unspecified: Secondary | ICD-10-CM | POA: Diagnosis not present

## 2016-04-28 DIAGNOSIS — Z1239 Encounter for other screening for malignant neoplasm of breast: Secondary | ICD-10-CM

## 2016-04-28 DIAGNOSIS — M810 Age-related osteoporosis without current pathological fracture: Secondary | ICD-10-CM | POA: Diagnosis not present

## 2016-04-28 DIAGNOSIS — Z124 Encounter for screening for malignant neoplasm of cervix: Secondary | ICD-10-CM | POA: Diagnosis not present

## 2016-04-28 DIAGNOSIS — Z0000 Encounter for general adult medical examination without abnormal findings: Secondary | ICD-10-CM

## 2016-04-28 DIAGNOSIS — Z1231 Encounter for screening mammogram for malignant neoplasm of breast: Secondary | ICD-10-CM

## 2016-04-28 DIAGNOSIS — E7800 Pure hypercholesterolemia, unspecified: Secondary | ICD-10-CM

## 2016-04-28 DIAGNOSIS — R928 Other abnormal and inconclusive findings on diagnostic imaging of breast: Secondary | ICD-10-CM

## 2016-04-28 DIAGNOSIS — R945 Abnormal results of liver function studies: Secondary | ICD-10-CM

## 2016-04-28 LAB — HEPATIC FUNCTION PANEL
ALBUMIN: 4.8 g/dL (ref 3.5–5.2)
ALK PHOS: 86 U/L (ref 39–117)
ALT: 16 U/L (ref 0–35)
AST: 18 U/L (ref 0–37)
BILIRUBIN TOTAL: 1.4 mg/dL — AB (ref 0.2–1.2)
Bilirubin, Direct: 0.2 mg/dL (ref 0.0–0.3)
Total Protein: 7.4 g/dL (ref 6.0–8.3)

## 2016-04-28 LAB — BASIC METABOLIC PANEL
BUN: 16 mg/dL (ref 6–23)
CALCIUM: 9.6 mg/dL (ref 8.4–10.5)
CO2: 30 meq/L (ref 19–32)
CREATININE: 0.67 mg/dL (ref 0.40–1.20)
Chloride: 105 mEq/L (ref 96–112)
GFR: 93.46 mL/min (ref >60.00)
GLUCOSE: 111 mg/dL — AB (ref 70–99)
Potassium: 4.4 mEq/L (ref 3.5–5.1)
Sodium: 140 mEq/L (ref 135–145)

## 2016-04-28 LAB — CBC WITH DIFFERENTIAL/PLATELET
BASOS ABS: 0 10*3/uL (ref 0.0–0.1)
Basophils Relative: 0.6 % (ref 0.0–3.0)
EOS PCT: 0.7 % (ref 0.0–5.0)
Eosinophils Absolute: 0 10*3/uL (ref 0.0–0.7)
HCT: 38.7 % (ref 36.0–46.0)
HEMOGLOBIN: 12.8 g/dL (ref 12.0–15.0)
LYMPHS ABS: 2.6 10*3/uL (ref 0.7–4.0)
Lymphocytes Relative: 40.7 % (ref 12.0–46.0)
MCHC: 33.2 g/dL (ref 30.0–36.0)
MCV: 86.7 fl (ref 78.0–100.0)
MONOS PCT: 7.7 % (ref 3.0–12.0)
Monocytes Absolute: 0.5 10*3/uL (ref 0.1–1.0)
NEUTROS PCT: 50.3 % (ref 43.0–77.0)
Neutro Abs: 3.2 10*3/uL (ref 1.4–7.7)
Platelets: 311 10*3/uL (ref 150.0–400.0)
RBC: 4.46 Mil/uL (ref 3.87–5.11)
RDW: 13.7 % (ref 11.5–15.5)
WBC: 6.3 10*3/uL (ref 4.0–10.5)

## 2016-04-28 LAB — LIPID PANEL
CHOL/HDL RATIO: 4
Cholesterol: 223 mg/dL — ABNORMAL HIGH (ref 0–200)
HDL: 58.9 mg/dL (ref >39.00)
LDL Cholesterol: 143 mg/dL — ABNORMAL HIGH (ref 0–99)
NONHDL: 163.61
Triglycerides: 105 mg/dL (ref 0.0–149.0)
VLDL: 21 mg/dL (ref 0.0–40.0)

## 2016-04-28 LAB — TSH: TSH: 0.98 u[IU]/mL (ref 0.35–4.50)

## 2016-04-28 MED ORDER — SERTRALINE HCL 50 MG PO TABS: 50.0000 mg | ORAL_TABLET | Freq: Every day | ORAL | 4 refills | Status: DC

## 2016-04-28 NOTE — Progress Notes (Signed)
Pre-visit discussion using our clinic review tool. No additional management support is needed unless otherwise documented below in the visit note.  

## 2016-04-28 NOTE — Progress Notes (Signed)
Patient ID: Theresa Francis, female   DOB: 05-05-1950, 66 y.o.   MRN: [id number]   Subjective:    Patient ID: Theresa Francis, female    DOB: [birth year], 66 y.o.   MRN: [id number]  HPI  Patient here for her physical exam.  She is having issues with her left hip now.  Increased pain.  Limits her activity.  Planning for left hip surgery.  Did well with her right hip surgery.  Tries to stay active.  No chest pain.  No sob.  No acid reflux.  No abdominal pain.  Bowels moving.  Saw dermatology.  Evaluation ok.  Still doing yoga.     Past Medical History:  Diagnosis Date  . Abnormal Pap smear    s/p conization.   . Basal cell carcinoma    eye lid  . History of chicken pox   . History of shingles   . Osteoarthritis   . Urine incontinence    H/O occasional   Past Surgical History:  Procedure Laterality Date  . BREAST BIOPSY Left 11/03/2014   neg./ done by Dr. Bary Castilla in office  . COLONOSCOPY  2008   Dr Sonny Masters  . 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 FINE NEEDLE ASPIRATION Left 2010   Dr Bary Castilla  . TONSILLECTOMY  1967   Family History  Problem Relation Age of Onset  . Breast cancer Mother 57  . Heart disease Father    Social History   Social History  . Marital status: Married    Spouse name: N/A  . Number of children: 3  . Years of education: N/A   Social History Main Topics  . Smoking status: Never Smoker  . Smokeless tobacco: Never Used  . Alcohol use 0.0 oz/week     Comment: drinks wine occasionally  . Drug use: No  . Sexual activity: Not Asked   Other Topics Concern  . None   Social History Narrative  . None    Outpatient Encounter Prescriptions as of 04/28/2016  Medication Sig  . acetaminophen (TYLENOL) 650 MG CR tablet Take 1,300 mg by mouth every 6 (six) hours as needed for pain.  . Calcium Carbonate-Vitamin D (CALTRATE 600+D PO) Take by mouth daily.  . Cholecalciferol (D3 SUPER STRENGTH) 2000 units CAPS Take 1 capsule by mouth daily.  .  Diphenhyd-Hydrocort-Nystatin (FIRST-DUKES MOUTHWASH) SUSP 5cc's swish and spit tis  . meloxicam (MOBIC) 15 MG tablet Take 15 mg by mouth daily.  Marland Kitchen omeprazole (PRILOSEC) 20 MG capsule Take 20 mg by mouth daily.  . sertraline (ZOLOFT) 50 MG tablet Take 1 tablet (50 mg total) by mouth daily.  . [DISCONTINUED] sertraline (ZOLOFT) 50 MG tablet TAKE ONE TABLET BY MOUTH EVERY DAY   No facility-administered encounter medications on file as of 04/28/2016.     Review of Systems  Constitutional: Negative for appetite change and unexpected weight change.  HENT: Negative for congestion and sinus pressure.   Eyes: Negative for pain and visual disturbance.  Respiratory: Negative for cough, chest tightness and shortness of breath.   Cardiovascular: Negative for chest pain, palpitations and leg swelling.  Gastrointestinal: Negative for abdominal pain, diarrhea, nausea and vomiting.  Genitourinary: Negative for difficulty urinating and dysuria.  Musculoskeletal: Negative for back pain and joint swelling.       Left hip pain as outlined.   Skin: Negative for color change and rash.  Neurological: Negative for dizziness, light-headedness and headaches.  Hematological: Negative for adenopathy. Does not  bruise/bleed easily.  Psychiatric/Behavioral: Negative for agitation and dysphoric mood.       Objective:    Physical Exam  Constitutional: She is oriented to person, place, and time. She appears well-developed and well-nourished. No distress.  HENT:  Nose: Nose normal.  Mouth/Throat: Oropharynx is clear and moist.  Eyes: Right eye exhibits no discharge. Left eye exhibits no discharge. No scleral icterus.  Neck: Neck supple. No thyromegaly present.  Cardiovascular: Normal rate and regular rhythm.   Pulmonary/Chest: Breath sounds normal. No accessory muscle usage. No tachypnea. No respiratory distress. She has no decreased breath sounds. She has no wheezes. She has no rhonchi. Right breast exhibits no  inverted nipple, no mass, no nipple discharge and no tenderness (no axillary adenopathy). Left breast exhibits no inverted nipple, no mass, no nipple discharge and no tenderness (no axilarry adenopathy).  Abdominal: Soft. Bowel sounds are normal. There is no tenderness.  Genitourinary:  Genitourinary Comments: Normal external genitalia.  Vaginal vault without lesions.  Cervix identified.  Pap smear performed.  Could not appreciate any adnexal masses or tenderness.    Musculoskeletal: She exhibits no edema or tenderness.  Lymphadenopathy:    She has no cervical adenopathy.  Neurological: She is alert and oriented to person, place, and time.  Skin: Skin is warm. No rash noted. No erythema.  Psychiatric: She has a normal mood and affect. Her behavior is normal.    BP 134/62 (BP Location: Left Arm, Patient Position: Sitting, Cuff Size: Normal)   Pulse 77   Temp 98.6 F (37 C) (Oral)   Resp 16   Ht 5' 3.5" (1.613 m)   Wt 144 lb 9.6 oz (65.6 kg)   SpO2 98%   BMI 25.21 kg/m  Wt Readings from Last 3 Encounters:  04/28/16 144 lb 9.6 oz (65.6 kg)  10/28/15 140 lb 12.8 oz (63.9 kg)  06/24/15 135 lb 8 oz (61.5 kg)     Lab Results  Component Value Date   WBC 6.3 04/28/2016   HGB 12.8 04/28/2016   HCT 38.7 04/28/2016   PLT 311.0 04/28/2016   GLUCOSE 111 (H) 04/28/2016   CHOL 223 (H) 04/28/2016   TRIG 105.0 04/28/2016   HDL 58.90 04/28/2016   LDLDIRECT 130.6 11/26/2012   LDLCALC 143 (H) 04/28/2016   ALT 16 04/28/2016   AST 18 04/28/2016   NA 140 04/28/2016   K 4.4 04/28/2016   CL 105 04/28/2016   CREATININE 0.67 04/28/2016   BUN 16 04/28/2016   CO2 30 04/28/2016   TSH 0.98 04/28/2016    US Breast [place] Axilla  Result Date: 09/11/2015 CLINICAL DATA:  66 year old female presenting for follow-up of a probably benign left breast mass. On the prior diagnostic evaluation, a new suspicious mass was identified in the left breast at 1 o'clock. This was biopsied in office by  Dr. Bary Castilla. 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 The patient presents today for follow-up of a second left breast mass at 3 o'clock. EXAM: 2D DIGITAL DIAGNOSTIC BILATERAL MAMMOGRAM WITH CAD AND ADJUNCT TOMO LEFT BREAST ULTRASOUND COMPARISON:  Previous exam(s). ACR Breast Density Category c: The breast tissue is heterogeneously dense, which may obscure small masses. FINDINGS: A biopsy marking clip is seen in the lightly upper-outer quadrant of the left breast at the site of the prior ultrasound-guided biopsy. The mass in the superior left breast previously seen mammographically is smaller on today's exam. The obscured mass in the lateral aspect of the left breast appears  stable. No suspicious calcifications, masses or areas of distortion are seen in the bilateral breasts. Mammographic images were processed with CAD. Ultrasound targeted to the left breast at 1 o'clock, 4 cm from the nipple demonstrates a persistent is a hypoechoic oval mass measuring 5 x 4 x 3 mm, previously measuring 5 x 4 x 5 mm. The left breast mass at 3 o'clock, 3 cm from the nipple measures 5 x 4 x 3 mm, previously measuring 7 x 6 x 4 mm. IMPRESSION: 1.  The probably benign masses in the left breast are stable. 2.  No mammographic evidence of malignancy in the bilateral breasts. RECOMMENDATION: Diagnostic mammogram is suggested in 1 year. (Code:DM-B-01Y) with ultrasound. I have discussed the findings and recommendations with the patient. Results were also provided in writing at the conclusion of the visit. If applicable, a reminder letter will be sent to the patient regarding the next appointment. BI-RADS CATEGORY  3: Probably benign. Electronically Signed   By: Ammie Ferrier M.D.   On: 09/11/2015 12:45   Mm Diag Breast Tomo Bilateral  Result Date: 09/11/2015 CLINICAL DATA:  66 year old female presenting for follow-up of a probably benign left breast mass. On the prior diagnostic evaluation, a new  suspicious mass was identified in the left breast at 1 o'clock. This was biopsied in office by Dr. Bary Castilla. 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 The patient presents today for follow-up of a second left breast mass at 3 o'clock. EXAM: 2D DIGITAL DIAGNOSTIC BILATERAL MAMMOGRAM WITH CAD AND ADJUNCT TOMO LEFT BREAST ULTRASOUND COMPARISON:  Previous exam(s). ACR Breast Density Category c: The breast tissue is heterogeneously dense, which may obscure small masses. FINDINGS: A biopsy marking clip is seen in the lightly upper-outer quadrant of the left breast at the site of the prior ultrasound-guided biopsy. The mass in the superior left breast previously seen mammographically is smaller on today's exam. The obscured mass in the lateral aspect of the left breast appears stable. No suspicious calcifications, masses or areas of distortion are seen in the bilateral breasts. Mammographic images were processed with CAD. Ultrasound targeted to the left breast at 1 o'clock, 4 cm from the nipple demonstrates a persistent is a hypoechoic oval mass measuring 5 x 4 x 3 mm, previously measuring 5 x 4 x 5 mm. The left breast mass at 3 o'clock, 3 cm from the nipple measures 5 x 4 x 3 mm, previously measuring 7 x 6 x 4 mm. IMPRESSION: 1.  The probably benign masses in the left breast are stable. 2.  No mammographic evidence of malignancy in the bilateral breasts. RECOMMENDATION: Diagnostic mammogram is suggested in 1 year. (Code:DM-B-01Y) with ultrasound. I have discussed the findings and recommendations with the patient. Results were also provided in writing at the conclusion of the visit. If applicable, a reminder letter will be sent to the patient regarding the next appointment. BI-RADS CATEGORY  3: Probably benign. Electronically Signed   By: Ammie Ferrier M.D.   On: 09/11/2015 12:45       Assessment & Plan:   Problem List Items Addressed This Visit    Abnormal liver function tests     Recheck liver panel.        Abnormal mammogram   Relevant Orders   US BREAST LTD UNI LEFT INC AXILLA   US BREAST LTD UNI RIGHT INC AXILLA   MM DIAG BREAST TOMO BILATERAL   Abnormal Pap smear of cervix    Pap repeated today.  Health care maintenance    Physical today 04/28/16.  PAP 01/22/13 - negative with negative HPV. Repeat pap today 04/28/16.   Colonoscopy 2007.  Recommended f/u colonoscopy in 10 years.  Discussed with her today.  Had to postpone previously secondary to her right hip surgery.  She agreed to cologuard.  Mammogram scheduled.        Hip pain    Now with left hip pain.  Limiting her activity.  Planning for hip xray.        Hypercholesterolemia    Low cholesterol diet and exercise.  Follow lipid panel.        Relevant Orders   TSH (Completed)   Lipid panel (Completed)   CBC with Differential/Platelet (Completed)   Hepatic function panel (Completed)   Basic metabolic panel (Completed)   Osteoporosis    Received reclast.  Continue vitamin D.  Follow.       Relevant Medications   Cholecalciferol (D3 SUPER STRENGTH) 2000 units CAPS   Stress    On zoloft.  Doing well.  Follow.         Other Visit Diagnoses    Screening for breast cancer    -  Primary   Screening for cervical cancer       Relevant Orders   Cytology - PAP       Einar Pheasant, MD

## 2016-04-28 NOTE — Assessment & Plan Note (Addendum)
Physical today 04/28/16.  PAP 01/22/13 - negative with negative HPV. Repeat pap today 04/28/16.   Colonoscopy 2007.  Recommended f/u colonoscopy in 10 years.  Discussed with her today.  Had to postpone previously secondary to her right hip surgery.  She agreed to cologuard.  Mammogram scheduled.

## 2016-04-29 ENCOUNTER — Other Ambulatory Visit (HOSPITAL_COMMUNITY)
Admission: RE | Admit: 2016-04-29 | Discharge: 2016-04-29 | Disposition: A | Discharge status: Home / Self Care | Payer: Medicare Other | Source: Ambulatory Visit | Attending: Internal Medicine | Admitting: Internal Medicine

## 2016-04-29 DIAGNOSIS — Z1151 Encounter for screening for human papillomavirus (HPV): Secondary | ICD-10-CM | POA: Diagnosis present

## 2016-04-29 DIAGNOSIS — Z01411 Encounter for gynecological examination (general) (routine) with abnormal findings: Secondary | ICD-10-CM | POA: Diagnosis present

## 2016-05-01 ENCOUNTER — Encounter: Payer: Self-pay | Admitting: Internal Medicine

## 2016-05-01 ENCOUNTER — Other Ambulatory Visit: Payer: Self-pay | Admitting: Internal Medicine

## 2016-05-01 DIAGNOSIS — R739 Hyperglycemia, unspecified: Secondary | ICD-10-CM

## 2016-05-01 NOTE — Assessment & Plan Note (Signed)
Received reclast.  Continue vitamin D.  Follow.

## 2016-05-01 NOTE — Assessment & Plan Note (Signed)
On zoloft.  Doing well.  Follow.  

## 2016-05-01 NOTE — Assessment & Plan Note (Signed)
Now with left hip pain.  Limiting her activity.  Planning for hip xray.

## 2016-05-01 NOTE — Assessment & Plan Note (Signed)
Low cholesterol diet and exercise.  Follow lipid panel.   

## 2016-05-01 NOTE — Assessment & Plan Note (Signed)
Recheck liver panel.  

## 2016-05-01 NOTE — Progress Notes (Signed)
Order placed for f/u labs.  

## 2016-05-01 NOTE — Assessment & Plan Note (Signed)
Pap repeated today.

## 2016-05-02 ENCOUNTER — Telehealth: Payer: Self-pay

## 2016-05-02 NOTE — Telephone Encounter (Signed)
Patient called back all information given and app made.

## 2016-05-02 NOTE — Telephone Encounter (Signed)
-----   Message from Einar Pheasant, MD sent at 05/01/2016  6:27 PM EDT ----- Notify pt that her cholesterol has increased when compared to previous check.  (she has not been able to exercise as regularly with her hip).  Continue diet.  We will follow.  Bilirubin stable.  Sugar is increased some.  Schedule fasting glucose and a1c in 3-4 weeks.  Hgb, thyroid test, kidney function tests and liver function tests are wnl.

## 2016-05-02 NOTE — Telephone Encounter (Signed)
Left message to return call to our office.  

## 2016-05-04 LAB — CYTOLOGY - PAP: HPV (WINDOPATH): NOT DETECTED

## 2016-05-12 LAB — COLOGUARD: COLOGUARD: NEGATIVE

## 2016-05-24 ENCOUNTER — Telehealth: Payer: Self-pay

## 2016-05-24 NOTE — Telephone Encounter (Signed)
Left message to return call to our office.  Called patient to let know Cologuard was negative. Report sent to scan

## 2016-05-25 ENCOUNTER — Other Ambulatory Visit: Payer: Medicare Other

## 2016-05-27 ENCOUNTER — Other Ambulatory Visit (INDEPENDENT_AMBULATORY_CARE_PROVIDER_SITE_OTHER): Payer: Medicare Other

## 2016-05-27 DIAGNOSIS — R739 Hyperglycemia, unspecified: Secondary | ICD-10-CM

## 2016-05-27 LAB — HEMOGLOBIN A1C: HEMOGLOBIN A1C: 5.5 % (ref 4.6–6.5)

## 2016-05-28 LAB — GLUCOSE, FASTING: Glucose, Fasting: 113 mg/dL — ABNORMAL HIGH (ref 65–99)

## 2016-05-30 ENCOUNTER — Telehealth: Payer: Self-pay

## 2016-05-30 NOTE — Telephone Encounter (Signed)
-----   Message from Einar Pheasant, MD sent at 05/28/2016 11:07 AM EDT ----- Notify pt that her overall sugar control ok.  We will follow.

## 2016-05-30 NOTE — Telephone Encounter (Signed)
Left message to return call to our office.  

## 2016-06-01 NOTE — Telephone Encounter (Signed)
Patient informed no questions  

## 2016-06-08 ENCOUNTER — Ambulatory Visit (INDEPENDENT_AMBULATORY_CARE_PROVIDER_SITE_OTHER): Payer: Medicare Other | Admitting: Family

## 2016-06-08 ENCOUNTER — Telehealth: Payer: Self-pay

## 2016-06-08 ENCOUNTER — Encounter: Payer: Self-pay | Admitting: Family

## 2016-06-08 VITALS — BP 132/70 | HR 100 | Temp 99.7°F | Ht 63.5 in | Wt 141.0 lb

## 2016-06-08 DIAGNOSIS — J40 Bronchitis, not specified as acute or chronic: Secondary | ICD-10-CM

## 2016-06-08 MED ORDER — BENZONATATE 100 MG PO CAPS: 100.0000 mg | ORAL_CAPSULE | Freq: Two times a day (BID) | ORAL | 0 refills | Status: DC | PRN

## 2016-06-08 NOTE — Telephone Encounter (Signed)
Patient calls stating she was seen today and was supposed to get script for tessalon perles.  However it didn't get sent to there pharmacy.  Please advise if ok to send to pharmacy.  Thanks.

## 2016-06-08 NOTE — Patient Instructions (Signed)
As discussed, suspect viral etiology  Plain mucinex with lots of water  Tessalon perles  Let me know if not better

## 2016-06-08 NOTE — Progress Notes (Signed)
Pre visit review using our clinic review tool, if applicable. No additional management support is needed unless otherwise documented below in the visit note. 

## 2016-06-08 NOTE — Progress Notes (Signed)
Subjective:    Patient ID: Theresa Francis, female    DOB: 1950/07/04, 66 y.o.   MRN: [id number]  CC: Theresa Francis is a 66 y.o. female who presents today for an acute visit.    HPI: CC: productive cough x 3 days, worsening congestion. No sinus pain, ear pain, wheezing. Feels a bit 'short of breath occasionally from phelgm'.  hasnt tried any medication for congestion.       No  Ling disease. Non smoker  HISTORY:  Past Medical History:  Diagnosis Date  . Abnormal Pap smear    s/p conization.   . Basal cell carcinoma    eye lid  . History of chicken pox   . History of shingles   . Osteoarthritis   . Urine incontinence    H/O occasional   Past Surgical History:  Procedure Laterality Date  . BREAST BIOPSY Left 11/03/2014   neg./ done by Dr. Bary Francis in office  . COLONOSCOPY  2008   Dr Theresa Francis  . 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 FINE NEEDLE ASPIRATION Left 2010   Dr Theresa Francis  . TONSILLECTOMY  1967   Family History  Problem Relation Age of Onset  . Breast cancer Mother 75  . Heart disease Father     Allergies: Patient has no known allergies. Current Outpatient Prescriptions on File Prior to Visit  Medication Sig Dispense Refill  . acetaminophen (TYLENOL) 650 MG CR tablet Take 1,300 mg by mouth every 6 (six) hours as needed for pain.    . Calcium Carbonate-Vitamin D (CALTRATE 600+D PO) Take by mouth daily.    . Cholecalciferol (D3 SUPER STRENGTH) 2000 units CAPS Take 1 capsule by mouth daily.    . Diphenhyd-Hydrocort-Nystatin (FIRST-DUKES MOUTHWASH) SUSP 5cc's swish and spit tis 1 Bottle 0  . meloxicam (MOBIC) 15 MG tablet Take 15 mg by mouth daily.    Marland Kitchen omeprazole (PRILOSEC) 20 MG capsule Take 20 mg by mouth daily.    . sertraline (ZOLOFT) 50 MG tablet Take 1 tablet (50 mg total) by mouth daily. 30 tablet 4   No current facility-administered medications on file prior to visit.     Social History  Substance Use Topics  . Smoking status:  Never Smoker  . Smokeless tobacco: Never Used  . Alcohol use 0.0 oz/week     Comment: drinks wine occasionally    Review of Systems  Constitutional: Negative for chills and fever.  HENT: Positive for congestion. Negative for sinus pain and sore throat.   Eyes: Negative for visual disturbance.  Respiratory: Positive for cough and shortness of breath. Negative for wheezing.   Cardiovascular: Negative for chest pain and palpitations.  Gastrointestinal: Negative for nausea and vomiting.  Neurological: Negative for headaches.      Objective:    BP 132/70   Pulse 100   Temp 99.7 F (37.6 C) (Oral)   Ht 5' 3.5" (1.613 m)   Wt 141 lb (64 kg)   SpO2 94%   BMI 24.59 kg/m    Physical Exam  Constitutional: She appears well-developed and well-nourished.  HENT:  Head: Normocephalic and atraumatic.  Right Ear: Hearing, tympanic membrane, external ear and ear canal normal. No drainage, swelling or tenderness. No foreign bodies. Tympanic membrane is not erythematous and not bulging. No middle ear effusion. No decreased hearing is noted.  Left Ear: Hearing, tympanic membrane, external ear and ear canal normal. No drainage, swelling or tenderness. No foreign bodies. Tympanic membrane  is not erythematous and not bulging.  No middle ear effusion. No decreased hearing is noted.  Nose: Rhinorrhea present. Right sinus exhibits no maxillary sinus tenderness and no frontal sinus tenderness. Left sinus exhibits no maxillary sinus tenderness and no frontal sinus tenderness.  Mouth/Throat: Uvula is midline, oropharynx is clear and moist and mucous membranes are normal. No oropharyngeal exudate, posterior oropharyngeal edema, posterior oropharyngeal erythema or tonsillar abscesses.  Eyes: Conjunctivae are normal.  Cardiovascular: Regular rhythm, normal heart sounds and normal pulses.   Pulmonary/Chest: Effort normal and breath sounds normal. She has no wheezes. She has no rhonchi. She has no rales.    Lymphadenopathy:       Head (right side): No submental, no submandibular, no tonsillar, no preauricular, no posterior auricular and no occipital adenopathy present.       Head (left side): No submental, no submandibular, no tonsillar, no preauricular, no posterior auricular and no occipital adenopathy present.    She has no cervical adenopathy.  Neurological: She is alert.  Skin: Skin is warm and dry.  Psychiatric: She has a normal mood and affect. Her speech is normal and behavior is normal. Thought content normal.  Vitals reviewed.      Assessment & Plan:  1. Bronchitis Working diagnosis of viral URI x 3 days. No acute respiratory distress. Afebrile. Patient and I agreed upon conservative therapy at this time with symptom management, close observation, and delayed antibiotic treatment. Tessalon PRN.     I am having [name] maintain her Calcium Carbonate-Vitamin D (CALTRATE 600+D PO), meloxicam, omeprazole, acetaminophen, FIRST-DUKES MOUTHWASH, Cholecalciferol, and sertraline.   No orders of the defined types were placed in this encounter.   Return precautions given.   Risks, benefits, and alternatives of the medications and treatment plan prescribed today were discussed, and patient expressed understanding.   Education regarding symptom management and diagnosis given to patient on AVS.  Continue to follow with Theresa Pheasant, MD for routine health maintenance.   Theresa Francis and I agreed with plan.   Theresa Paris, FNP

## 2016-06-09 ENCOUNTER — Encounter: Payer: Self-pay | Admitting: Family

## 2016-06-09 ENCOUNTER — Telehealth: Payer: Self-pay | Admitting: Internal Medicine

## 2016-06-09 NOTE — Telephone Encounter (Signed)
Per verbal orders from Joycelyn Schmid called in script for Benzonatate called into [facility].   Patient advised on 06/09/16 script called into pharmacy.

## 2016-06-09 NOTE — Telephone Encounter (Signed)
Theresa Francis [phone] called from [facility] pt will be having her surgery done at [place] is calling from the preop dept wanting to get a copy of pt prior labs (Creatinine). Fax to [phone]. Thank you!

## 2016-06-09 NOTE — Telephone Encounter (Signed)
Faxed will need to call in am to make sure received.

## 2016-06-13 NOTE — Telephone Encounter (Signed)
Called l/m on voicemail for Theresa Francis to call if not received.

## 2016-06-14 ENCOUNTER — Telehealth: Payer: Self-pay | Admitting: *Deleted

## 2016-06-14 DIAGNOSIS — Z01818 Encounter for other preprocedural examination: Secondary | ICD-10-CM

## 2016-06-14 NOTE — Telephone Encounter (Signed)
Patient was advised by [facility]  to have a creatine level checked  . Patient has surgery scheduled for 07/04/16 Please advise  Pt contact [place] at [facility] [phone]

## 2016-06-14 NOTE — Telephone Encounter (Signed)
I have placed the order for creatinine only since this is only thing mentioned.  Thanks

## 2016-06-14 NOTE — Telephone Encounter (Signed)
Spoke to patient I have made lab app for 06-21-16 just need orders put in.

## 2016-06-20 ENCOUNTER — Telehealth: Payer: Self-pay | Admitting: Radiology

## 2016-06-20 NOTE — Telephone Encounter (Signed)
Should only be the Creatinine

## 2016-06-20 NOTE — Telephone Encounter (Signed)
Pt coming in for labs tomorrow, did you just want Creatinine?

## 2016-06-20 NOTE — Telephone Encounter (Signed)
See this message and message from 06/14/16.  Is creatinine the only lab needed?  This was the only lab mentioned.  If so, then let [place] know.  If not, let me know and I will order whatever labs are needed.

## 2016-06-21 ENCOUNTER — Other Ambulatory Visit (INDEPENDENT_AMBULATORY_CARE_PROVIDER_SITE_OTHER): Payer: Medicare Other

## 2016-06-21 DIAGNOSIS — Z01818 Encounter for other preprocedural examination: Secondary | ICD-10-CM

## 2016-06-21 LAB — CREATININE, SERUM: CREATININE: 0.66 mg/dL (ref 0.40–1.20)

## 2016-08-05 ENCOUNTER — Other Ambulatory Visit: Payer: Self-pay | Admitting: Internal Medicine

## 2016-09-13 ENCOUNTER — Ambulatory Visit
Admission: RE | Admit: 2016-09-13 | Discharge: 2016-09-13 | Disposition: A | Discharge status: Home / Self Care | Payer: Medicare Other | Source: Ambulatory Visit | Attending: Internal Medicine | Admitting: Internal Medicine

## 2016-09-13 DIAGNOSIS — R928 Other abnormal and inconclusive findings on diagnostic imaging of breast: Secondary | ICD-10-CM | POA: Insufficient documentation

## 2016-09-15 ENCOUNTER — Encounter: Payer: Self-pay | Admitting: Internal Medicine

## 2016-09-15 ENCOUNTER — Ambulatory Visit (INDEPENDENT_AMBULATORY_CARE_PROVIDER_SITE_OTHER): Payer: Medicare Other | Admitting: Internal Medicine

## 2016-09-15 VITALS — BP 134/68 | HR 90 | Temp 99.0°F | Resp 12 | Ht 64.0 in | Wt 138.0 lb

## 2016-09-15 DIAGNOSIS — M25559 Pain in unspecified hip: Secondary | ICD-10-CM

## 2016-09-15 DIAGNOSIS — R928 Other abnormal and inconclusive findings on diagnostic imaging of breast: Secondary | ICD-10-CM | POA: Diagnosis not present

## 2016-09-15 DIAGNOSIS — R739 Hyperglycemia, unspecified: Secondary | ICD-10-CM | POA: Diagnosis not present

## 2016-09-15 DIAGNOSIS — M810 Age-related osteoporosis without current pathological fracture: Secondary | ICD-10-CM | POA: Diagnosis not present

## 2016-09-15 DIAGNOSIS — E7800 Pure hypercholesterolemia, unspecified: Secondary | ICD-10-CM

## 2016-09-15 DIAGNOSIS — F439 Reaction to severe stress, unspecified: Secondary | ICD-10-CM | POA: Diagnosis not present

## 2016-09-15 NOTE — Progress Notes (Signed)
Pre-visit discussion using our clinic review tool. No additional management support is needed unless otherwise documented below in the visit note.  

## 2016-09-15 NOTE — Progress Notes (Signed)
Patient ID: MAHIYA KERCHEVAL, female   DOB: 1951/01/30, 66 y.o.   MRN: [id number]   Subjective:    Patient ID: [name], female    DOB: 1950/12/10, 66 y.o.   MRN: [id number]  HPI  Patient here for a scheduled follow up.  She is s/p recent hip surgery.  [place] in mid June.  Right buttock pain.  Has resolved now.  She is sleeping better.  Feels better.  Started back to yoga.  Has adjusted her diet.  Has lost weight.  No chest pain.  No sob.  No acid reflux.  No abdominal pain.  Bowels moving.  Had f/u mammogram 09/13/16.  Recommended f/u mammogram in 6 months.  She is comfortable with this plan.     Past Medical History:  Diagnosis Date  . Abnormal Pap smear    s/p conization.   . Basal cell carcinoma    eye lid  . History of chicken pox   . History of shingles   . Osteoarthritis   . Urine incontinence    H/O occasional   Past Surgical History:  Procedure Laterality Date  . BREAST BIOPSY Left 11/03/2014   neg./ done by Dr. Bary Castilla in office  . BREAST CYST ASPIRATION Left   . COLONOSCOPY  2008   Dr Sonny Masters  . 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 FINE NEEDLE ASPIRATION Left 2010   Dr Bary Castilla  . TONSILLECTOMY  1967   Family History  Problem Relation Age of Onset  . Breast cancer Mother 52  . Heart disease Father    Social History   Social History  . Marital status: Married    Spouse name: N/A  . Number of children: 3  . Years of education: N/A   Social History Main Topics  . Smoking status: Never Smoker  . Smokeless tobacco: Never Used  . Alcohol use 0.0 oz/week     Comment: drinks wine occasionally  . Drug use: No  . Sexual activity: Not Asked   Other Topics Concern  . None   Social History Narrative  . None    Outpatient Encounter Prescriptions as of 09/15/2016  Medication Sig  . acetaminophen (TYLENOL) 650 MG CR tablet Take 1,300 mg by mouth every 6 (six) hours as needed for pain.  . Calcium Carbonate-Vitamin D (CALTRATE 600+D PO)  Take by mouth daily.  . Cholecalciferol (D3 SUPER STRENGTH) 2000 units CAPS Take 1 capsule by mouth daily.  . sertraline (ZOLOFT) 50 MG tablet TAKE ONE TABLET BY MOUTH EVERY DAY  . [DISCONTINUED] benzonatate (TESSALON) 100 MG capsule Take 1 capsule (100 mg total) by mouth 2 (two) times daily as needed for cough.  . [DISCONTINUED] Diphenhyd-Hydrocort-Nystatin (FIRST-DUKES MOUTHWASH) SUSP 5cc's swish and spit tis  . [DISCONTINUED] meloxicam (MOBIC) 15 MG tablet Take 15 mg by mouth daily.  . [DISCONTINUED] omeprazole (PRILOSEC) 20 MG capsule Take 20 mg by mouth daily.  . [DISCONTINUED] sertraline (ZOLOFT) 50 MG tablet Take 1 tablet (50 mg total) by mouth daily.   No facility-administered encounter medications on file as of 09/15/2016.     Review of Systems  Constitutional: Negative for appetite change and fever.  HENT: Negative for congestion and sinus pressure.   Respiratory: Negative for cough, chest tightness and shortness of breath.   Cardiovascular: Negative for chest pain, palpitations and leg swelling.  Gastrointestinal: Negative for abdominal pain, diarrhea, nausea and vomiting.  Genitourinary: Negative for difficulty urinating and dysuria.  Musculoskeletal: Negative for  joint swelling.       Hip is better.    Skin: Negative for color change and rash.  Neurological: Negative for dizziness, light-headedness and headaches.  Psychiatric/Behavioral: Negative for agitation and dysphoric mood.       Objective:     Blood pressure rechecked by me:  132/72  Physical Exam  Constitutional: She appears well-developed and well-nourished. No distress.  HENT:  Nose: Nose normal.  Mouth/Throat: Oropharynx is clear and moist.  Neck: Neck supple. No thyromegaly present.  Cardiovascular: Normal rate and regular rhythm.   Pulmonary/Chest: Breath sounds normal. No respiratory distress. She has no wheezes.  Abdominal: Soft. Bowel sounds are normal. There is no tenderness.  Musculoskeletal: She  exhibits no edema or tenderness.  Lymphadenopathy:    She has no cervical adenopathy.  Skin: No rash noted. No erythema.  Psychiatric: She has a normal mood and affect. Her behavior is normal.    BP 134/68 (BP Location: Left Arm, Patient Position: Sitting, Cuff Size: Normal)   Pulse 90   Temp 99 F (37.2 C) (Oral)   Resp 12   Ht 5\' 4"  (1.626 m)   Wt 138 lb (62.6 kg)   SpO2 96%   BMI 23.69 kg/m  Wt Readings from Last 3 Encounters:  09/15/16 138 lb (62.6 kg)  06/08/16 141 lb (64 kg)  04/28/16 144 lb 9.6 oz (65.6 kg)     Lab Results  Component Value Date   WBC 6.3 04/28/2016   HGB 12.8 04/28/2016   HCT 38.7 04/28/2016   PLT 311.0 04/28/2016   GLUCOSE 111 (H) 04/28/2016   CHOL 223 (H) 04/28/2016   TRIG 105.0 04/28/2016   HDL 58.90 04/28/2016   LDLDIRECT 130.6 11/26/2012   LDLCALC 143 (H) 04/28/2016   ALT 16 04/28/2016   AST 18 04/28/2016   NA 140 04/28/2016   K 4.4 04/28/2016   CL 105 04/28/2016   CREATININE 0.66 06/21/2016   BUN 16 04/28/2016   CO2 30 04/28/2016   TSH 0.98 04/28/2016   HGBA1C 5.5 05/27/2016    US Breast Ltd Uni Left Inc Axilla  Result Date: 09/13/2016 CLINICAL DATA:  Follow-up of probably benign left breast masses. History of benign left breast core needle biopsy demonstrating fibrocystic changes. EXAM: 2D DIGITAL DIAGNOSTIC BILATERAL MAMMOGRAM WITH CAD AND ADJUNCT TOMO ULTRASOUND LEFT BREAST COMPARISON:  Previous exam(s). ACR Breast Density Category b: There are scattered areas of fibroglandular density. FINDINGS: There are stable low-density mostly circumscribed few mm nodules in the left breast upper outer quadrant, middle and posterior depth. A subtle asymmetry is seen in the slightly medial left breast, middle depth on the craniocaudal view. Mammographic images were processed with CAD. On physical exam, no suspicious masses are palpated. Targeted left breast ultrasound is performed, showing no sonographic correlation to the subtle asymmetry in the  slightly medial left breast, middle depth. Hypoechoic circumscribed few mm benign-appearing nodules are seen in the left breast: 1 o'clock 4 cm from the nipple there is a 0.5 x 0.3 x 0.5 cm such nodule, a second left breast 1 o'clock 4 cm from the nipple there is such nodule measures 0.6 x 0.6 x 0.3 cm. In the left breast 3 o'clock 3 cm from the nipple similar in appearance nodule measures 0.3 x 0.3 x 0.2 cm. In the left breast 9 o'clock 3 cm from the nipple there is a benign-appearing hypoechoic circumscribed horizontally oriented nodule measuring 0.4 x 0.4 x 0.2 cm. This 9 o'clock nodule likely does not correspond  to the mammographically seen asymmetry. IMPRESSION: Probably benign left breast 1 and 3 o'clock nodules. Asymmetry in the slightly medial left breast, middle depth, without sonographic correlation. RECOMMENDATION: Diagnostic mammogram and possibly ultrasound of the left breast in 6 months. (Code:FI-L-5M) I have discussed the findings and recommendations with the patient. Results were also provided in writing at the conclusion of the visit. If applicable, a reminder letter will be sent to the patient regarding the next appointment. BI-RADS CATEGORY  3: Probably benign. Electronically Signed   By: Fidela Salisbury M.D.   On: 09/13/2016 12:32   Mm Diag Breast Tomo Bilateral  Result Date: 09/13/2016 CLINICAL DATA:  Follow-up of probably benign left breast masses. History of benign left breast core needle biopsy demonstrating fibrocystic changes. EXAM: 2D DIGITAL DIAGNOSTIC BILATERAL MAMMOGRAM WITH CAD AND ADJUNCT TOMO ULTRASOUND LEFT BREAST COMPARISON:  Previous exam(s). ACR Breast Density Category b: There are scattered areas of fibroglandular density. FINDINGS: There are stable low-density mostly circumscribed few mm nodules in the left breast upper outer quadrant, middle and posterior depth. A subtle asymmetry is seen in the slightly medial left breast, middle depth on the craniocaudal view.  Mammographic images were processed with CAD. On physical exam, no suspicious masses are palpated. Targeted left breast ultrasound is performed, showing no sonographic correlation to the subtle asymmetry in the slightly medial left breast, middle depth. Hypoechoic circumscribed few mm benign-appearing nodules are seen in the left breast: 1 o'clock 4 cm from the nipple there is a 0.5 x 0.3 x 0.5 cm such nodule, a second left breast 1 o'clock 4 cm from the nipple there is such nodule measures 0.6 x 0.6 x 0.3 cm. In the left breast 3 o'clock 3 cm from the nipple similar in appearance nodule measures 0.3 x 0.3 x 0.2 cm. In the left breast 9 o'clock 3 cm from the nipple there is a benign-appearing hypoechoic circumscribed horizontally oriented nodule measuring 0.4 x 0.4 x 0.2 cm. This 9 o'clock nodule likely does not correspond to the mammographically seen asymmetry. IMPRESSION: Probably benign left breast 1 and 3 o'clock nodules. Asymmetry in the slightly medial left breast, middle depth, without sonographic correlation. RECOMMENDATION: Diagnostic mammogram and possibly ultrasound of the left breast in 6 months. (Code:FI-L-5M) I have discussed the findings and recommendations with the patient. Results were also provided in writing at the conclusion of the visit. If applicable, a reminder letter will be sent to the patient regarding the next appointment. BI-RADS CATEGORY  3: Probably benign. Electronically Signed   By: Fidela Salisbury M.D.   On: 09/13/2016 12:32       Assessment & Plan:   Problem List Items Addressed This Visit    Abnormal mammogram    F/u mammogram and ultrasound 09/13/16.  Recommended 6 month f/u.        Hip pain    S/p surgery.  Doing well.  Continues f/u with ortho.        Hypercholesterolemia    Low cholesterol diet and exercise.  Follow lipid panel.        Relevant Orders   Hepatic function panel   Lipid panel   Basic metabolic panel   Osteoporosis    Received reclast.   Continue vitamin  D.        Relevant Orders   VITAMIN D 25 Hydroxy (Vit-D Deficiency, Fractures)   Stress    On zoloft and doing well.  Follow.         Other Visit Diagnoses  Hyperglycemia    -  Primary   Relevant Orders   Hemoglobin A1c       Einar Pheasant, MD

## 2016-09-17 ENCOUNTER — Encounter: Payer: Self-pay | Admitting: Internal Medicine

## 2016-09-17 NOTE — Assessment & Plan Note (Signed)
F/u mammogram and ultrasound 09/13/16.  Recommended 6 month f/u.

## 2016-09-17 NOTE — Assessment & Plan Note (Signed)
S/p surgery.  Doing well.  Continues f/u with ortho.

## 2016-09-17 NOTE — Assessment & Plan Note (Signed)
Received reclast.  Continue vitamin  D.

## 2016-09-17 NOTE — Assessment & Plan Note (Signed)
Low cholesterol diet and exercise.  Follow lipid panel.   

## 2016-09-17 NOTE — Assessment & Plan Note (Signed)
On zoloft and doing well.  Follow.   

## 2016-10-04 ENCOUNTER — Telehealth: Payer: Self-pay | Admitting: Internal Medicine

## 2016-10-04 ENCOUNTER — Other Ambulatory Visit: Payer: Medicare Other

## 2016-10-04 NOTE — Telephone Encounter (Signed)
Left pt message asking to call Allison back directly at [phone] to schedule AWV. Thanks! ° °*NOTE* Never had AWV before °

## 2016-10-10 ENCOUNTER — Other Ambulatory Visit: Payer: Self-pay | Admitting: Internal Medicine

## 2016-10-11 ENCOUNTER — Other Ambulatory Visit (INDEPENDENT_AMBULATORY_CARE_PROVIDER_SITE_OTHER): Payer: Medicare Other

## 2016-10-11 DIAGNOSIS — E7800 Pure hypercholesterolemia, unspecified: Secondary | ICD-10-CM

## 2016-10-11 DIAGNOSIS — R739 Hyperglycemia, unspecified: Secondary | ICD-10-CM | POA: Diagnosis not present

## 2016-10-11 DIAGNOSIS — M810 Age-related osteoporosis without current pathological fracture: Secondary | ICD-10-CM | POA: Diagnosis not present

## 2016-10-11 LAB — BASIC METABOLIC PANEL
BUN: 11 mg/dL (ref 6–23)
CALCIUM: 9.6 mg/dL (ref 8.4–10.5)
CHLORIDE: 106 meq/L (ref 96–112)
CO2: 31 mEq/L (ref 19–32)
Creatinine, Ser: 0.71 mg/dL (ref 0.40–1.20)
GFR: 87.29 mL/min (ref >60.00)
Glucose, Bld: 107 mg/dL — ABNORMAL HIGH (ref 70–99)
Potassium: 4.3 mEq/L (ref 3.5–5.1)
Sodium: 142 mEq/L (ref 135–145)

## 2016-10-11 LAB — HEPATIC FUNCTION PANEL
ALK PHOS: 95 U/L (ref 39–117)
ALT: 16 U/L (ref 0–35)
AST: 19 U/L (ref 0–37)
Albumin: 4.4 g/dL (ref 3.5–5.2)
BILIRUBIN DIRECT: 0.2 mg/dL (ref 0.0–0.3)
BILIRUBIN TOTAL: 0.9 mg/dL (ref 0.2–1.2)
Total Protein: 7.6 g/dL (ref 6.0–8.3)

## 2016-10-11 LAB — VITAMIN D 25 HYDROXY (VIT D DEFICIENCY, FRACTURES): VITD: 46.75 ng/mL (ref 30.00–100.00)

## 2016-10-11 LAB — LIPID PANEL
Cholesterol: 201 mg/dL — ABNORMAL HIGH (ref 0–200)
HDL: 61.5 mg/dL (ref >39.00)
LDL CALC: 121 mg/dL — AB (ref 0–99)
NONHDL: 139.08
Total CHOL/HDL Ratio: 3
Triglycerides: 92 mg/dL (ref 0.0–149.0)
VLDL: 18.4 mg/dL (ref 0.0–40.0)

## 2016-10-11 LAB — HEMOGLOBIN A1C: HEMOGLOBIN A1C: 5.4 % (ref 4.6–6.5)

## 2016-11-09 NOTE — Telephone Encounter (Signed)
Called pt to schedule AWV.  No answer, No VM  No hx of AWV

## 2016-12-19 ENCOUNTER — Encounter: Payer: Self-pay | Admitting: Internal Medicine

## 2016-12-19 ENCOUNTER — Ambulatory Visit (INDEPENDENT_AMBULATORY_CARE_PROVIDER_SITE_OTHER): Payer: Medicare Other | Admitting: Internal Medicine

## 2016-12-19 VITALS — BP 124/68 | HR 74 | Temp 98.6°F | Resp 14 | Ht 64.0 in | Wt 139.8 lb

## 2016-12-19 DIAGNOSIS — R739 Hyperglycemia, unspecified: Secondary | ICD-10-CM

## 2016-12-19 DIAGNOSIS — R928 Other abnormal and inconclusive findings on diagnostic imaging of breast: Secondary | ICD-10-CM

## 2016-12-19 DIAGNOSIS — E7800 Pure hypercholesterolemia, unspecified: Secondary | ICD-10-CM

## 2016-12-19 DIAGNOSIS — F439 Reaction to severe stress, unspecified: Secondary | ICD-10-CM

## 2016-12-19 DIAGNOSIS — M810 Age-related osteoporosis without current pathological fracture: Secondary | ICD-10-CM | POA: Diagnosis not present

## 2016-12-19 DIAGNOSIS — Z23 Encounter for immunization: Secondary | ICD-10-CM | POA: Diagnosis not present

## 2016-12-19 MED ORDER — SERTRALINE HCL 50 MG PO TABS: 50.0000 mg | ORAL_TABLET | Freq: Every day | ORAL | 3 refills | Status: DC

## 2016-12-19 NOTE — Progress Notes (Signed)
Patient ID: Theresa Francis, female   DOB: 01/30/1951, 66 y.o.   MRN: [id number]   Subjective:    Patient ID: Theresa Francis, female    DOB: [birth year], 66 y.o.   MRN: [id number]  HPI  Patient here for a scheduled follow up.  She is s/p hip surgery.  Has done well.  Is exercising.  Stays active.  Feels better.  No chest pain.  No sob.  No acid reflux.  No abdominal pain.  Bowels moving.  Planning for 6 month f/u left breast.  Overall she feels she is doing well.  Handling stress.     Past Medical History:  Diagnosis Date  . Abnormal Pap smear    s/p conization.   . Basal cell carcinoma    eye lid  . History of chicken pox   . History of shingles   . Osteoarthritis   . Urine incontinence    H/O occasional   Past Surgical History:  Procedure Laterality Date  . BREAST BIOPSY Left 11/03/2014   neg./ done by Dr. Bary Castilla in office  . BREAST CYST ASPIRATION Left   . COLONOSCOPY  2008   Dr Sonny Masters  . 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 FINE NEEDLE ASPIRATION Left 2010   Dr Bary Castilla  . TONSILLECTOMY  1967   Family History  Problem Relation Age of Onset  . Breast cancer Mother 75  . Heart disease Father    Social History   Social History  . Marital status: Married    Spouse name: N/A  . Number of children: 3  . Years of education: N/A   Social History Main Topics  . Smoking status: Never Smoker  . Smokeless tobacco: Never Used  . Alcohol use 0.0 oz/week     Comment: drinks wine occasionally  . Drug use: No  . Sexual activity: Not Asked   Other Topics Concern  . None   Social History Narrative  . None    Outpatient Encounter Prescriptions as of 12/19/2016  Medication Sig  . acetaminophen (TYLENOL) 650 MG CR tablet Take 1,300 mg by mouth every 6 (six) hours as needed for pain.  . Calcium Carbonate-Vitamin D (CALTRATE 600+D PO) Take by mouth daily.  . Cholecalciferol (D3 SUPER STRENGTH) 2000 units CAPS Take 1 capsule by mouth daily.  .  sertraline (ZOLOFT) 50 MG tablet Take 1 tablet (50 mg total) by mouth daily.  . [DISCONTINUED] sertraline (ZOLOFT) 50 MG tablet TAKE 1 TABLET BY MOUTH DAILY   No facility-administered encounter medications on file as of 12/19/2016.     Review of Systems  Constitutional: Negative for appetite change and unexpected weight change.  HENT: Negative for congestion and sinus pressure.   Respiratory: Negative for cough, chest tightness and shortness of breath.   Cardiovascular: Negative for chest pain, palpitations and leg swelling.  Gastrointestinal: Negative for abdominal pain, diarrhea, nausea and vomiting.  Genitourinary: Negative for difficulty urinating and dysuria.  Musculoskeletal: Negative for myalgias.       Hip doing better.    Skin: Negative for color change and rash.  Neurological: Negative for dizziness, light-headedness and headaches.  Psychiatric/Behavioral: Negative for agitation and dysphoric mood.       Objective:     Blood pressure rechecked by me:  132/72  Physical Exam  Constitutional: She appears well-developed and well-nourished. No distress.  HENT:  Nose: Nose normal.  Mouth/Throat: Oropharynx is clear and moist.  Neck: Neck supple. No thyromegaly present.  Cardiovascular: Normal rate and regular rhythm.   Pulmonary/Chest: Breath sounds normal. No respiratory distress. She has no wheezes.  Abdominal: Soft. Bowel sounds are normal. There is no tenderness.  Musculoskeletal: She exhibits no edema or tenderness.  Lymphadenopathy:    She has no cervical adenopathy.  Skin: No rash noted. No erythema.  Psychiatric: She has a normal mood and affect. Her behavior is normal.    BP 124/68 (BP Location: Left Arm, Patient Position: Sitting, Cuff Size: Normal)   Pulse 74   Temp 98.6 F (37 C) (Oral)   Resp 14   Ht 5\' 4"  (1.626 m)   Wt 139 lb 12.8 oz (63.4 kg)   SpO2 96%   BMI 24.00 kg/m  Wt Readings from Last 3 Encounters:  12/19/16 139 lb 12.8 oz (63.4 kg)    09/15/16 138 lb (62.6 kg)  06/08/16 141 lb (64 kg)     Lab Results  Component Value Date   WBC 6.3 04/28/2016   HGB 12.8 04/28/2016   HCT 38.7 04/28/2016   PLT 311.0 04/28/2016   GLUCOSE 107 (H) 10/11/2016   CHOL 201 (H) 10/11/2016   TRIG 92.0 10/11/2016   HDL 61.50 10/11/2016   LDLDIRECT 130.6 11/26/2012   LDLCALC 121 (H) 10/11/2016   ALT 16 10/11/2016   AST 19 10/11/2016   NA 142 10/11/2016   K 4.3 10/11/2016   CL 106 10/11/2016   CREATININE 0.71 10/11/2016   BUN 11 10/11/2016   CO2 31 10/11/2016   TSH 0.98 04/28/2016   HGBA1C 5.4 10/11/2016    US Breast Ltd Uni Left Inc Axilla  Result Date: 09/13/2016 CLINICAL DATA:  Follow-up of probably benign left breast masses. History of benign left breast core needle biopsy demonstrating fibrocystic changes. EXAM: 2D DIGITAL DIAGNOSTIC BILATERAL MAMMOGRAM WITH CAD AND ADJUNCT TOMO ULTRASOUND LEFT BREAST COMPARISON:  Previous exam(s). ACR Breast Density Category b: There are scattered areas of fibroglandular density. FINDINGS: There are stable low-density mostly circumscribed few mm nodules in the left breast upper outer quadrant, middle and posterior depth. A subtle asymmetry is seen in the slightly medial left breast, middle depth on the craniocaudal view. Mammographic images were processed with CAD. On physical exam, no suspicious masses are palpated. Targeted left breast ultrasound is performed, showing no sonographic correlation to the subtle asymmetry in the slightly medial left breast, middle depth. Hypoechoic circumscribed few mm benign-appearing nodules are seen in the left breast: 1 o'clock 4 cm from the nipple there is a 0.5 x 0.3 x 0.5 cm such nodule, a second left breast 1 o'clock 4 cm from the nipple there is such nodule measures 0.6 x 0.6 x 0.3 cm. In the left breast 3 o'clock 3 cm from the nipple similar in appearance nodule measures 0.3 x 0.3 x 0.2 cm. In the left breast 9 o'clock 3 cm from the nipple there is a  benign-appearing hypoechoic circumscribed horizontally oriented nodule measuring 0.4 x 0.4 x 0.2 cm. This 9 o'clock nodule likely does not correspond to the mammographically seen asymmetry. IMPRESSION: Probably benign left breast 1 and 3 o'clock nodules. Asymmetry in the slightly medial left breast, middle depth, without sonographic correlation. RECOMMENDATION: Diagnostic mammogram and possibly ultrasound of the left breast in 6 months. (Code:FI-L-85M) I have discussed the findings and recommendations with the patient. Results were also provided in writing at the conclusion of the visit. If applicable, a reminder letter will be sent to the patient regarding the next appointment. BI-RADS CATEGORY  3: Probably benign. Electronically  Signed   By: Fidela Salisbury M.D.   On: 09/13/2016 12:32   Mm Diag Breast Tomo Bilateral  Result Date: 09/13/2016 CLINICAL DATA:  Follow-up of probably benign left breast masses. History of benign left breast core needle biopsy demonstrating fibrocystic changes. EXAM: 2D DIGITAL DIAGNOSTIC BILATERAL MAMMOGRAM WITH CAD AND ADJUNCT TOMO ULTRASOUND LEFT BREAST COMPARISON:  Previous exam(s). ACR Breast Density Category b: There are scattered areas of fibroglandular density. FINDINGS: There are stable low-density mostly circumscribed few mm nodules in the left breast upper outer quadrant, middle and posterior depth. A subtle asymmetry is seen in the slightly medial left breast, middle depth on the craniocaudal view. Mammographic images were processed with CAD. On physical exam, no suspicious masses are palpated. Targeted left breast ultrasound is performed, showing no sonographic correlation to the subtle asymmetry in the slightly medial left breast, middle depth. Hypoechoic circumscribed few mm benign-appearing nodules are seen in the left breast: 1 o'clock 4 cm from the nipple there is a 0.5 x 0.3 x 0.5 cm such nodule, a second left breast 1 o'clock 4 cm from the nipple there is such  nodule measures 0.6 x 0.6 x 0.3 cm. In the left breast 3 o'clock 3 cm from the nipple similar in appearance nodule measures 0.3 x 0.3 x 0.2 cm. In the left breast 9 o'clock 3 cm from the nipple there is a benign-appearing hypoechoic circumscribed horizontally oriented nodule measuring 0.4 x 0.4 x 0.2 cm. This 9 o'clock nodule likely does not correspond to the mammographically seen asymmetry. IMPRESSION: Probably benign left breast 1 and 3 o'clock nodules. Asymmetry in the slightly medial left breast, middle depth, without sonographic correlation. RECOMMENDATION: Diagnostic mammogram and possibly ultrasound of the left breast in 6 months. (Code:FI-L-74M) I have discussed the findings and recommendations with the patient. Results were also provided in writing at the conclusion of the visit. If applicable, a reminder letter will be sent to the patient regarding the next appointment. BI-RADS CATEGORY  3: Probably benign. Electronically Signed   By: Fidela Salisbury M.D.   On: 09/13/2016 12:32       Assessment & Plan:   Problem List Items Addressed This Visit    Abnormal mammogram - Primary    Mammogram as outlined - 08/2016.   Recommended f/u diagnostic mammogram and possible ultrasound of left breast in 6 months.        Relevant Orders   MM Digital Diagnostic Unilat L   US BREAST LTD UNI LEFT INC AXILLA   MM DIAG BREAST TOMO UNI LEFT   Hyperbilirubinemia    Bilirubin level checked 09/2016 - wnl.        Hypercholesterolemia    Low cholesterol diet and exercise.  Follow lipid panel.        Relevant Orders   Hepatic function panel   Lipid panel   Basic metabolic panel   Osteoporosis    Received reclast.  Followed by Dr Jefm Bryant.        Stress    Discussed with her today.  Doing better.  Follow.         Other Visit Diagnoses    Need for pneumococcal vaccination       Hyperglycemia       Relevant Orders   Hemoglobin A1c       Einar Pheasant, MD

## 2016-12-21 ENCOUNTER — Telehealth: Payer: Self-pay | Admitting: Internal Medicine

## 2016-12-21 NOTE — Telephone Encounter (Signed)
Please advise 

## 2016-12-21 NOTE — Telephone Encounter (Signed)
Dr. Sherrilee Gilles office called and said that it is time for this patient to get her reclast injection. They need an updated bone density and labs before they can give it to her. They are requesting calcium, creatnine and vitamin D. They are requesting Dr. Nicki Reaper to order all these.

## 2016-12-22 ENCOUNTER — Encounter: Payer: Self-pay | Admitting: Internal Medicine

## 2016-12-22 NOTE — Assessment & Plan Note (Signed)
Received reclast.  Followed by Dr [facility].   

## 2016-12-22 NOTE — Assessment & Plan Note (Signed)
Discussed with her today.  Doing better.  Follow.

## 2016-12-22 NOTE — Assessment & Plan Note (Signed)
Mammogram as outlined - 08/2016.   Recommended f/u diagnostic mammogram and possible ultrasound of left breast in 6 months.

## 2016-12-22 NOTE — Telephone Encounter (Signed)
Reviewed chart.  She just had these labs in 09/2016.  Please forward labs to them and confirm they will schedule appt.

## 2016-12-22 NOTE — Assessment & Plan Note (Signed)
Bilirubin level checked 09/2016 - wnl.

## 2016-12-22 NOTE — Telephone Encounter (Signed)
Patient informed voiced understanding. Lab appointment made and results letter mailed.

## 2016-12-22 NOTE — Assessment & Plan Note (Signed)
Low cholesterol diet and exercise.  Follow lipid panel.   

## 2017-01-19 ENCOUNTER — Telehealth: Payer: Self-pay | Admitting: Internal Medicine

## 2017-01-19 DIAGNOSIS — M810 Age-related osteoporosis without current pathological fracture: Secondary | ICD-10-CM

## 2017-01-19 NOTE — Telephone Encounter (Signed)
Ok.  Hold on reclast for now since has had for the last 3 years.  Does need repeat bone density.

## 2017-01-19 NOTE — Telephone Encounter (Signed)
Please advise 

## 2017-01-19 NOTE — Telephone Encounter (Signed)
Patient had 3 infusions. 1 per year 2015, 2016, 2017. Left message to call back about bone density.

## 2017-01-19 NOTE — Telephone Encounter (Signed)
Copied from [place] #[id number]. Topic: Quick Communication - See Telephone Encounter >> Jan 19, 2017  9:33 AM Ether Griffins B wrote: CRM for notification. See Telephone encounter for:  Theresa Francis with [facility] calling in regards to pt. Wanting to know if you would like her to continue with infusions. She would need a bone density and some lab(creatin and calcium) work done. [phone] 01/19/17.

## 2017-01-19 NOTE — Telephone Encounter (Signed)
I have placed the order for the bone density.  Do you mind scheduling?  Thanks

## 2017-01-19 NOTE — Telephone Encounter (Signed)
Patient was agreeable to follow up bone density

## 2017-01-19 NOTE — Telephone Encounter (Signed)
Please confirm with [facility], if they have documented how many infusions she has had.  Also, let pt know that I would like to schedule her for a f/u bone density.  Let me know if agreeable.

## 2017-01-24 ENCOUNTER — Encounter: Payer: Self-pay | Admitting: Internal Medicine

## 2017-03-07 ENCOUNTER — Ambulatory Visit
Admission: RE | Admit: 2017-03-07 | Discharge: 2017-03-07 | Disposition: A | Discharge status: Home / Self Care | Payer: Medicare Other | Source: Ambulatory Visit | Attending: Internal Medicine | Admitting: Internal Medicine

## 2017-03-07 DIAGNOSIS — M85832 Other specified disorders of bone density and structure, left forearm: Secondary | ICD-10-CM | POA: Diagnosis not present

## 2017-03-07 DIAGNOSIS — M810 Age-related osteoporosis without current pathological fracture: Secondary | ICD-10-CM | POA: Diagnosis present

## 2017-03-07 DIAGNOSIS — M8588 Other specified disorders of bone density and structure, other site: Secondary | ICD-10-CM | POA: Insufficient documentation

## 2017-03-08 ENCOUNTER — Encounter: Payer: Self-pay | Admitting: Internal Medicine

## 2017-03-14 ENCOUNTER — Encounter: Payer: Self-pay | Admitting: Internal Medicine

## 2017-03-14 NOTE — Telephone Encounter (Signed)
Unread mychart message mailed to patient 

## 2017-03-16 ENCOUNTER — Ambulatory Visit
Admission: RE | Admit: 2017-03-16 | Discharge: 2017-03-16 | Disposition: A | Discharge status: Home / Self Care | Payer: Medicare Other | Source: Ambulatory Visit | Attending: Internal Medicine | Admitting: Internal Medicine

## 2017-03-16 DIAGNOSIS — R928 Other abnormal and inconclusive findings on diagnostic imaging of breast: Secondary | ICD-10-CM

## 2017-03-16 DIAGNOSIS — N6323 Unspecified lump in the left breast, lower outer quadrant: Secondary | ICD-10-CM | POA: Diagnosis not present

## 2017-03-16 DIAGNOSIS — N6321 Unspecified lump in the left breast, upper outer quadrant: Secondary | ICD-10-CM | POA: Insufficient documentation

## 2017-03-17 ENCOUNTER — Other Ambulatory Visit: Payer: Self-pay | Admitting: Internal Medicine

## 2017-03-17 ENCOUNTER — Encounter: Payer: Self-pay | Admitting: Internal Medicine

## 2017-03-17 DIAGNOSIS — R928 Other abnormal and inconclusive findings on diagnostic imaging of breast: Secondary | ICD-10-CM

## 2017-03-17 NOTE — Progress Notes (Signed)
Order placed for f/u bilateral diagnostic mammogram with left breast ultrasound.

## 2017-05-08 ENCOUNTER — Other Ambulatory Visit (INDEPENDENT_AMBULATORY_CARE_PROVIDER_SITE_OTHER): Payer: Medicare Other

## 2017-05-08 DIAGNOSIS — E7800 Pure hypercholesterolemia, unspecified: Secondary | ICD-10-CM | POA: Diagnosis not present

## 2017-05-08 DIAGNOSIS — R739 Hyperglycemia, unspecified: Secondary | ICD-10-CM

## 2017-05-08 LAB — BASIC METABOLIC PANEL
BUN: 16 mg/dL (ref 6–23)
CHLORIDE: 103 meq/L (ref 96–112)
CO2: 28 mEq/L (ref 19–32)
Calcium: 10 mg/dL (ref 8.4–10.5)
Creatinine, Ser: 0.73 mg/dL (ref 0.40–1.20)
GFR: 84.39 mL/min (ref >60.00)
Glucose, Bld: 109 mg/dL — ABNORMAL HIGH (ref 70–99)
Potassium: 4.2 mEq/L (ref 3.5–5.1)
SODIUM: 139 meq/L (ref 135–145)

## 2017-05-08 LAB — HEPATIC FUNCTION PANEL
ALBUMIN: 4.7 g/dL (ref 3.5–5.2)
ALT: 14 U/L (ref 0–35)
AST: 18 U/L (ref 0–37)
Alkaline Phosphatase: 67 U/L (ref 39–117)
BILIRUBIN TOTAL: 1.7 mg/dL — AB (ref 0.2–1.2)
Bilirubin, Direct: 0.2 mg/dL (ref 0.0–0.3)
TOTAL PROTEIN: 7.6 g/dL (ref 6.0–8.3)

## 2017-05-08 LAB — HEMOGLOBIN A1C: HEMOGLOBIN A1C: 5.6 % (ref 4.6–6.5)

## 2017-05-08 LAB — LIPID PANEL
Cholesterol: 189 mg/dL (ref 0–200)
HDL: 63.4 mg/dL (ref >39.00)
LDL CALC: 110 mg/dL — AB (ref 0–99)
NonHDL: 125.22
TRIGLYCERIDES: 77 mg/dL (ref 0.0–149.0)
Total CHOL/HDL Ratio: 3
VLDL: 15.4 mg/dL (ref 0.0–40.0)

## 2017-05-09 ENCOUNTER — Encounter: Payer: Self-pay | Admitting: Internal Medicine

## 2017-05-15 ENCOUNTER — Other Ambulatory Visit: Payer: Medicare Other

## 2017-05-15 ENCOUNTER — Ambulatory Visit (INDEPENDENT_AMBULATORY_CARE_PROVIDER_SITE_OTHER): Payer: Medicare Other | Admitting: Internal Medicine

## 2017-05-15 ENCOUNTER — Encounter: Payer: Self-pay | Admitting: Internal Medicine

## 2017-05-15 VITALS — BP 134/68 | HR 76 | Temp 98.5°F | Resp 18 | Ht 64.0 in | Wt 145.0 lb

## 2017-05-15 DIAGNOSIS — R739 Hyperglycemia, unspecified: Secondary | ICD-10-CM | POA: Diagnosis not present

## 2017-05-15 DIAGNOSIS — Z0000 Encounter for general adult medical examination without abnormal findings: Secondary | ICD-10-CM

## 2017-05-15 DIAGNOSIS — E7800 Pure hypercholesterolemia, unspecified: Secondary | ICD-10-CM | POA: Diagnosis not present

## 2017-05-15 DIAGNOSIS — R928 Other abnormal and inconclusive findings on diagnostic imaging of breast: Secondary | ICD-10-CM

## 2017-05-15 DIAGNOSIS — R7989 Other specified abnormal findings of blood chemistry: Secondary | ICD-10-CM

## 2017-05-15 DIAGNOSIS — R945 Abnormal results of liver function studies: Secondary | ICD-10-CM

## 2017-05-15 DIAGNOSIS — M810 Age-related osteoporosis without current pathological fracture: Secondary | ICD-10-CM | POA: Diagnosis not present

## 2017-05-15 MED ORDER — SERTRALINE HCL 50 MG PO TABS: 50.0000 mg | ORAL_TABLET | Freq: Every day | ORAL | 3 refills | Status: DC

## 2017-05-15 NOTE — Assessment & Plan Note (Addendum)
Physical today 05/15/17.  PAP 05/01/16 - negative with negative HPV (low cellularity).  cologuard 05/12/16 - negative.

## 2017-05-15 NOTE — Progress Notes (Signed)
Patient ID: Theresa Francis, female   DOB: 19-Oct-1950, 67 y.o.   MRN: [id number]   Subjective:    Patient ID: Theresa Francis, female    DOB: [birth year], 67 y.o.   MRN: [id number]  HPI  Patient here for her physical exam.  She reports she is doing well.  Feels good.  Staying active.  No chest pain.  No sob.  No acid reflux.  No abdominal pain.  Bowels moving.  Discussed lab results.  Discussed cholesterol results.  Discussed calculated cholesterol risk - 7%.  Hips - doing well.     Past Medical History:  Diagnosis Date  . Abnormal Pap smear    s/p conization.   . Basal cell carcinoma    eye lid  . History of chicken pox   . History of shingles   . Osteoarthritis   . Urine incontinence    H/O occasional   Past Surgical History:  Procedure Laterality Date  . BREAST BIOPSY Left 11/03/2014   neg./ done by Dr. Bary Castilla in office  . BREAST CYST ASPIRATION Left   . COLONOSCOPY  2008   Dr Sonny Masters  . 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 FINE NEEDLE ASPIRATION Left 2010   Dr Bary Castilla  . TONSILLECTOMY  1967   Family History  Problem Relation Age of Onset  . Breast cancer Mother 18  . Heart disease Father    Social History   Socioeconomic History  . Marital status: Married    Spouse name: Not on file  . Number of children: 3  . Years of education: Not on file  . Highest education level: Not on file  Occupational History  . Not on file  Social Needs  . Financial resource strain: Not on file  . Food insecurity:    Worry: Not on file    Inability: Not on file  . Transportation needs:    Medical: Not on file    Non-medical: Not on file  Tobacco Use  . Smoking status: Never Smoker  . Smokeless tobacco: Never Used  Substance and Sexual Activity  . Alcohol use: Yes    Alcohol/week: 0.0 oz    Comment: drinks wine occasionally  . Drug use: No  . Sexual activity: Not on file  Lifestyle  . Physical activity:    Days per week: Not on file    Minutes per  session: Not on file  . Stress: Not on file  Relationships  . Social connections:    Talks on phone: Not on file    Gets together: Not on file    Attends religious service: Not on file    Active member of club or organization: Not on file    Attends meetings of clubs or organizations: Not on file    Relationship status: Not on file  Other Topics Concern  . Not on file  Social History Narrative  . Not on file    Outpatient Encounter Medications as of 05/15/2017  Medication Sig  . acetaminophen (TYLENOL) 650 MG CR tablet Take 1,300 mg by mouth every 6 (six) hours as needed for pain.  . Calcium Carbonate-Vitamin D (CALTRATE 600+D PO) Take by mouth daily.  . Cholecalciferol (D3 SUPER STRENGTH) 2000 units CAPS Take 1 capsule by mouth daily.  . sertraline (ZOLOFT) 50 MG tablet Take 1 tablet (50 mg total) by mouth daily.  . [DISCONTINUED] sertraline (ZOLOFT) 50 MG tablet Take 1 tablet (50 mg total) by mouth daily.  No facility-administered encounter medications on file as of 05/15/2017.     Review of Systems  Constitutional: Negative for appetite change and unexpected weight change.  HENT: Negative for congestion and sinus pressure.   Eyes: Negative for pain and visual disturbance.  Respiratory: Negative for cough, chest tightness and shortness of breath.   Cardiovascular: Negative for chest pain, palpitations and leg swelling.  Gastrointestinal: Negative for abdominal pain, diarrhea, nausea and vomiting.  Genitourinary: Negative for difficulty urinating and dysuria.  Musculoskeletal: Negative for joint swelling and myalgias.  Skin: Negative for color change and rash.  Neurological: Negative for dizziness, light-headedness and headaches.  Hematological: Negative for adenopathy. Does not bruise/bleed easily.  Psychiatric/Behavioral: Negative for agitation and dysphoric mood.       Objective:    Physical Exam  Constitutional: She is oriented to person, place, and time. She appears  well-developed and well-nourished. No distress.  HENT:  Nose: Nose normal.  Mouth/Throat: Oropharynx is clear and moist.  Eyes: Right eye exhibits no discharge. Left eye exhibits no discharge. No scleral icterus.  Neck: Neck supple. No thyromegaly present.  Cardiovascular: Normal rate and regular rhythm.  Pulmonary/Chest: Breath sounds normal. No accessory muscle usage. No tachypnea. No respiratory distress. She has no decreased breath sounds. She has no wheezes. She has no rhonchi. Right breast exhibits no inverted nipple, no mass, no nipple discharge and no tenderness (no axillary adenopathy). Left breast exhibits no inverted nipple, no mass, no nipple discharge and no tenderness (no axilarry adenopathy).  Abdominal: Soft. Bowel sounds are normal. There is no tenderness.  Musculoskeletal: She exhibits no edema or tenderness.  Lymphadenopathy:    She has no cervical adenopathy.  Neurological: She is alert and oriented to person, place, and time.  Skin: Skin is warm. No rash noted. No erythema.  Psychiatric: She has a normal mood and affect. Her behavior is normal.    BP 134/68 (BP Location: Left Arm, Patient Position: Sitting, Cuff Size: Normal)   Pulse 76   Temp 98.5 F (36.9 C) (Oral)   Resp 18   Ht 5\' 4"  (1.626 m)   Wt 145 lb (65.8 kg)   SpO2 95%   BMI 24.89 kg/m  Wt Readings from Last 3 Encounters:  05/15/17 145 lb (65.8 kg)  12/19/16 139 lb 12.8 oz (63.4 kg)  09/15/16 138 lb (62.6 kg)     Lab Results  Component Value Date   WBC 6.3 04/28/2016   HGB 12.8 04/28/2016   HCT 38.7 04/28/2016   PLT 311.0 04/28/2016   GLUCOSE 109 (H) 05/08/2017   CHOL 189 05/08/2017   TRIG 77.0 05/08/2017   HDL 63.40 05/08/2017   LDLDIRECT 130.6 11/26/2012   LDLCALC 110 (H) 05/08/2017   ALT 14 05/08/2017   AST 18 05/08/2017   NA 139 05/08/2017   K 4.2 05/08/2017   CL 103 05/08/2017   CREATININE 0.73 05/08/2017   BUN 16 05/08/2017   CO2 28 05/08/2017   TSH 0.98 04/28/2016   HGBA1C  5.6 05/08/2017    US Breast [place] Axilla  Result Date: 03/16/2017 CLINICAL DATA:  Follow-up probably benign nodules in the 1 o'clock and 3 o'clock positions of the left breast and probably benign asymmetry in the medial left breast with no sonographic correlate. EXAM: 2D DIGITAL DIAGNOSTIC LEFT MAMMOGRAM WITH CAD AND ADJUNCT TOMO ULTRASOUND LEFT BREAST COMPARISON:  Previous exam(s). ACR Breast Density Category c: The breast tissue is heterogeneously dense, which may obscure small masses. FINDINGS: The previously demonstrated  nodular densities in the left breast are no longer seen on today's mammogram images. The previously seen probably benign asymmetry in the slightly medial left breast is also no longer demonstrated. There are no interval findings suspicious for malignancy. Mammographic images were processed with CAD. Targeted ultrasound is performed, showing a 6 x 5 x 3 mm oval, horizontally oriented, circumscribed, hypoechoic mass in the 1 o'clock position of the left breast, 4 cm from the nipple. This measured 6 x 6 x 3 mm on 09/13/2016. This has no internal blood flow with color Doppler and with harmonics has the appearance of a cyst containing a thin internal septation. Also in the 1 o'clock position of the left breast, 4 cm from the nipple, a 5 x 5 x 3 mm similar-appearing mass is demonstrated, previously measuring 5 x 5 x 3 mm. In the 3 o'clock position, 3 cm from the nipple, a 4 x 3 x 3 mm similar-appearing mass is demonstrated, previously 3 x 3 x 2 mm. IMPRESSION: 1. Improved mammographic appearance of the left breast with no residual mammographically visible nodules or asymmetry. 2. No significant change in 2 probably benign masses in the 1 o'clock position of the left breast and 1 probably benign mass in the 3 o'clock position of the left breast. RECOMMENDATION: Bilateral diagnostic mammogram and left breast ultrasound in 6 months. I have discussed the findings and recommendations with  the patient. Results were also provided in writing at the conclusion of the visit. If applicable, a reminder letter will be sent to the patient regarding the next appointment. BI-RADS CATEGORY  3: Probably benign. Electronically Signed   By: Claudie Revering M.D.   On: 03/16/2017 12:35   Mm Diag Breast Tomo Uni Left  Result Date: 03/16/2017 CLINICAL DATA:  Follow-up probably benign nodules in the 1 o'clock and 3 o'clock positions of the left breast and probably benign asymmetry in the medial left breast with no sonographic correlate. EXAM: 2D DIGITAL DIAGNOSTIC LEFT MAMMOGRAM WITH CAD AND ADJUNCT TOMO ULTRASOUND LEFT BREAST COMPARISON:  Previous exam(s). ACR Breast Density Category c: The breast tissue is heterogeneously dense, which may obscure small masses. FINDINGS: The previously demonstrated nodular densities in the left breast are no longer seen on today's mammogram images. The previously seen probably benign asymmetry in the slightly medial left breast is also no longer demonstrated. There are no interval findings suspicious for malignancy. Mammographic images were processed with CAD. Targeted ultrasound is performed, showing a 6 x 5 x 3 mm oval, horizontally oriented, circumscribed, hypoechoic mass in the 1 o'clock position of the left breast, 4 cm from the nipple. This measured 6 x 6 x 3 mm on 09/13/2016. This has no internal blood flow with color Doppler and with harmonics has the appearance of a cyst containing a thin internal septation. Also in the 1 o'clock position of the left breast, 4 cm from the nipple, a 5 x 5 x 3 mm similar-appearing mass is demonstrated, previously measuring 5 x 5 x 3 mm. In the 3 o'clock position, 3 cm from the nipple, a 4 x 3 x 3 mm similar-appearing mass is demonstrated, previously 3 x 3 x 2 mm. IMPRESSION: 1. Improved mammographic appearance of the left breast with no residual mammographically visible nodules or asymmetry. 2. No significant change in 2 probably benign masses  in the 1 o'clock position of the left breast and 1 probably benign mass in the 3 o'clock position of the left breast. RECOMMENDATION: Bilateral diagnostic mammogram and left  breast ultrasound in 6 months. I have discussed the findings and recommendations with the patient. Results were also provided in writing at the conclusion of the visit. If applicable, a reminder letter will be sent to the patient regarding the next appointment. BI-RADS CATEGORY  3: Probably benign. Electronically Signed   By: Claudie Revering M.D.   On: 03/16/2017 12:35       Assessment & Plan:   Problem List Items Addressed This Visit    Abnormal liver function tests    Follow liver panel.       Abnormal mammogram    Last mammogram 02/2017 - bilateral diagnostic mammogram with left breast ultrasound.  Recommended 6 month f/u.  States is scheduled.        Health care maintenance    Physical today 05/15/17.  PAP 05/01/16 - negative with negative HPV (low cellularity).  cologuard 05/12/16 - negative.        Hypercholesterolemia    Low cholesterol diet and exercise.  Follow lipid panel.       Relevant Orders   CBC with Differential/Platelet   Hepatic function panel   Lipid panel   TSH   Basic metabolic panel   Osteoporosis    Followed by Dr Jefm Bryant.  Received reclast.         Other Visit Diagnoses    Routine general medical examination at a health care facility    -  Primary   Hyperglycemia       Relevant Orders   Hemoglobin A1c       Einar Pheasant, MD

## 2017-05-19 ENCOUNTER — Encounter: Payer: Medicare Other | Admitting: Internal Medicine

## 2017-05-20 ENCOUNTER — Encounter: Payer: Self-pay | Admitting: Internal Medicine

## 2017-05-20 NOTE — Assessment & Plan Note (Signed)
Low cholesterol diet and exercise.  Follow lipid panel.   

## 2017-05-20 NOTE — Assessment & Plan Note (Signed)
Follow liver panel.  

## 2017-05-20 NOTE — Assessment & Plan Note (Signed)
Followed by Dr Jefm Bryant.  Received reclast.

## 2017-05-20 NOTE — Assessment & Plan Note (Signed)
Last mammogram 02/2017 - bilateral diagnostic mammogram with left breast ultrasound.  Recommended 6 month f/u.  States is scheduled.

## 2017-06-14 ENCOUNTER — Other Ambulatory Visit: Payer: Medicare Other

## 2017-09-12 ENCOUNTER — Ambulatory Visit (INDEPENDENT_AMBULATORY_CARE_PROVIDER_SITE_OTHER): Payer: Medicare Other

## 2017-09-12 ENCOUNTER — Other Ambulatory Visit (INDEPENDENT_AMBULATORY_CARE_PROVIDER_SITE_OTHER): Payer: Medicare Other

## 2017-09-12 VITALS — BP 122/62 | HR 79 | Temp 98.4°F | Resp 12 | Ht 64.0 in | Wt 142.1 lb

## 2017-09-12 DIAGNOSIS — Z0000 Encounter for general adult medical examination without abnormal findings: Secondary | ICD-10-CM

## 2017-09-12 DIAGNOSIS — Z1159 Encounter for screening for other viral diseases: Secondary | ICD-10-CM | POA: Diagnosis not present

## 2017-09-12 DIAGNOSIS — R739 Hyperglycemia, unspecified: Secondary | ICD-10-CM | POA: Diagnosis not present

## 2017-09-12 DIAGNOSIS — E7800 Pure hypercholesterolemia, unspecified: Secondary | ICD-10-CM

## 2017-09-12 LAB — CBC WITH DIFFERENTIAL/PLATELET
Basophils Absolute: 0.1 10*3/uL (ref 0.0–0.1)
Basophils Relative: 1 % (ref 0.0–3.0)
Eosinophils Absolute: 0.1 10*3/uL (ref 0.0–0.7)
Eosinophils Relative: 1.5 % (ref 0.0–5.0)
HCT: 38.9 % (ref 36.0–46.0)
Hemoglobin: 12.8 g/dL (ref 12.0–15.0)
LYMPHS ABS: 2.1 10*3/uL (ref 0.7–4.0)
Lymphocytes Relative: 36.4 % (ref 12.0–46.0)
MCHC: 32.8 g/dL (ref 30.0–36.0)
MCV: 88.1 fl (ref 78.0–100.0)
Monocytes Absolute: 0.5 10*3/uL (ref 0.1–1.0)
Monocytes Relative: 7.9 % (ref 3.0–12.0)
Neutro Abs: 3.1 10*3/uL (ref 1.4–7.7)
Neutrophils Relative %: 53.2 % (ref 43.0–77.0)
Platelets: 306 10*3/uL (ref 150.0–400.0)
RBC: 4.41 Mil/uL (ref 3.87–5.11)
RDW: 14.1 % (ref 11.5–15.5)
WBC: 5.8 10*3/uL (ref 4.0–10.5)

## 2017-09-12 LAB — LIPID PANEL
Cholesterol: 190 mg/dL (ref 0–200)
HDL: 58.3 mg/dL (ref >39.00)
LDL Cholesterol: 118 mg/dL — ABNORMAL HIGH (ref 0–99)
NonHDL: 131.56
Total CHOL/HDL Ratio: 3
Triglycerides: 68 mg/dL (ref 0.0–149.0)
VLDL: 13.6 mg/dL (ref 0.0–40.0)

## 2017-09-12 LAB — HEPATIC FUNCTION PANEL
ALT: 17 U/L (ref 0–35)
AST: 16 U/L (ref 0–37)
Albumin: 4.6 g/dL (ref 3.5–5.2)
Alkaline Phosphatase: 71 U/L (ref 39–117)
BILIRUBIN DIRECT: 0.1 mg/dL (ref 0.0–0.3)
BILIRUBIN TOTAL: 1 mg/dL (ref 0.2–1.2)
Total Protein: 7.6 g/dL (ref 6.0–8.3)

## 2017-09-12 LAB — BASIC METABOLIC PANEL
BUN: 13 mg/dL (ref 6–23)
CO2: 29 mEq/L (ref 19–32)
CREATININE: 0.76 mg/dL (ref 0.40–1.20)
Calcium: 9.3 mg/dL (ref 8.4–10.5)
Chloride: 105 mEq/L (ref 96–112)
GFR: 80.47 mL/min (ref >60.00)
Glucose, Bld: 105 mg/dL — ABNORMAL HIGH (ref 70–99)
Potassium: 4.2 mEq/L (ref 3.5–5.1)
Sodium: 141 mEq/L (ref 135–145)

## 2017-09-12 LAB — HEMOGLOBIN A1C: Hgb A1c MFr Bld: 5.7 % (ref 4.6–6.5)

## 2017-09-12 LAB — TSH: TSH: 2.27 u[IU]/mL (ref 0.35–4.50)

## 2017-09-12 NOTE — Progress Notes (Addendum)
Subjective:   Theresa Francis is a 67 y.o. female who presents for an Initial Medicare Annual Wellness Visit.  Review of Systems    No ROS.  Medicare Wellness Visit. Additional risk factors are reflected in the social history.  Cardiac Risk Factors include: advanced age (>54men, >73 women)     Objective:    Today's Vitals   09/12/17 0835  BP: 122/62  Pulse: 79  Resp: 12  Temp: 98.4 F (36.9 C)  TempSrc: Oral  SpO2: 96%  Weight: 142 lb 1.9 oz (64.5 kg)  Height: 5\' 4"  (1.626 m)   Body mass index is 24.39 kg/m.  Advanced Directives 09/12/2017  Does Patient Have a Medical Advance Directive? No  Would patient like information on creating a medical advance directive? Yes (MAU/Ambulatory/Procedural Areas - Information given)    Current Medications (verified) Outpatient Encounter Medications as of 09/12/2017  Medication Sig  . acetaminophen (TYLENOL) 650 MG CR tablet Take 1,300 mg by mouth every 6 (six) hours as needed for pain.  . Calcium Carbonate-Vitamin D (CALTRATE 600+D PO) Take by mouth daily.  . Cholecalciferol (D3 SUPER STRENGTH) 2000 units CAPS Take 1 capsule by mouth daily.  . sertraline (ZOLOFT) 50 MG tablet Take 1 tablet (50 mg total) by mouth daily.   No facility-administered encounter medications on file as of 09/12/2017.     Allergies (verified) Patient has no known allergies.   History: Past Medical History:  Diagnosis Date  . Abnormal Pap smear    s/p conization.   . Basal cell carcinoma    eye lid  . History of chicken pox   . History of shingles   . Osteoarthritis   . Urine incontinence    H/O occasional   Past Surgical History:  Procedure Laterality Date  . BREAST BIOPSY Left 11/03/2014   neg./ done by Dr. Bary Castilla in office  . BREAST CYST ASPIRATION Left   . COLONOSCOPY  2008   Dr Sonny Masters  . 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 FINE NEEDLE ASPIRATION Left 2010   Dr Bary Castilla  . TONSILLECTOMY  1967   Family History    Problem Relation Age of Onset  . Breast cancer Mother 12  . Heart disease Father   . Breast cancer Sister        angiosarcoma   Social History   Socioeconomic History  . Marital status: Married    Spouse name: Not on file  . Number of children: 3  . Years of education: Not on file  . Highest education level: Not on file  Occupational History  . Not on file  Social Needs  . Financial resource strain: Not on file  . Food insecurity:    Worry: Never true    Inability: Never true  . Transportation needs:    Medical: No    Non-medical: No  Tobacco Use  . Smoking status: Never Smoker  . Smokeless tobacco: Never Used  Substance and Sexual Activity  . Alcohol use: Yes    Alcohol/week: 0.0 oz    Comment: drinks wine occasionally  . Drug use: No  . Sexual activity: Not on file  Lifestyle  . Physical activity:    Days per week: Not on file    Minutes per session: Not on file  . Stress: Not on file  Relationships  . Social connections:    Talks on phone: Not on file    Gets together: Not on file  Attends religious service: Not on file    Active member of club or organization: Not on file    Attends meetings of clubs or organizations: Not on file    Relationship status: Not on file  Other Topics Concern  . Not on file  Social History Narrative  . Not on file    Tobacco Counseling Counseling given: Not Answered   Clinical Intake:  Pre-visit preparation completed: Yes  Pain : No/denies pain     Nutritional Status: BMI of 19-24  Normal Diabetes: No  How often do you need to have someone help you when you read instructions, pamphlets, or other written materials from your doctor or pharmacy?: 1 - Never  Interpreter Needed?: No      Activities of Daily Living In your present state of health, do you have any difficulty performing the following activities: 09/12/2017  Hearing? N  Vision? N  Difficulty concentrating or making decisions? N  Walking or  climbing stairs? N  Dressing or bathing? N  Doing errands, shopping? N  Preparing Food and eating ? N  Using the Toilet? N  In the past six months, have you accidently leaked urine? N  Do you have problems with loss of bowel control? N  Managing your Medications? N  Managing your Finances? N  Housekeeping or managing your Housekeeping? N  Some recent data might be hidden     Immunizations and Health Maintenance Immunization History  Administered Date(s) Administered  . Influenza Split 11/22/2012, 11/19/2013  . Influenza Whole 11/21/2016  . Influenza, High Dose Seasonal PF 10/28/2015  . Influenza,inj,Quad PF,6+ Mos 12/03/2014  . Influenza-Unspecified 12/10/2014  . Pneumococcal Conjugate-13 12/19/2016   Health Maintenance Due  Topic Date Due  . Hepatitis C Screening  1950-09-08  . TETANUS/TDAP  10/30/1968  . COLONOSCOPY  10/31/1999    Patient Care Team: Einar Pheasant, MD as PCP - General (Internal Medicine) Einar Pheasant, MD (Internal Medicine) Bary Castilla Forest Gleason, MD (General Surgery)  Indicate any recent Medical Services you may have received from other than [facility] providers in the past year (date may be approximate).     Assessment:   This is a routine wellness examination for [facility].  The goal of the wellness visit is to assist the patient how to close the gaps in care and create a preventative care plan for the patient.   The roster of all physicians providing medical care to patient is listed in the Snapshot section of the chart.  Taking calcium VIT D as appropriate/Osteoporosis reviewed.    Safety issues reviewed; Smoke and carbon monoxide detectors in the home. No firearms in the home. Wears seatbelts when driving or riding with others. No violence in the home.  They do not have excessive sun exposure.  Discussed the need for sun protection: hats, long sleeves and the use of sunscreen if there is significant sun exposure.  Patient is alert, normal  appearance, oriented to person/place/and time. Correctly identified the president of the [place] and recalls of 2/3 words.Performs simple calculations and can read correct time from watch face. Displays appropriate judgement.  No new identified risk were noted.  No failures at ADL's or IADL's.    BMI- discussed the importance of a healthy diet, water intake and the benefits of aerobic exercise. Educational material provided.   24 hour diet recall: Low carb/low cholesterol  Dental- every 6 months.  Sleep patterns- Sleeps 6-8  hours at night.  Wakes feeling rested.   Hep C screening consent given.  Shingles discussed.   TDAP vaccine deferred per patient preference.  Follow up with insurance.  Educational material provided.  Cologuard discussed. Completed within the appropriate time frame. Health maintenance to be updated.   Patient Concerns: None at this time. Follow up with PCP as needed.  Hearing/Vision screen Hearing Screening Comments: Patient is able to hear conversational tones without difficulty.  No issues reported.   Vision Screening Comments: Followed by [facility] Wears corrective lenses Last OV 2018 Visual acuity not assessed per patient preference since they have regular follow up with the ophthalmologist  Dietary issues and exercise activities discussed: Current Exercise Habits: Home exercise routine, Type of exercise: walking;strength training/weights;yoga, Time (Minutes): 60, Frequency (Times/Week): 2, Weekly Exercise (Minutes/Week): 120  Goals    . Weight 140lb     Healthy diet Stay hydrated Exercise      Depression Screen PHQ 2/9 Scores 09/12/2017 05/15/2017 04/28/2016 02/24/2015  PHQ - 2 Score 0 1 2 0    Fall Risk Fall Risk  09/12/2017 05/15/2017 04/28/2016 10/28/2015 02/24/2015  Falls in the past year? No No No Yes Yes  Number falls in past yr: - - - 1 1  Injury with Fall? - - - No Yes  Follow up - - - Falls prevention discussed -   Cognitive  Function: MMSE - Mini Mental State Exam 09/12/2017  Orientation to time 5  Orientation to Place 5  Registration 3  Attention/ Calculation 5  Recall 3  Language- name 2 objects 2  Language- repeat 1  Language- follow 3 step command 3  Language- read & follow direction 1  Write a sentence 1  Copy design 1  Total score 30        Screening Tests Health Maintenance  Topic Date Due  . Hepatitis C Screening  1950-10-03  . TETANUS/TDAP  10/30/1968  . COLONOSCOPY  10/31/1999  . INFLUENZA VACCINE  09/21/2017  . PNA vac Low Risk Adult (2 of 2 - PPSV23) 12/19/2017  . MAMMOGRAM  09/14/2018  . DEXA SCAN  Completed     Plan:    End of life planning; Advance aging; Advanced directives discussed. Copy of current HCPOA/Living Will requested once completed.    I have personally reviewed and noted the following in the patient's chart:   . Medical and social history . Use of alcohol, tobacco or illicit drugs  . Current medications and supplements . Functional ability and status . Nutritional status . Physical activity . Advanced directives . List of other physicians . Hospitalizations, surgeries, and ER visits in previous 12 months . Vitals . Screenings to include cognitive, depression, and falls . Referrals and appointments  In addition, I have reviewed and discussed with patient certain preventive protocols, quality metrics, and best practice recommendations. A written personalized care plan for preventive services as well as general preventive health recommendations were provided to patient.     Varney Biles, LPN   1/[id number]/[id number]    Reviewed above information.  Agree with assessment and plan.    Dr Nicki Reaper

## 2017-09-12 NOTE — Patient Instructions (Addendum)
  Theresa Francis , Thank you for taking time to come for your Medicare Wellness Visit. I appreciate your ongoing commitment to your health goals. Please review the following plan we discussed and let me know if I can assist you in the future.   These are the goals we discussed: Goals    . Weight 140lb     Healthy diet Stay hydrated Exercise       This is a list of the screening recommended for you and due dates:  Health Maintenance  Topic Date Due  .  Hepatitis C: One time screening is recommended by Center for Disease Control  (CDC) for  adults born from [birth year] through 1966.   1950/03/30  . Tetanus Vaccine  10/30/1968  . Colon Cancer Screening  10/31/1999  . Flu Shot  09/21/2017  . Pneumonia vaccines (2 of 2 - PPSV23) 12/19/2017  . Mammogram  09/14/2018  . DEXA scan (bone density measurement)  Completed

## 2017-09-13 ENCOUNTER — Encounter: Payer: Self-pay | Admitting: Internal Medicine

## 2017-09-13 LAB — HEPATITIS C ANTIBODY
HEP C AB: NONREACTIVE
SIGNAL TO CUT-OFF: 0.02 (ref <1.00)

## 2017-09-14 ENCOUNTER — Ambulatory Visit
Admission: RE | Admit: 2017-09-14 | Discharge: 2017-09-14 | Disposition: A | Discharge status: Home / Self Care | Payer: Medicare Other | Source: Ambulatory Visit | Attending: Internal Medicine | Admitting: Internal Medicine

## 2017-09-14 ENCOUNTER — Encounter: Payer: Self-pay | Admitting: Internal Medicine

## 2017-09-14 ENCOUNTER — Ambulatory Visit: Payer: Medicare Other | Admitting: Internal Medicine

## 2017-09-14 VITALS — BP 132/76 | HR 73 | Temp 97.9°F | Resp 16 | Wt 147.6 lb

## 2017-09-14 DIAGNOSIS — R87619 Unspecified abnormal cytological findings in specimens from cervix uteri: Secondary | ICD-10-CM | POA: Diagnosis not present

## 2017-09-14 DIAGNOSIS — M810 Age-related osteoporosis without current pathological fracture: Secondary | ICD-10-CM

## 2017-09-14 DIAGNOSIS — T753XXA Motion sickness, initial encounter: Secondary | ICD-10-CM

## 2017-09-14 DIAGNOSIS — F439 Reaction to severe stress, unspecified: Secondary | ICD-10-CM

## 2017-09-14 DIAGNOSIS — R739 Hyperglycemia, unspecified: Secondary | ICD-10-CM | POA: Diagnosis not present

## 2017-09-14 DIAGNOSIS — E7800 Pure hypercholesterolemia, unspecified: Secondary | ICD-10-CM | POA: Diagnosis not present

## 2017-09-14 DIAGNOSIS — R928 Other abnormal and inconclusive findings on diagnostic imaging of breast: Secondary | ICD-10-CM | POA: Insufficient documentation

## 2017-09-14 MED ORDER — ONDANSETRON HCL 4 MG PO TABS: 4.0000 mg | ORAL_TABLET | Freq: Two times a day (BID) | ORAL | 0 refills | Status: DC | PRN

## 2017-09-14 NOTE — Progress Notes (Signed)
Patient ID: Theresa Francis, female   DOB: [birth year], 67 y.o.   MRN: [id number]   Subjective:    Patient ID: Theresa Francis, female    DOB: 25-Nov-1950, 67 y.o.   MRN: [id number]  HPI  Patient here for a scheduled follow up  She is s/p right and left total hip replacement.  Doing well.  Staying active.  No chest pain.  No sob.  No acid reflux.  No abdominal pain.  Bowels moving.  No urine change.  She is walking and doing yoga.  Has noticed recently experiencing some motion sickness.  [place] - [place] with direct sun light.  [place]  Took zofran.  Helped.  No headache.  No sinus pressure.  No increased cough or congestion.  Discussed cholesterol labs.     Past Medical History:  Diagnosis Date  . Abnormal Pap smear    s/p conization.   . Basal cell carcinoma    eye lid  . History of chicken pox   . History of shingles   . Osteoarthritis   . Urine incontinence    H/O occasional   Past Surgical History:  Procedure Laterality Date  . BREAST BIOPSY Left 11/03/2014   neg./ done by Dr. Bary Castilla in office  . BREAST CYST ASPIRATION Left   . COLONOSCOPY  2008   Dr Sonny Masters  . 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 FINE NEEDLE ASPIRATION Left 2010   Dr Bary Castilla  . TONSILLECTOMY  1967   Family History  Problem Relation Age of Onset  . Breast cancer Mother 28  . Heart disease Father   . Breast cancer Sister        angiosarcoma   Social History   Socioeconomic History  . Marital status: Married    Spouse name: Not on file  . Number of children: 3  . Years of education: Not on file  . Highest education level: Not on file  Occupational History  . Not on file  Social Needs  . Financial resource strain: Not on file  . Food insecurity:    Worry: Never true    Inability: Never true  . Transportation needs:    Medical: No    Non-medical: No  Tobacco Use  . Smoking status: Never Smoker  . Smokeless tobacco: Never Used    Substance and Sexual Activity  . Alcohol use: Yes    Alcohol/week: 0.0 oz    Comment: drinks wine occasionally  . Drug use: No  . Sexual activity: Not on file  Lifestyle  . Physical activity:    Days per week: Not on file    Minutes per session: Not on file  . Stress: Not on file  Relationships  . Social connections:    Talks on phone: Not on file    Gets together: Not on file    Attends religious service: Not on file    Active member of club or organization: Not on file    Attends meetings of clubs or organizations: Not on file    Relationship status: Not on file  Other Topics Concern  . Not on file  Social History Narrative  . Not on file    Outpatient Encounter Medications as of 09/14/2017  Medication Sig  . acetaminophen (TYLENOL) 650 MG CR tablet Take 1,300 mg by mouth every 6 (six) hours as needed for pain.  . Calcium Carbonate-Vitamin D (CALTRATE 600+D PO)  Take by mouth daily.  . Cholecalciferol (D3 SUPER STRENGTH) 2000 units CAPS Take 1 capsule by mouth daily.  . ondansetron (ZOFRAN) 4 MG tablet Take 1 tablet (4 mg total) by mouth 2 (two) times daily as needed for nausea or vomiting.  . sertraline (ZOLOFT) 50 MG tablet Take 1 tablet (50 mg total) by mouth daily.   No facility-administered encounter medications on file as of 09/14/2017.     Review of Systems  Constitutional: Negative for appetite change and unexpected weight change.  HENT: Negative for congestion and sinus pressure.   Respiratory: Negative for cough, chest tightness and shortness of breath.   Cardiovascular: Negative for chest pain, palpitations and leg swelling.  Gastrointestinal: Negative for abdominal pain, diarrhea, nausea and vomiting.  Genitourinary: Negative for difficulty urinating and dysuria.  Musculoskeletal: Negative for joint swelling and myalgias.  Skin: Negative for color change and rash.  Neurological: Negative for dizziness and headaches.       Motion sickness as outlined.     Psychiatric/Behavioral: Negative for agitation and dysphoric mood.       Objective:    Physical Exam  Constitutional: She appears well-developed and well-nourished. No distress.  HENT:  Nose: Nose normal.  Mouth/Throat: Oropharynx is clear and moist.  Neck: Neck supple. No thyromegaly present.  Cardiovascular: Normal rate and regular rhythm.  Pulmonary/Chest: Breath sounds normal. No respiratory distress. She has no wheezes.  Abdominal: Soft. Bowel sounds are normal. There is no tenderness.  Musculoskeletal: She exhibits no edema or tenderness.  Lymphadenopathy:    She has no cervical adenopathy.  Skin: No rash noted. No erythema.  Psychiatric: She has a normal mood and affect. Her behavior is normal.    BP 132/76 (BP Location: Left Arm, Patient Position: Sitting, Cuff Size: Normal)   Pulse 73   Temp 97.9 F (36.6 C) (Oral)   Resp 16   Wt 147 lb 9.6 oz (67 kg)   SpO2 97%   BMI 25.34 kg/m  Wt Readings from Last 3 Encounters:  09/14/17 147 lb 9.6 oz (67 kg)  09/12/17 142 lb 1.9 oz (64.5 kg)  05/15/17 145 lb (65.8 kg)     Lab Results  Component Value Date   WBC 5.8 09/12/2017   HGB 12.8 09/12/2017   HCT 38.9 09/12/2017   PLT 306.0 09/12/2017   GLUCOSE 105 (H) 09/12/2017   CHOL 190 09/12/2017   TRIG 68.0 09/12/2017   HDL 58.30 09/12/2017   LDLDIRECT 130.6 11/26/2012   LDLCALC 118 (H) 09/12/2017   ALT 17 09/12/2017   AST 16 09/12/2017   NA 141 09/12/2017   K 4.2 09/12/2017   CL 105 09/12/2017   CREATININE 0.76 09/12/2017   BUN 13 09/12/2017   CO2 29 09/12/2017   TSH 2.27 09/12/2017   HGBA1C 5.7 09/12/2017    US Breast Ltd Uni Left Inc Axilla  Result Date: 09/14/2017 CLINICAL DATA:  67 year old female for follow-up of LEFT breast masses and for annual bilateral mammograms. EXAM: DIGITAL DIAGNOSTIC BILATERAL MAMMOGRAM WITH CAD AND TOMO ULTRASOUND LEFT BREAST COMPARISON:  Previous exam(s). ACR Breast Density Category b: There are scattered areas of  fibroglandular density. FINDINGS: 2D/3D full field views of both breasts and spot compression view of the LEFT breast demonstrate no suspicious mass, nonsurgical distortion or worrisome calcifications. Mammographic images were processed with CAD. Targeted ultrasound is performed, showing 2 benign-appearing circumscribed oval hypoechoic parallel masses in the LEFT breast. These measure 4 x 2 x 4 mm at the 3 o'clock position 3  cm from the nipple and 9 x 4 x 7 mm at the 1 o'clock position 4 cm from the nipple. IMPRESSION: Benign appearing LEFT breast masses/cysts. No further imaging follow-up recommended. No mammographic evidence of breast malignancy. RECOMMENDATION: Bilateral screening mammograms in 1 year. I have discussed the findings and recommendations with the patient. Results were also provided in writing at the conclusion of the visit. If applicable, a reminder letter will be sent to the patient regarding the next appointment. BI-RADS CATEGORY  2: Benign. Electronically Signed   By: Margarette Canada M.D.   On: 09/14/2017 11:05   Mm Diag Breast Tomo Bilateral  Result Date: 09/14/2017 CLINICAL DATA:  67 year old female for follow-up of LEFT breast masses and for annual bilateral mammograms. EXAM: DIGITAL DIAGNOSTIC BILATERAL MAMMOGRAM WITH CAD AND TOMO ULTRASOUND LEFT BREAST COMPARISON:  Previous exam(s). ACR Breast Density Category b: There are scattered areas of fibroglandular density. FINDINGS: 2D/3D full field views of both breasts and spot compression view of the LEFT breast demonstrate no suspicious mass, nonsurgical distortion or worrisome calcifications. Mammographic images were processed with CAD. Targeted ultrasound is performed, showing 2 benign-appearing circumscribed oval hypoechoic parallel masses in the LEFT breast. These measure 4 x 2 x 4 mm at the 3 o'clock position 3 cm from the nipple and 9 x 4 x 7 mm at the 1 o'clock position 4 cm from the nipple. IMPRESSION: Benign appearing LEFT breast  masses/cysts. No further imaging follow-up recommended. No mammographic evidence of breast malignancy. RECOMMENDATION: Bilateral screening mammograms in 1 year. I have discussed the findings and recommendations with the patient. Results were also provided in writing at the conclusion of the visit. If applicable, a reminder letter will be sent to the patient regarding the next appointment. BI-RADS CATEGORY  2: Benign. Electronically Signed   By: Margarette Canada M.D.   On: 09/14/2017 11:05       Assessment & Plan:   Problem List Items Addressed This Visit    Abnormal Pap smear of cervix    PAP 04/29/16 - negative with negative HPV.       Hypercholesterolemia    Low cholesterol diet and exercise.  Follow lipid panel.       Relevant Orders   CBC with Differential/Platelet   Hepatic function panel   Lipid panel   TSH   Basic metabolic panel   Motion sickness    Had two episodes of "motion sickness" as outlined.  Took zofran.  Resolved.  Discussed further w/up and evaluation.  She declines.  zofran refilled.  Follow.        Osteoporosis    Received reclast.  Has been seeing Dr Jefm Bryant.        Relevant Orders   VITAMIN D 25 Hydroxy (Vit-D Deficiency, Fractures)   Stress    On zoloft.  Doing well on this regimen.  Follow.         Other Visit Diagnoses    Hyperglycemia    -  Primary   Relevant Orders   Hemoglobin A1c       Einar Pheasant, MD

## 2017-09-16 DIAGNOSIS — T753XXA Motion sickness, initial encounter: Secondary | ICD-10-CM | POA: Insufficient documentation

## 2017-09-16 NOTE — Assessment & Plan Note (Signed)
Received reclast.  Has been seeing Dr Jefm Bryant.

## 2017-09-16 NOTE — Assessment & Plan Note (Signed)
Had two episodes of "motion sickness" as outlined.  Took zofran.  Resolved.  Discussed further w/up and evaluation.  She declines.  zofran refilled.  Follow.

## 2017-09-16 NOTE — Assessment & Plan Note (Signed)
On zoloft.  Doing well on this regimen.  Follow.

## 2017-09-16 NOTE — Assessment & Plan Note (Signed)
PAP 04/29/16 - negative with negative HPV.

## 2017-09-16 NOTE — Assessment & Plan Note (Signed)
Low cholesterol diet and exercise.  Follow lipid panel.   

## 2018-01-08 ENCOUNTER — Other Ambulatory Visit: Payer: Medicare Other

## 2018-01-09 ENCOUNTER — Other Ambulatory Visit (INDEPENDENT_AMBULATORY_CARE_PROVIDER_SITE_OTHER): Payer: Medicare Other

## 2018-01-09 DIAGNOSIS — R739 Hyperglycemia, unspecified: Secondary | ICD-10-CM | POA: Diagnosis not present

## 2018-01-09 DIAGNOSIS — E7800 Pure hypercholesterolemia, unspecified: Secondary | ICD-10-CM

## 2018-01-09 DIAGNOSIS — M810 Age-related osteoporosis without current pathological fracture: Secondary | ICD-10-CM

## 2018-01-09 LAB — CBC WITH DIFFERENTIAL/PLATELET
BASOS ABS: 0 10*3/uL (ref 0.0–0.1)
Basophils Relative: 0.7 % (ref 0.0–3.0)
EOS PCT: 2.1 % (ref 0.0–5.0)
Eosinophils Absolute: 0.1 10*3/uL (ref 0.0–0.7)
HEMATOCRIT: 38.6 % (ref 36.0–46.0)
HEMOGLOBIN: 12.7 g/dL (ref 12.0–15.0)
Lymphocytes Relative: 37.2 % (ref 12.0–46.0)
Lymphs Abs: 2.1 10*3/uL (ref 0.7–4.0)
MCHC: 33 g/dL (ref 30.0–36.0)
MCV: 87 fl (ref 78.0–100.0)
MONOS PCT: 7.9 % (ref 3.0–12.0)
Monocytes Absolute: 0.4 10*3/uL (ref 0.1–1.0)
NEUTROS PCT: 52.1 % (ref 43.0–77.0)
Neutro Abs: 2.9 10*3/uL (ref 1.4–7.7)
Platelets: 304 10*3/uL (ref 150.0–400.0)
RBC: 4.44 Mil/uL (ref 3.87–5.11)
RDW: 13.6 % (ref 11.5–15.5)
WBC: 5.5 10*3/uL (ref 4.0–10.5)

## 2018-01-09 LAB — BASIC METABOLIC PANEL
BUN: 16 mg/dL (ref 6–23)
CHLORIDE: 105 meq/L (ref 96–112)
CO2: 28 meq/L (ref 19–32)
CREATININE: 0.76 mg/dL (ref 0.40–1.20)
Calcium: 9.3 mg/dL (ref 8.4–10.5)
GFR: 80.39 mL/min (ref >60.00)
Glucose, Bld: 102 mg/dL — ABNORMAL HIGH (ref 70–99)
POTASSIUM: 4 meq/L (ref 3.5–5.1)
Sodium: 141 mEq/L (ref 135–145)

## 2018-01-09 LAB — LIPID PANEL
CHOL/HDL RATIO: 4
Cholesterol: 174 mg/dL (ref 0–200)
HDL: 45.9 mg/dL (ref >39.00)
LDL Cholesterol: 113 mg/dL — ABNORMAL HIGH (ref 0–99)
NONHDL: 128.2
TRIGLYCERIDES: 75 mg/dL (ref 0.0–149.0)
VLDL: 15 mg/dL (ref 0.0–40.0)

## 2018-01-09 LAB — HEPATIC FUNCTION PANEL
ALBUMIN: 4.4 g/dL (ref 3.5–5.2)
ALT: 22 U/L (ref 0–35)
AST: 19 U/L (ref 0–37)
Alkaline Phosphatase: 81 U/L (ref 39–117)
Bilirubin, Direct: 0.1 mg/dL (ref 0.0–0.3)
Total Bilirubin: 0.9 mg/dL (ref 0.2–1.2)
Total Protein: 7.2 g/dL (ref 6.0–8.3)

## 2018-01-09 LAB — TSH: TSH: 1.56 u[IU]/mL (ref 0.35–4.50)

## 2018-01-09 LAB — VITAMIN D 25 HYDROXY (VIT D DEFICIENCY, FRACTURES): VITD: 31.22 ng/mL (ref 30.00–100.00)

## 2018-01-09 LAB — HEMOGLOBIN A1C: HEMOGLOBIN A1C: 5.8 % (ref 4.6–6.5)

## 2018-01-11 ENCOUNTER — Ambulatory Visit: Payer: Medicare Other | Admitting: Internal Medicine

## 2018-01-11 ENCOUNTER — Ambulatory Visit (INDEPENDENT_AMBULATORY_CARE_PROVIDER_SITE_OTHER): Payer: Medicare Other

## 2018-01-11 ENCOUNTER — Encounter: Payer: Self-pay | Admitting: Internal Medicine

## 2018-01-11 VITALS — BP 108/60 | HR 73 | Temp 98.0°F | Resp 18 | Wt 146.6 lb

## 2018-01-11 DIAGNOSIS — M79674 Pain in right toe(s): Secondary | ICD-10-CM | POA: Diagnosis not present

## 2018-01-11 DIAGNOSIS — M79672 Pain in left foot: Secondary | ICD-10-CM

## 2018-01-11 DIAGNOSIS — M810 Age-related osteoporosis without current pathological fracture: Secondary | ICD-10-CM

## 2018-01-11 DIAGNOSIS — R945 Abnormal results of liver function studies: Secondary | ICD-10-CM

## 2018-01-11 DIAGNOSIS — R42 Dizziness and giddiness: Secondary | ICD-10-CM

## 2018-01-11 DIAGNOSIS — E7800 Pure hypercholesterolemia, unspecified: Secondary | ICD-10-CM

## 2018-01-11 DIAGNOSIS — R928 Other abnormal and inconclusive findings on diagnostic imaging of breast: Secondary | ICD-10-CM

## 2018-01-11 DIAGNOSIS — M79675 Pain in left toe(s): Secondary | ICD-10-CM | POA: Diagnosis not present

## 2018-01-11 DIAGNOSIS — R739 Hyperglycemia, unspecified: Secondary | ICD-10-CM

## 2018-01-11 DIAGNOSIS — F439 Reaction to severe stress, unspecified: Secondary | ICD-10-CM

## 2018-01-11 DIAGNOSIS — R7989 Other specified abnormal findings of blood chemistry: Secondary | ICD-10-CM

## 2018-01-11 NOTE — Progress Notes (Signed)
Patient ID: MICKENZIE STOLAR, female   DOB: 12-Jan-1951, 67 y.o.   MRN: [id number]   Subjective:    Patient ID: [name], female    DOB: 1950-08-18, 67 y.o.   MRN: [id number]  HPI  Patient here for a scheduled follow up.  Previous dizziness better.  Not a significant issue for her now. Stays active.  No chest pain.  No sob.  No acid reflux.  No abdominal pain.  Bowels moving.  No urine change.  Hit her toe/foot yesterday.  Increased swelling and bruising.  Handling stress.     Past Medical History:  Diagnosis Date  . Abnormal Pap smear    s/p conization.   . Basal cell carcinoma    eye lid  . History of chicken pox   . History of shingles   . Osteoarthritis   . Urine incontinence    H/O occasional   Past Surgical History:  Procedure Laterality Date  . BREAST BIOPSY Left 11/03/2014   neg./ done by Dr. Bary Castilla in office  . BREAST CYST ASPIRATION Left   . COLONOSCOPY  2008   Dr Sonny Masters  . 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 FINE NEEDLE ASPIRATION Left 2010   Dr Bary Castilla  . TONSILLECTOMY  1967   Family History  Problem Relation Age of Onset  . Breast cancer Mother 54  . Heart disease Father   . Breast cancer Sister        angiosarcoma   Social History   Socioeconomic History  . Marital status: Married    Spouse name: Not on file  . Number of children: 3  . Years of education: Not on file  . Highest education level: Not on file  Occupational History  . Not on file  Social Needs  . Financial resource strain: Not on file  . Food insecurity:    Worry: Never true    Inability: Never true  . Transportation needs:    Medical: No    Non-medical: No  Tobacco Use  . Smoking status: Never Smoker  . Smokeless tobacco: Never Used  Substance and Sexual Activity  . Alcohol use: Yes    Alcohol/week: 0.0 standard drinks    Comment: drinks wine occasionally  . Drug use: No  . Sexual activity: Not on file  Lifestyle  . Physical activity:    Days  per week: Not on file    Minutes per session: Not on file  . Stress: Not on file  Relationships  . Social connections:    Talks on phone: Not on file    Gets together: Not on file    Attends religious service: Not on file    Active member of club or organization: Not on file    Attends meetings of clubs or organizations: Not on file    Relationship status: Not on file  Other Topics Concern  . Not on file  Social History Narrative  . Not on file    Outpatient Encounter Medications as of 01/11/2018  Medication Sig  . acetaminophen (TYLENOL) 650 MG CR tablet Take 1,300 mg by mouth every 6 (six) hours as needed for pain.  . Calcium Carbonate-Vitamin D (CALTRATE 600+D PO) Take by mouth daily.  . Cholecalciferol (D3 SUPER STRENGTH) 2000 units CAPS Take 1 capsule by mouth daily.  . ondansetron (ZOFRAN) 4 MG tablet Take 1 tablet (4 mg total) by mouth 2 (two) times daily as needed for nausea or vomiting.  Marland Kitchen  sertraline (ZOLOFT) 50 MG tablet Take 1 tablet (50 mg total) by mouth daily.   No facility-administered encounter medications on file as of 01/11/2018.     Review of Systems  Constitutional: Negative for appetite change and unexpected weight change.  HENT: Negative for congestion and sinus pressure.   Respiratory: Negative for cough, chest tightness and shortness of breath.   Cardiovascular: Negative for chest pain, palpitations and leg swelling.  Gastrointestinal: Negative for abdominal pain, diarrhea, nausea and vomiting.  Genitourinary: Negative for difficulty urinating and dysuria.  Musculoskeletal: Negative for joint swelling and myalgias.       Toe pain and swelling as outlined.    Skin: Negative for color change and rash.  Neurological: Negative for headaches.       Dizziness - better.    Psychiatric/Behavioral: Negative for agitation and dysphoric mood.       Objective:    Physical Exam  Constitutional: She appears well-developed and well-nourished. No distress.    HENT:  Nose: Nose normal.  Mouth/Throat: Oropharynx is clear and moist.  Neck: Neck supple. No thyromegaly present.  Cardiovascular: Normal rate and regular rhythm.  Pulmonary/Chest: Breath sounds normal. No respiratory distress. She has no wheezes.  Abdominal: Soft. Bowel sounds are normal. There is no tenderness.  Musculoskeletal: She exhibits no edema or tenderness.  Increased soft tissue swelling - left toe.  Bruising. Increased pain to palpation.    Lymphadenopathy:    She has no cervical adenopathy.  Skin: No rash noted. No erythema.  Psychiatric: She has a normal mood and affect. Her behavior is normal.    BP 108/60 (BP Location: Left Arm, Patient Position: Sitting, Cuff Size: Normal)   Pulse 73   Temp 98 F (36.7 C) (Oral)   Resp 18   Wt 146 lb 9.6 oz (66.5 kg)   SpO2 96%   BMI 25.16 kg/m  Wt Readings from Last 3 Encounters:  01/11/18 146 lb 9.6 oz (66.5 kg)  09/14/17 147 lb 9.6 oz (67 kg)  09/12/17 142 lb 1.9 oz (64.5 kg)     Lab Results  Component Value Date   WBC 5.5 01/09/2018   HGB 12.7 01/09/2018   HCT 38.6 01/09/2018   PLT 304.0 01/09/2018   GLUCOSE 102 (H) 01/09/2018   CHOL 174 01/09/2018   TRIG 75.0 01/09/2018   HDL 45.90 01/09/2018   LDLDIRECT 130.6 11/26/2012   LDLCALC 113 (H) 01/09/2018   ALT 22 01/09/2018   AST 19 01/09/2018   NA 141 01/09/2018   K 4.0 01/09/2018   CL 105 01/09/2018   CREATININE 0.76 01/09/2018   BUN 16 01/09/2018   CO2 28 01/09/2018   TSH 1.56 01/09/2018   HGBA1C 5.8 01/09/2018    US Breast Ltd Uni Left Inc Axilla  Result Date: 09/14/2017 CLINICAL DATA:  67 year old female for follow-up of LEFT breast masses and for annual bilateral mammograms. EXAM: DIGITAL DIAGNOSTIC BILATERAL MAMMOGRAM WITH CAD AND TOMO ULTRASOUND LEFT BREAST COMPARISON:  Previous exam(s). ACR Breast Density Category b: There are scattered areas of fibroglandular density. FINDINGS: 2D/3D full field views of both breasts and spot compression view of  the LEFT breast demonstrate no suspicious mass, nonsurgical distortion or worrisome calcifications. Mammographic images were processed with CAD. Targeted ultrasound is performed, showing 2 benign-appearing circumscribed oval hypoechoic parallel masses in the LEFT breast. These measure 4 x 2 x 4 mm at the 3 o'clock position 3 cm from the nipple and 9 x 4 x 7 mm at the 1 o'clock position  4 cm from the nipple. IMPRESSION: Benign appearing LEFT breast masses/cysts. No further imaging follow-up recommended. No mammographic evidence of breast malignancy. RECOMMENDATION: Bilateral screening mammograms in 1 year. I have discussed the findings and recommendations with the patient. Results were also provided in writing at the conclusion of the visit. If applicable, a reminder letter will be sent to the patient regarding the next appointment. BI-RADS CATEGORY  2: Benign. Electronically Signed   By: Margarette Canada M.D.   On: 09/14/2017 11:05   Mm Diag Breast Tomo Bilateral  Result Date: 09/14/2017 CLINICAL DATA:  66 year old female for follow-up of LEFT breast masses and for annual bilateral mammograms. EXAM: DIGITAL DIAGNOSTIC BILATERAL MAMMOGRAM WITH CAD AND TOMO ULTRASOUND LEFT BREAST COMPARISON:  Previous exam(s). ACR Breast Density Category b: There are scattered areas of fibroglandular density. FINDINGS: 2D/3D full field views of both breasts and spot compression view of the LEFT breast demonstrate no suspicious mass, nonsurgical distortion or worrisome calcifications. Mammographic images were processed with CAD. Targeted ultrasound is performed, showing 2 benign-appearing circumscribed oval hypoechoic parallel masses in the LEFT breast. These measure 4 x 2 x 4 mm at the 3 o'clock position 3 cm from the nipple and 9 x 4 x 7 mm at the 1 o'clock position 4 cm from the nipple. IMPRESSION: Benign appearing LEFT breast masses/cysts. No further imaging follow-up recommended. No mammographic evidence of breast malignancy.  RECOMMENDATION: Bilateral screening mammograms in 1 year. I have discussed the findings and recommendations with the patient. Results were also provided in writing at the conclusion of the visit. If applicable, a reminder letter will be sent to the patient regarding the next appointment. BI-RADS CATEGORY  2: Benign. Electronically Signed   By: Margarette [place] M.D.   On: 09/14/2017 11:05       Assessment & Plan:   Problem List Items Addressed This Visit    Abnormal liver function tests    Follow liver panel.        Abnormal mammogram    Mammogram 09/14/17 - Birads II.  Recommended f/u in one year.        Dizziness    Better.  Not a significant issue for her now.  Follow.      Hypercholesterolemia    Low cholesterol diet and exercise.  Follow lipid panel.        Relevant Orders   Hepatic function panel   Lipid panel   Basic metabolic panel   Osteoporosis    Received reclast.  Followed by Dr Jefm Bryant.        Stress    On zoloft.  Stable.  Follow.         Other Visit Diagnoses    Left foot pain    -  Primary   s/p injury.  check xray to confirm no fracture.     Relevant Orders   DG Foot 2 Views Left (Completed)   Pain of toe of left foot       s/p injury.  check xray to confirm no fracture.     Relevant Orders   DG Foot 2 Views Left (Completed)   Hyperglycemia       Relevant Orders   Hemoglobin A1c       Einar Pheasant, MD

## 2018-01-14 ENCOUNTER — Encounter: Payer: Self-pay | Admitting: Internal Medicine

## 2018-01-14 DIAGNOSIS — R42 Dizziness and giddiness: Secondary | ICD-10-CM | POA: Insufficient documentation

## 2018-01-14 NOTE — Assessment & Plan Note (Signed)
On zoloft.  Stable.  Follow.  

## 2018-01-14 NOTE — Assessment & Plan Note (Signed)
Low cholesterol diet and exercise.  Follow lipid panel.   

## 2018-01-14 NOTE — Assessment & Plan Note (Signed)
Received reclast.  Followed by Dr Jefm Bryant.

## 2018-01-14 NOTE — Assessment & Plan Note (Signed)
Follow liver panel.  

## 2018-01-14 NOTE — Assessment & Plan Note (Signed)
Mammogram 09/14/17 - Birads II.  Recommended f/u in one year.

## 2018-01-14 NOTE — Assessment & Plan Note (Signed)
Better.  Not a significant issue for her now.  Follow.

## 2018-05-10 ENCOUNTER — Other Ambulatory Visit: Payer: Medicare Other

## 2018-05-15 ENCOUNTER — Ambulatory Visit: Payer: Medicare Other | Admitting: Internal Medicine

## 2018-07-05 ENCOUNTER — Encounter: Payer: Self-pay | Admitting: *Deleted

## 2018-09-12 ENCOUNTER — Other Ambulatory Visit: Payer: Self-pay

## 2018-09-12 ENCOUNTER — Other Ambulatory Visit (INDEPENDENT_AMBULATORY_CARE_PROVIDER_SITE_OTHER): Payer: Medicare Other

## 2018-09-12 DIAGNOSIS — R739 Hyperglycemia, unspecified: Secondary | ICD-10-CM

## 2018-09-12 DIAGNOSIS — E7800 Pure hypercholesterolemia, unspecified: Secondary | ICD-10-CM | POA: Diagnosis not present

## 2018-09-12 LAB — BASIC METABOLIC PANEL
BUN: 12 mg/dL (ref 6–23)
CO2: 29 mEq/L (ref 19–32)
Calcium: 10.4 mg/dL (ref 8.4–10.5)
Chloride: 104 mEq/L (ref 96–112)
Creatinine, Ser: 0.76 mg/dL (ref 0.40–1.20)
GFR: 75.49 mL/min (ref >60.00)
Glucose, Bld: 102 mg/dL — ABNORMAL HIGH (ref 70–99)
Potassium: 4.1 mEq/L (ref 3.5–5.1)
Sodium: 140 mEq/L (ref 135–145)

## 2018-09-12 LAB — LIPID PANEL
Cholesterol: 200 mg/dL (ref 0–200)
HDL: 55.6 mg/dL (ref >39.00)
LDL Cholesterol: 127 mg/dL — ABNORMAL HIGH (ref 0–99)
NonHDL: 143.93
Total CHOL/HDL Ratio: 4
Triglycerides: 84 mg/dL (ref 0.0–149.0)
VLDL: 16.8 mg/dL (ref 0.0–40.0)

## 2018-09-12 LAB — HEPATIC FUNCTION PANEL
ALT: 19 U/L (ref 0–35)
AST: 19 U/L (ref 0–37)
Albumin: 4.9 g/dL (ref 3.5–5.2)
Alkaline Phosphatase: 74 U/L (ref 39–117)
Bilirubin, Direct: 0.2 mg/dL (ref 0.0–0.3)
Total Bilirubin: 1.6 mg/dL — ABNORMAL HIGH (ref 0.2–1.2)
Total Protein: 7.2 g/dL (ref 6.0–8.3)

## 2018-09-12 LAB — HEMOGLOBIN A1C: Hgb A1c MFr Bld: 5.8 % (ref 4.6–6.5)

## 2018-09-14 ENCOUNTER — Other Ambulatory Visit: Payer: Self-pay

## 2018-09-14 ENCOUNTER — Ambulatory Visit: Payer: Medicare Other | Admitting: Internal Medicine

## 2018-09-14 ENCOUNTER — Ambulatory Visit (INDEPENDENT_AMBULATORY_CARE_PROVIDER_SITE_OTHER): Payer: Medicare Other | Admitting: Internal Medicine

## 2018-09-14 ENCOUNTER — Encounter: Payer: Self-pay | Admitting: Internal Medicine

## 2018-09-14 ENCOUNTER — Ambulatory Visit (INDEPENDENT_AMBULATORY_CARE_PROVIDER_SITE_OTHER): Payer: Medicare Other

## 2018-09-14 VITALS — Ht 64.0 in | Wt 147.0 lb

## 2018-09-14 DIAGNOSIS — R945 Abnormal results of liver function studies: Secondary | ICD-10-CM | POA: Diagnosis not present

## 2018-09-14 DIAGNOSIS — M810 Age-related osteoporosis without current pathological fracture: Secondary | ICD-10-CM | POA: Diagnosis not present

## 2018-09-14 DIAGNOSIS — R7989 Other specified abnormal findings of blood chemistry: Secondary | ICD-10-CM

## 2018-09-14 DIAGNOSIS — Z0000 Encounter for general adult medical examination without abnormal findings: Secondary | ICD-10-CM

## 2018-09-14 DIAGNOSIS — E7800 Pure hypercholesterolemia, unspecified: Secondary | ICD-10-CM | POA: Diagnosis not present

## 2018-09-14 DIAGNOSIS — Z1231 Encounter for screening mammogram for malignant neoplasm of breast: Secondary | ICD-10-CM

## 2018-09-14 NOTE — Progress Notes (Signed)
Subjective:   Theresa Francis is a 68 y.o. female who presents for Medicare Annual (Subsequent) preventive examination.  Review of Systems:  No ROS.  Medicare Wellness Virtual Visit.  Visual/audio telehealth visit, UTA vital signs.   See social history for additional risk factors.   Cardiac Risk Factors include: advanced age (>74men, >80 women)     Objective:     Vitals: There were no vitals taken for this visit.  There is no height or weight on file to calculate BMI.  Advanced Directives 09/14/2018 09/12/2017  Does Patient Have a Medical Advance Directive? Yes No  Type of Paramedic of [place];Living will -  Does patient want to make changes to medical advance directive? No - Patient declined -  Copy of [place] in Chart? No - copy requested -  Would patient like information on creating a medical advance directive? - Yes (MAU/Ambulatory/Procedural Areas - Information given)    Tobacco Social History   Tobacco Use  Smoking Status Never Smoker  Smokeless Tobacco Never Used     Counseling given: Not Answered   Clinical Intake:  Pre-visit preparation completed: Yes        Diabetes: No  How often do you need to have someone help you when you read instructions, pamphlets, or other written materials from your doctor or pharmacy?: 1 - Never  Interpreter Needed?: No     Past Medical History:  Diagnosis Date  . Abnormal Pap smear    s/p conization.   . Basal cell carcinoma    eye lid  . History of chicken pox   . History of shingles   . Osteoarthritis   . Urine incontinence    H/O occasional   Past Surgical History:  Procedure Laterality Date  . BREAST BIOPSY Left 11/03/2014   neg./ done by Dr. Bary Castilla in office  . BREAST CYST ASPIRATION Left   . COLONOSCOPY  2008   Dr Sonny Masters  . 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 FINE NEEDLE ASPIRATION Left 2010   Dr Bary Castilla  . TONSILLECTOMY  1967    Family History  Problem Relation Age of Onset  . Breast cancer Mother 73  . Heart disease Father   . Breast cancer Sister        angiosarcoma   Social History   Socioeconomic History  . Marital status: Married    Spouse name: Not on file  . Number of children: 3  . Years of education: Not on file  . Highest education level: Not on file  Occupational History  . Not on file  Social Needs  . Financial resource strain: Not hard at all  . Food insecurity    Worry: Never true    Inability: Never true  . Transportation needs    Medical: No    Non-medical: No  Tobacco Use  . Smoking status: Never Smoker  . Smokeless tobacco: Never Used  Substance and Sexual Activity  . Alcohol use: Yes    Alcohol/week: 0.0 standard drinks    Comment: drinks wine occasionally  . Drug use: No  . Sexual activity: Not on file  Lifestyle  . Physical activity    Days per week: 3 days    Minutes per session: 60 min  . Stress: Not at all  Relationships  . Social Herbalist on phone: Not on file    Gets together: Not on file  Attends religious service: Not on file    Active member of club or organization: Not on file    Attends meetings of clubs or organizations: Not on file    Relationship status: Not on file  Other Topics Concern  . Not on file  Social History Narrative  . Not on file    Outpatient Encounter Medications as of 09/14/2018  Medication Sig  . acetaminophen (TYLENOL) 650 MG CR tablet Take 1,300 mg by mouth every 6 (six) hours as needed for pain.  . Calcium Carbonate-Vitamin D (CALTRATE 600+D PO) Take by mouth daily.  . Cholecalciferol (D3 SUPER STRENGTH) 2000 units CAPS Take 1 capsule by mouth daily.  . ondansetron (ZOFRAN) 4 MG tablet Take 1 tablet (4 mg total) by mouth 2 (two) times daily as needed for nausea or vomiting.  . sertraline (ZOLOFT) 50 MG tablet Take 1 tablet (50 mg total) by mouth daily. (Patient not taking: Reported on 09/14/2018)   No  facility-administered encounter medications on file as of 09/14/2018.     Activities of Daily Living In your present state of health, do you have any difficulty performing the following activities: 09/14/2018  Hearing? N  Vision? N  Difficulty concentrating or making decisions? N  Walking or climbing stairs? N  Dressing or bathing? N  Doing errands, shopping? N  Preparing Food and eating ? N  Using the Toilet? N  In the past six months, have you accidently leaked urine? Y  Comment Managed with daily liner  Do you have problems with loss of bowel control? N  Managing your Medications? N  Managing your Finances? N  Housekeeping or managing your Housekeeping? Y  Comment Maid assist as needed  Some recent data might be hidden    Patient Care Team: Einar Pheasant, MD as PCP - General (Internal Medicine) Einar Pheasant, MD (Internal Medicine) Bary Castilla Forest Gleason, MD (General Surgery)    Assessment:   This is a routine wellness examination for [facility].  I connected with patient 09/14/18 at  8:30 AM EDT by a video/audio enabled telemedicine application and verified that I am speaking with the correct person using two identifiers. Patient stated full name and DOB. Patient gave permission to continue with virtual visit. Patient's location was at home and Nurse's location was at [place] office.   Health Screenings  Mammogram - 08/2017 Cologuard-04/2016 Bone Density - 02/2017 Glaucoma -none Hearing -demonstrates normal hearing during visit. Hepatitis C Screening- 08/2017 Cholesterol - 08/2018 Hemoglobin A1C- 08/2018 (5.8) Dental- visits every 6 months Vision- visits within the last 12 months.  Social  Alcohol intake - yes      Smoking history- never  Smokers in home? none Illicit drug use? none Exercise - walking 2-3 miles every other day Diet - healthy Sexually Active -not currently BMI- discussed the importance of a healthy diet, water intake and the benefits of aerobic  exercise.  Educational material provided.   Safety  Patient feels safe at home- yes Patient does have smoke detectors at home- yes Patient does wear sunscreen or protective clothing when in direct sunlight -yes Patient does wear seat belt when in a moving vehicle -yes Patient drives- yes  [id number] precautions and sickness symptoms discussed.   Activities of Daily Living Patient denies needing assistance with: driving, household chores, feeding themselves, getting from bed to chair, getting to the toilet, bathing/showering, dressing, managing money, or preparing meals.  No new identified risk were noted.    Depression Screen Patient denies losing interest in daily  life, feeling hopeless, or crying easily over simple problems.   Medication-Zoloft- no longer taking. She feels much better since she has been able to exercise. Taking all other scheduled medications as directed and without issues.   Fall Screen Patient denies being afraid of falling or falling in the last year.   Memory Screen Patient is alert.  Patient denies difficulty focusing, concentrating or misplacing items. Correctly identified the president of the [place], season and recall. Patient likes to read for brain stimulation.  Immunizations The following Immunizations were discussed: Influenza, shingles, pneumonia, and tetanus.   Other Providers Patient Care Team: Einar Pheasant, MD as PCP - General (Internal Medicine) Einar Pheasant, MD (Internal Medicine) Bary Castilla Forest Gleason, MD (General Surgery)  Exercise Activities and Dietary recommendations Current Exercise Habits: Home exercise routine, Type of exercise: yoga;walking, Time (Minutes): 60, Frequency (Times/Week): 3, Weekly Exercise (Minutes/Week): 180, Intensity: Mild  Goals      Patient Stated   . DIET - REDUCE PORTION SIZE (pt-stated)     Lose about 7-8 lbs        Fall Risk Fall Risk  09/14/2018 09/12/2017 05/15/2017 04/28/2016 10/28/2015  Falls in the  past year? 0 No No No Yes  Number falls in past yr: - - - - 1  Injury with Fall? - - - - No  Follow up - - - - Falls prevention discussed   Is the patient's home free of loose throw rugs in walkways, pet beds, electrical cords, etc? yes      Grab bars in the bathroom? yes      Handrails on the stairs? yes      Adequate lighting? yes  Depression Screen PHQ 2/9 Scores 09/14/2018 09/12/2017 05/15/2017 04/28/2016  PHQ - 2 Score 0 0 1 2     Cognitive Function MMSE - Mini Mental State Exam 09/12/2017  Orientation to time 5  Orientation to Place 5  Registration 3  Attention/ Calculation 5  Recall 3  Language- name 2 objects 2  Language- repeat 1  Language- follow 3 step command 3  Language- read & follow direction 1  Write a sentence 1  Copy design 1  Total score 30     6CIT Screen 09/14/2018  What Year? 0 points  What month? 0 points  What time? 0 points  Count back from 20 0 points  Months in reverse 0 points  Repeat phrase 0 points  Total Score 0    Immunization History  Administered Date(s) Administered  . Influenza Split 11/22/2012, 11/19/2013  . Influenza Whole 11/21/2016  . Influenza, High Dose Seasonal PF 10/28/2015, 11/19/2016  . Influenza,inj,Quad PF,6+ Mos 12/03/2014, 12/06/2017  . Influenza-Unspecified 12/10/2014, 12/06/2017  . Pneumococcal Conjugate-13 12/19/2016  . Pneumococcal Polysaccharide-23 11/23/2017  . Zoster Recombinat (Shingrix) 10/18/2017, 01/05/2018   Screening Tests Health Maintenance  Topic Date Due  . TETANUS/TDAP  10/30/1968  . INFLUENZA VACCINE  09/22/2018  . Fecal DNA (Cologuard)  05/13/2019  . MAMMOGRAM  09/15/2019  . DEXA SCAN  Completed  . Hepatitis C Screening  Completed  . PNA vac Low Risk Adult  Completed      Plan:    End of life planning; Advance aging; Advanced directives discussed.  Copy of current HCPOA/Living Will requested.    I have personally reviewed and noted the following in the patient's chart:   . Medical and  social history . Use of alcohol, tobacco or illicit drugs  . Current medications and supplements . Functional ability and status . Nutritional  status . Physical activity . Advanced directives . List of other physicians . Hospitalizations, surgeries, and ER visits in previous 12 months . Vitals . Screenings to include cognitive, depression, and falls . Referrals and appointments  In addition, I have reviewed and discussed with patient certain preventive protocols, quality metrics, and best practice recommendations. A written personalized care plan for preventive services as well as general preventive health recommendations were provided to patient.     Varney Biles, LPN  [id number]/[id number]   Reviewed above information.  Agree with assessment and plan.    Dr Nicki Reaper

## 2018-09-14 NOTE — Progress Notes (Signed)
Patient ID: Theresa Francis, female   DOB: 1950-09-25, 68 y.o.   MRN: [id number]   Virtual Visit via video Note  This visit type was conducted due to national recommendations for restrictions regarding the COVID-19 pandemic (e.g. social distancing).  This format is felt to be most appropriate for this patient at this time.  All issues noted in this document were discussed and addressed.  No physical exam was performed (except for noted visual exam findings with Video Visits).   I connected with Theresa Francis by a video enabled telemedicine application and verified that I am speaking with the correct person using two identifiers. Location patient: home Location provider: work Persons participating in the virtual visit: patient, provider  I discussed the limitations, risks, security and privacy concerns of performing an evaluation and management service by video and the availability of in person appointments. The patient expressed understanding and agreed to proceed.   Reason for visit: scheduled follow up.    HPI: She reports she is doing well.  Feels good.  Trying to stay active.  Dizziness resolved.  No headache.  No chest pain.  No sob.  No acid reflux. No abdominal pain.  Bowels moving.  Discussed cholesterol results.  Discussed calculated cholesterol risk.  Discussed starting cholesterol medication.  She declines.  Off zoloft. Feels she is doing well off the medication.  Is exercising.  Hip doing well.    ROS: See pertinent positives and negatives per HPI.  Past Medical History:  Diagnosis Date  . Abnormal Pap smear    s/p conization.   . Basal cell carcinoma    eye lid  . History of chicken pox   . History of shingles   . Osteoarthritis   . Urine incontinence    H/O occasional    Past Surgical History:  Procedure Laterality Date  . BREAST BIOPSY Left 11/03/2014   neg./ done by Dr. Bary Castilla in office  . BREAST CYST ASPIRATION Left   . COLONOSCOPY  2008   Dr Sonny Masters   . 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 FINE NEEDLE ASPIRATION Left 2010   Dr Bary Castilla  . TONSILLECTOMY  1967    Family History  Problem Relation Age of Onset  . Breast cancer Mother 27  . Heart disease Father   . Breast cancer Sister        angiosarcoma    SOCIAL HX: reviewed.    Current Outpatient Medications:  .  acetaminophen (TYLENOL) 650 MG CR tablet, Take 1,300 mg by mouth every 6 (six) hours as needed for pain., Disp: , Rfl:  .  Calcium Carbonate-Vitamin D (CALTRATE 600+D PO), Take by mouth daily., Disp: , Rfl:  .  Cholecalciferol (D3 SUPER STRENGTH) 2000 units CAPS, Take 1 capsule by mouth daily., Disp: , Rfl:  .  ondansetron (ZOFRAN) 4 MG tablet, Take 1 tablet (4 mg total) by mouth 2 (two) times daily as needed for nausea or vomiting., Disp: 15 tablet, Rfl: 0  EXAM:  GENERAL: alert, oriented, appears well and in no acute distress  HEENT: atraumatic, conjunttiva clear, no obvious abnormalities on inspection of external nose and ears  NECK: normal movements of the head and neck  LUNGS: on inspection no signs of respiratory distress, breathing rate appears normal, no obvious gross SOB, gasping or wheezing  CV: no obvious cyanosis  PSYCH/NEURO: pleasant and cooperative, no obvious depression or anxiety, speech and thought processing grossly intact  ASSESSMENT AND PLAN:  Discussed the  following assessment and plan:  Abnormal liver function tests Slight elevated bilirubin.  Remainder of liver function tests are wnl.  Follow liver panel.    Hyperbilirubinemia Bilirubin slightly elevated.  Recheck liver panel.    Hypercholesterolemia Low cholesterol diet and exercise.  Follow lipid panel.  Discussed calculated cholesterol risk. Discussed starting cholesterol medication.  She declines.  Follow lipid panel.    Osteoporosis Followed by Dr Jefm Bryant.  Reclast.      I discussed the assessment and treatment plan with the patient. The patient was provided  an opportunity to ask questions and all were answered. The patient agreed with the plan and demonstrated an understanding of the instructions.   The patient was advised to call back or seek an in-person evaluation if the symptoms worsen or if the condition fails to improve as anticipated.   Einar Pheasant, MD

## 2018-09-14 NOTE — Patient Instructions (Addendum)
  Theresa Francis , Thank you for taking time to come for your Medicare Wellness Visit. I appreciate your ongoing commitment to your health goals. Please review the following plan we discussed and let me know if I can assist you in the future.   These are the goals we discussed: Goals      Patient Stated   . DIET - REDUCE PORTION SIZE (pt-stated)     Lose about 7-8 lbs        This is a list of the screening recommended for you and due dates:  Health Maintenance  Topic Date Due  . Tetanus Vaccine  10/30/1968  . Flu Shot  09/22/2018  . Cologuard (Stool DNA test)  05/13/2019  . Mammogram  09/15/2019  . DEXA scan (bone density measurement)  Completed  .  Hepatitis C: One time screening is recommended by Center for Disease Control  (CDC) for  adults born from [birth year] through 1966.   Completed  . Pneumonia vaccines  Completed

## 2018-09-16 ENCOUNTER — Encounter: Payer: Self-pay | Admitting: Internal Medicine

## 2018-09-16 NOTE — Assessment & Plan Note (Signed)
Bilirubin slightly elevated.  Recheck liver panel.

## 2018-09-16 NOTE — Assessment & Plan Note (Signed)
Followed by Dr Jefm Bryant.  Reclast.

## 2018-09-16 NOTE — Assessment & Plan Note (Signed)
Slight elevated bilirubin.  Remainder of liver function tests are wnl.  Follow liver panel.

## 2018-09-16 NOTE — Assessment & Plan Note (Signed)
Low cholesterol diet and exercise.  Follow lipid panel.  Discussed calculated cholesterol risk. Discussed starting cholesterol medication.  She declines.  Follow lipid panel.

## 2018-09-19 ENCOUNTER — Encounter: Payer: Self-pay | Admitting: Internal Medicine

## 2018-10-16 ENCOUNTER — Other Ambulatory Visit (INDEPENDENT_AMBULATORY_CARE_PROVIDER_SITE_OTHER): Payer: Medicare Other

## 2018-10-16 ENCOUNTER — Other Ambulatory Visit: Payer: Self-pay

## 2018-10-16 LAB — HEPATIC FUNCTION PANEL
ALT: 14 U/L (ref 0–35)
AST: 17 U/L (ref 0–37)
Albumin: 4.9 g/dL (ref 3.5–5.2)
Alkaline Phosphatase: 77 U/L (ref 39–117)
Bilirubin, Direct: 0.2 mg/dL (ref 0.0–0.3)
Total Bilirubin: 1.4 mg/dL — ABNORMAL HIGH (ref 0.2–1.2)
Total Protein: 7.2 g/dL (ref 6.0–8.3)

## 2018-10-21 ENCOUNTER — Other Ambulatory Visit: Payer: Self-pay | Admitting: Internal Medicine

## 2018-10-21 DIAGNOSIS — R945 Abnormal results of liver function studies: Secondary | ICD-10-CM

## 2018-10-21 DIAGNOSIS — R7989 Other specified abnormal findings of blood chemistry: Secondary | ICD-10-CM

## 2018-10-21 NOTE — Progress Notes (Signed)
Order placed for abdominal ultrasound.   

## 2018-11-02 ENCOUNTER — Ambulatory Visit
Admission: RE | Admit: 2018-11-02 | Discharge: 2018-11-02 | Disposition: A | Discharge status: Home / Self Care | Payer: Medicare Other | Source: Ambulatory Visit | Attending: Internal Medicine | Admitting: Internal Medicine

## 2018-11-02 DIAGNOSIS — Z1231 Encounter for screening mammogram for malignant neoplasm of breast: Secondary | ICD-10-CM | POA: Insufficient documentation

## 2018-11-04 ENCOUNTER — Other Ambulatory Visit: Payer: Self-pay | Admitting: Internal Medicine

## 2018-11-04 DIAGNOSIS — R928 Other abnormal and inconclusive findings on diagnostic imaging of breast: Secondary | ICD-10-CM

## 2018-11-04 NOTE — Progress Notes (Signed)
Orders placed for f/u left breast mammogram and ultrasound 

## 2018-11-05 ENCOUNTER — Ambulatory Visit
Admission: RE | Admit: 2018-11-05 | Discharge: 2018-11-05 | Disposition: A | Discharge status: Home / Self Care | Payer: Medicare Other | Source: Ambulatory Visit | Attending: Internal Medicine | Admitting: Internal Medicine

## 2018-11-05 ENCOUNTER — Other Ambulatory Visit: Payer: Self-pay

## 2018-11-05 DIAGNOSIS — R945 Abnormal results of liver function studies: Secondary | ICD-10-CM | POA: Diagnosis present

## 2018-11-05 DIAGNOSIS — R7989 Other specified abnormal findings of blood chemistry: Secondary | ICD-10-CM

## 2018-11-11 ENCOUNTER — Encounter: Payer: Self-pay | Admitting: Internal Medicine

## 2018-11-12 ENCOUNTER — Other Ambulatory Visit: Payer: Self-pay | Admitting: Internal Medicine

## 2018-11-12 ENCOUNTER — Ambulatory Visit
Admission: RE | Admit: 2018-11-12 | Discharge: 2018-11-12 | Disposition: A | Discharge status: Home / Self Care | Payer: Medicare Other | Source: Ambulatory Visit | Attending: Internal Medicine | Admitting: Internal Medicine

## 2018-11-12 DIAGNOSIS — R7989 Other specified abnormal findings of blood chemistry: Secondary | ICD-10-CM

## 2018-11-12 DIAGNOSIS — R928 Other abnormal and inconclusive findings on diagnostic imaging of breast: Secondary | ICD-10-CM | POA: Insufficient documentation

## 2018-11-12 DIAGNOSIS — R935 Abnormal findings on diagnostic imaging of other abdominal regions, including retroperitoneum: Secondary | ICD-10-CM

## 2018-11-12 DIAGNOSIS — R945 Abnormal results of liver function studies: Secondary | ICD-10-CM

## 2018-11-12 NOTE — Progress Notes (Signed)
Order placed for GI referral.   

## 2018-11-13 ENCOUNTER — Encounter: Payer: Self-pay | Admitting: Internal Medicine

## 2018-11-20 DIAGNOSIS — L255 Unspecified contact dermatitis due to plants, except food: Secondary | ICD-10-CM | POA: Insufficient documentation

## 2018-11-20 DIAGNOSIS — Z6824 Body mass index (BMI) 24.0-24.9, adult: Secondary | ICD-10-CM | POA: Insufficient documentation

## 2018-11-20 DIAGNOSIS — R21 Rash and other nonspecific skin eruption: Secondary | ICD-10-CM | POA: Insufficient documentation

## 2018-12-03 ENCOUNTER — Other Ambulatory Visit: Payer: Self-pay

## 2018-12-03 DIAGNOSIS — Z20822 Contact with and (suspected) exposure to covid-19: Secondary | ICD-10-CM

## 2018-12-04 LAB — NOVEL CORONAVIRUS, NAA: SARS-CoV-2, NAA: NOT DETECTED

## 2018-12-19 ENCOUNTER — Ambulatory Visit (INDEPENDENT_AMBULATORY_CARE_PROVIDER_SITE_OTHER): Payer: Medicare Other | Admitting: Gastroenterology

## 2018-12-19 ENCOUNTER — Other Ambulatory Visit: Payer: Self-pay

## 2018-12-19 ENCOUNTER — Encounter: Payer: Self-pay | Admitting: Gastroenterology

## 2018-12-19 DIAGNOSIS — K760 Fatty (change of) liver, not elsewhere classified: Secondary | ICD-10-CM | POA: Diagnosis not present

## 2018-12-19 NOTE — Progress Notes (Signed)
Gastroenterology Consultation  Referring Provider:     Einar Pheasant, MD Primary Care Physician:  Theresa Pheasant, MD Primary Gastroenterologist:  Dr. Allen Francis     Reason for Consultation:     Elevated bilirubin        HPI:   Theresa Francis is a 68 y.o. y/o female referred for consultation & management of elevated bilirubin by Dr. Nicki Francis, Theresa Patient, MD.  This Francis comes in today with a long history of abnormal bilirubin dating back to 2015.  Every time the Francis's bilirubin has been checked the direct bilirubin has been normal with the elevation being from indirect bilirubin.  The Francis did have an ultrasound that showed some fatty liver.  The Francis's AST and ALT have remained normal this entire time.  The Francis denies any alcohol abuse or abdominal pain.  She also denies any history of unexplained weight loss fevers chills nausea or vomiting.  The Francis's last colonoscopy was done many years ago and she reports that she recently had a Cologuard test done that was negative. The Francis denies any history of diabetes or hypercholesterolemia.  The Francis's BMI is 26.  Past Medical History:  Diagnosis Date  . Abnormal Pap smear    s/p conization.   . Basal cell carcinoma    eye lid  . History of chicken pox   . History of shingles   . Osteoarthritis   . Urine incontinence    H/O occasional    Past Surgical History:  Procedure Laterality Date  . BREAST BIOPSY Left 11/03/2014   neg./ done by Dr. Bary Francis in office  . BREAST CYST ASPIRATION Left   . COLONOSCOPY  2008   Dr Theresa Francis  . 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 FINE NEEDLE ASPIRATION Left 2010   Dr Theresa Francis  . TONSILLECTOMY  1967    Prior to Admission medications   Medication Sig Start Date End Date Taking? Authorizing Provider  acetaminophen (TYLENOL) 650 MG CR tablet Take 1,300 mg by mouth every 6 (six) hours as needed for pain.   Yes [provider]  Calcium Carbonate-Vitamin D  (CALTRATE 600+D PO) Take by mouth daily.   Yes [provider]  Cholecalciferol (D3 SUPER STRENGTH) 2000 units CAPS Take 1 capsule by mouth daily.   Yes [provider]  ondansetron (ZOFRAN) 4 MG tablet Take 1 tablet (4 mg total) by mouth 2 (two) times daily as needed for nausea or vomiting. 09/14/17  Yes Theresa Pheasant, MD    Family History  Problem Relation Age of Onset  . Breast cancer Mother 26  . Heart disease Father   . Breast cancer Sister        angiosarcoma     Social History   Tobacco Use  . Smoking status: Never Smoker  . Smokeless tobacco: Never Used  Substance Use Topics  . Alcohol use: Yes    Alcohol/week: 0.0 standard drinks    Comment: drinks wine occasionally  . Drug use: No    Allergies as of 12/19/2018  . (No Known Allergies)    Review of Systems:    All systems reviewed and negative except where noted in HPI.   Physical Exam:  BP (!) 156/78   Pulse 79   Temp 97.7 F (36.5 C) (Temporal)   Ht 5\' 4"  (1.626 m)   Wt 151 lb 12.8 oz (68.9 kg)   BMI 26.06 kg/m  No LMP recorded. Francis is postmenopausal. General:  Alert,  Well-developed, well-nourished, pleasant and cooperative in NAD Head:  Normocephalic and atraumatic. Eyes:  Sclera clear, no icterus.   Conjunctiva pink. Ears:  Normal auditory acuity. Nose:  No deformity, discharge, or lesions. Lungs:  Respirations even and unlabored.  Clear throughout to auscultation.   No wheezes, crackles, or rhonchi. No acute distress. Heart:  Regular rate and rhythm; no murmurs, clicks, rubs, or gallops. Abdomen:  Normal bowel sounds.  No bruits.  Soft, non-tender and non-distended without masses, hepatosplenomegaly or hernias noted.  No guarding or rebound tenderness.  Negative Carnett sign.   Rectal:  Deferred.  Msk:  Symmetrical without gross deformities.  Good, equal movement & strength bilaterally. Pulses:  Normal pulses noted. Extremities:  No clubbing or edema.  No cyanosis.  Neurologic:  Alert and oriented x3;  grossly normal neurologically. Skin:  Intact without significant lesions or rashes.  No jaundice. Lymph Nodes:  No significant cervical adenopathy. Psych:  Alert and cooperative. Normal mood and affect.  Imaging Studies: No results found.  Assessment and Plan:   Theresa Francis is a 68 y.o. y/o female who comes in today with a chronically elevated unconjugated bilirubin.  This is consistent with Gilbert's syndrome and since there is no elevation in the direct bilirubin unlikely related to the liver.  The Francis's AST and ALT have also remained normal therefore her fatty liver is not causing any hepatitis at this time.  The Francis has been told the diagnosis and that since there is not significant modifying factors such as treating cholesterol diabetes or obesity it is unlikely to have the fatty liver regressed.  She has also been told that since it is not causing any inflammation it is likely benign in her circumstances.  I have also told her that no further work-up needs to be done for her unconjugated hyperbilirubinemia.  The Francis has been explained the plan and agrees with it.   This visit consisted of 30 minutes face to face contact with myself and at least 50% of this time was spent in counseling and education regarding diagnosis, treatment options, medication management, risks and benefits of treatment.  Theresa Lame, MD. Theresa Francis    Note: This dictation was prepared with Dragon dictation along with smaller phrase technology. Any transcriptional errors that result from this process are unintentional.

## 2019-01-11 ENCOUNTER — Other Ambulatory Visit: Payer: Self-pay

## 2019-01-11 DIAGNOSIS — Z20822 Contact with and (suspected) exposure to covid-19: Secondary | ICD-10-CM

## 2019-01-13 LAB — NOVEL CORONAVIRUS, NAA: SARS-CoV-2, NAA: NOT DETECTED

## 2019-03-13 ENCOUNTER — Telehealth: Payer: Self-pay | Admitting: *Deleted

## 2019-03-13 DIAGNOSIS — E7800 Pure hypercholesterolemia, unspecified: Secondary | ICD-10-CM

## 2019-03-13 DIAGNOSIS — R739 Hyperglycemia, unspecified: Secondary | ICD-10-CM

## 2019-03-13 NOTE — Telephone Encounter (Signed)
Please place future orders for lab appt.  

## 2019-03-13 NOTE — Telephone Encounter (Signed)
Orders placed for f/u labs.  

## 2019-03-15 ENCOUNTER — Other Ambulatory Visit (INDEPENDENT_AMBULATORY_CARE_PROVIDER_SITE_OTHER): Payer: Medicare PPO

## 2019-03-15 ENCOUNTER — Other Ambulatory Visit: Payer: Self-pay

## 2019-03-15 DIAGNOSIS — R739 Hyperglycemia, unspecified: Secondary | ICD-10-CM

## 2019-03-15 DIAGNOSIS — E7800 Pure hypercholesterolemia, unspecified: Secondary | ICD-10-CM | POA: Diagnosis not present

## 2019-03-15 LAB — BASIC METABOLIC PANEL
BUN: 12 mg/dL (ref 6–23)
CO2: 27 mEq/L (ref 19–32)
Calcium: 9.3 mg/dL (ref 8.4–10.5)
Chloride: 106 mEq/L (ref 96–112)
Creatinine, Ser: 0.73 mg/dL (ref 0.40–1.20)
GFR: 78.96 mL/min (ref >60.00)
Glucose, Bld: 101 mg/dL — ABNORMAL HIGH (ref 70–99)
Potassium: 3.9 mEq/L (ref 3.5–5.1)
Sodium: 142 mEq/L (ref 135–145)

## 2019-03-15 LAB — CBC WITH DIFFERENTIAL/PLATELET
Basophils Absolute: 0 10*3/uL (ref 0.0–0.1)
Basophils Relative: 0.7 % (ref 0.0–3.0)
Eosinophils Absolute: 0.1 10*3/uL (ref 0.0–0.7)
Eosinophils Relative: 1.4 % (ref 0.0–5.0)
HCT: 41.4 % (ref 36.0–46.0)
Hemoglobin: 13.7 g/dL (ref 12.0–15.0)
Lymphocytes Relative: 37.5 % (ref 12.0–46.0)
Lymphs Abs: 2.2 10*3/uL (ref 0.7–4.0)
MCHC: 33.1 g/dL (ref 30.0–36.0)
MCV: 87.8 fl (ref 78.0–100.0)
Monocytes Absolute: 0.4 10*3/uL (ref 0.1–1.0)
Monocytes Relative: 7.6 % (ref 3.0–12.0)
Neutro Abs: 3 10*3/uL (ref 1.4–7.7)
Neutrophils Relative %: 52.8 % (ref 43.0–77.0)
Platelets: 285 10*3/uL (ref 150.0–400.0)
RBC: 4.72 Mil/uL (ref 3.87–5.11)
RDW: 13.8 % (ref 11.5–15.5)
WBC: 5.7 10*3/uL (ref 4.0–10.5)

## 2019-03-15 LAB — LIPID PANEL
Cholesterol: 189 mg/dL (ref 0–200)
HDL: 54.6 mg/dL (ref >39.00)
LDL Cholesterol: 120 mg/dL — ABNORMAL HIGH (ref 0–99)
NonHDL: 134.72
Total CHOL/HDL Ratio: 3
Triglycerides: 72 mg/dL (ref 0.0–149.0)
VLDL: 14.4 mg/dL (ref 0.0–40.0)

## 2019-03-15 LAB — HEPATIC FUNCTION PANEL
ALT: 13 U/L (ref 0–35)
AST: 16 U/L (ref 0–37)
Albumin: 4.6 g/dL (ref 3.5–5.2)
Alkaline Phosphatase: 78 U/L (ref 39–117)
Bilirubin, Direct: 0.2 mg/dL (ref 0.0–0.3)
Total Bilirubin: 1.6 mg/dL — ABNORMAL HIGH (ref 0.2–1.2)
Total Protein: 7.2 g/dL (ref 6.0–8.3)

## 2019-03-15 LAB — HEMOGLOBIN A1C: Hgb A1c MFr Bld: 5.7 % (ref 4.6–6.5)

## 2019-03-15 LAB — TSH: TSH: 1.82 u[IU]/mL (ref 0.35–4.50)

## 2019-03-18 ENCOUNTER — Encounter: Payer: Medicare Other | Admitting: Internal Medicine

## 2019-04-17 ENCOUNTER — Other Ambulatory Visit: Payer: Self-pay

## 2019-04-17 ENCOUNTER — Ambulatory Visit (INDEPENDENT_AMBULATORY_CARE_PROVIDER_SITE_OTHER): Payer: Medicare PPO | Admitting: Internal Medicine

## 2019-04-17 VITALS — BP 140/78 | HR 88 | Temp 96.7°F | Resp 16 | Ht 64.0 in | Wt 153.8 lb

## 2019-04-17 DIAGNOSIS — E2839 Other primary ovarian failure: Secondary | ICD-10-CM

## 2019-04-17 DIAGNOSIS — E7800 Pure hypercholesterolemia, unspecified: Secondary | ICD-10-CM

## 2019-04-17 DIAGNOSIS — Z0000 Encounter for general adult medical examination without abnormal findings: Secondary | ICD-10-CM

## 2019-04-17 DIAGNOSIS — R7989 Other specified abnormal findings of blood chemistry: Secondary | ICD-10-CM

## 2019-04-17 DIAGNOSIS — R945 Abnormal results of liver function studies: Secondary | ICD-10-CM

## 2019-04-17 DIAGNOSIS — M810 Age-related osteoporosis without current pathological fracture: Secondary | ICD-10-CM | POA: Diagnosis not present

## 2019-04-17 DIAGNOSIS — K219 Gastro-esophageal reflux disease without esophagitis: Secondary | ICD-10-CM | POA: Diagnosis not present

## 2019-04-17 MED ORDER — ROSUVASTATIN CALCIUM 5 MG PO TABS: ORAL_TABLET | ORAL | 1 refills | Status: DC

## 2019-04-17 NOTE — Progress Notes (Addendum)
Patient ID: SHARRION GALYAN, female   DOB: 06/06/1950, 69 y.o.   MRN: [id number]   Subjective:    Patient ID: [name], female    DOB: 09-07-1950, 69 y.o.   MRN: [id number]  HPI This visit occurred during the SARS-CoV-2 public health emergency.  Safety protocols were in place, including screening questions prior to the visit, additional usage of staff PPE, and extensive cleaning of exam room while observing appropriate contact time as indicated for disinfecting solutions.  Patient here for her physical exam.  She reports she is doing relatively well.  Trying to stay active.  No chest pain or sob reported.  Does report acid reflux.  No abdominal pain or bowel change reported.  Discussed labs.  Discussed calculated cholesterol risk and recommendation to start cholesterol medication.  Agreed to start crestor.  Had negative cologuard 04/2016.  Recently saw GI - Dr Allen Norris - to confirm if any further w/up warranted for fatty liver.  No further w/up warranted at this time.  Discussed low cholesterol diet, weight loss and treating cholesterol.    Past Medical History:  Diagnosis Date  . Abnormal Pap smear    s/p conization.   . Basal cell carcinoma    eye lid  . History of chicken pox   . History of shingles   . Osteoarthritis   . Urine incontinence    H/O occasional   Past Surgical History:  Procedure Laterality Date  . BREAST BIOPSY Left 11/03/2014   neg./ done by Dr. Bary Castilla in office  . BREAST CYST ASPIRATION Left   . COLONOSCOPY  2008   Dr Sonny Masters  . 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 FINE NEEDLE ASPIRATION Left 2010   Dr Bary Castilla  . TONSILLECTOMY  1967   Family History  Problem Relation Age of Onset  . Breast cancer Mother 67  . Heart disease Father   . Breast cancer Sister        angiosarcoma   Social History   Socioeconomic History  . Marital status: Married    Spouse name: Not on file  . Number of children: 3  . Years of education: Not on file    . Highest education level: Not on file  Occupational History  . Not on file  Tobacco Use  . Smoking status: Never Smoker  . Smokeless tobacco: Never Used  Substance and Sexual Activity  . Alcohol use: Yes    Alcohol/week: 0.0 standard drinks    Comment: drinks wine occasionally  . Drug use: No  . Sexual activity: Not on file  Other Topics Concern  . Not on file  Social History Narrative  . Not on file   Social Determinants of Health   Financial Resource Strain: Low Risk   . Difficulty of Paying Living Expenses: Not hard at all  Food Insecurity:   . Worried About Charity fundraiser in the Last Year: Not on file  . Ran Out of Food in the Last Year: Not on file  Transportation Needs:   . Lack of Transportation (Medical): Not on file  . Lack of Transportation (Non-Medical): Not on file  Physical Activity: Sufficiently Active  . Days of Exercise per Week: 3 days  . Minutes of Exercise per Session: 60 min  Stress: No Stress Concern Present  . Feeling of Stress : Not at all  Social Connections:   . Frequency of Communication with Friends and Family: Not on file  .  Frequency of Social Gatherings with Friends and Family: Not on file  . Attends Religious Services: Not on file  . Active Member of Clubs or Organizations: Not on file  . Attends Archivist Meetings: Not on file  . Marital Status: Not on file    Outpatient Encounter Medications as of 04/17/2019  Medication Sig  . acetaminophen (TYLENOL) 650 MG CR tablet Take 1,300 mg by mouth every 6 (six) hours as needed for pain.  . Calcium Carbonate-Vitamin D (CALTRATE 600+D PO) Take by mouth daily.  . Cholecalciferol (D3 SUPER STRENGTH) 2000 units CAPS Take 1 capsule by mouth daily.  . ondansetron (ZOFRAN) 4 MG tablet Take 1 tablet (4 mg total) by mouth 2 (two) times daily as needed for nausea or vomiting.  . rosuvastatin (CRESTOR) 5 MG tablet Take one tablet q Monday, Wednesday and Friday.   No facility-administered  encounter medications on file as of 04/17/2019.   Review of Systems  Constitutional: Negative for appetite change and unexpected weight change.  HENT: Negative for congestion and sinus pressure.   Eyes: Negative for pain and visual disturbance.  Respiratory: Negative for cough, chest tightness and shortness of breath.   Cardiovascular: Negative for chest pain, palpitations and leg swelling.  Gastrointestinal: Negative for abdominal pain, diarrhea, nausea and vomiting.  Genitourinary: Negative for difficulty urinating and dysuria.  Musculoskeletal: Negative for joint swelling and myalgias.  Skin: Negative for color change and rash.  Neurological: Negative for dizziness, light-headedness and headaches.  Hematological: Negative for adenopathy. Does not bruise/bleed easily.  Psychiatric/Behavioral: Negative for agitation and dysphoric mood.       Objective:    Physical Exam Constitutional:      General: She is not in acute distress.    Appearance: Normal appearance. She is well-developed.  HENT:     Head: Normocephalic and atraumatic.     Right Ear: External ear normal.     Left Ear: External ear normal.  Eyes:     General: No scleral icterus.       Right eye: No discharge.        Left eye: No discharge.     Conjunctiva/sclera: Conjunctivae normal.  Neck:     Thyroid: No thyromegaly.  Cardiovascular:     Rate and Rhythm: Normal rate and regular rhythm.  Pulmonary:     Effort: No tachypnea, accessory muscle usage or respiratory distress.     Breath sounds: Normal breath sounds. No decreased breath sounds or wheezing.  Chest:     Breasts:        Right: No inverted nipple, mass, nipple discharge or tenderness (no axillary adenopathy).        Left: No inverted nipple, mass, nipple discharge or tenderness (no axilarry adenopathy).  Abdominal:     General: Bowel sounds are normal.     Palpations: Abdomen is soft.     Tenderness: There is no abdominal tenderness.  Musculoskeletal:         General: No swelling or tenderness.     Cervical back: Neck supple. No tenderness.  Lymphadenopathy:     Cervical: No cervical adenopathy.  Skin:    Findings: No erythema or rash.  Neurological:     Mental Status: She is alert and oriented to person, place, and time.  Psychiatric:        Mood and Affect: Mood normal.        Behavior: Behavior normal.     BP 140/78   Pulse 88   Temp Marland Kitchen)  96.7 F (35.9 C)   Resp 16   Ht 5\' 4"  (1.626 m)   Wt 153 lb 12.8 oz (69.8 kg)   SpO2 98%   BMI 26.40 kg/m  Wt Readings from Last 3 Encounters:  04/17/19 153 lb 12.8 oz (69.8 kg)  12/19/18 151 lb 12.8 oz (68.9 kg)  09/14/18 147 lb (66.7 kg)     Lab Results  Component Value Date   WBC 5.7 03/15/2019   HGB 13.7 03/15/2019   HCT 41.4 03/15/2019   PLT 285.0 03/15/2019   GLUCOSE 101 (H) 03/15/2019   CHOL 189 03/15/2019   TRIG 72.0 03/15/2019   HDL 54.60 03/15/2019   LDLDIRECT 130.6 11/26/2012   LDLCALC 120 (H) 03/15/2019   ALT 13 03/15/2019   AST 16 03/15/2019   NA 142 03/15/2019   K 3.9 03/15/2019   CL 106 03/15/2019   CREATININE 0.73 03/15/2019   BUN 12 03/15/2019   CO2 27 03/15/2019   TSH 1.82 03/15/2019   HGBA1C 5.7 03/15/2019    US BREAST LTD UNI LEFT INC AXILLA  Result Date: 11/12/2018 CLINICAL DATA:  Recall from screening to evaluate a possible left breast mass. EXAM: DIGITAL DIAGNOSTIC left MAMMOGRAM WITH TOMO ULTRASOUND left BREAST COMPARISON:  Previous exam(s). ACR Breast Density Category b: There are scattered areas of fibroglandular density. FINDINGS: There is persistence of an oval circumscribed 4 mm mass over the upper outer quadrant of the left breast. Remainder of the left breast is unchanged. Targeted ultrasound is performed, showing an oval simple cyst over the 3 o'clock position of the left breast 6 cm from the nipple corresponding to the mammographic finding. This measures 4 x 4 x 5 mm. IMPRESSION: 5 mm simple cyst over the 3 o'clock position of the left  breast accounting for the mammographic finding. RECOMMENDATION: Recommend continued annual bilateral screening mammographic follow-up. I have discussed the findings and recommendations with the patient. If applicable, a reminder letter will be sent to the patient regarding the next appointment. BI-RADS CATEGORY  2: Benign. Electronically Signed   By: Marin Olp M.D.   On: 11/12/2018 12:09   MM DIAG BREAST TOMO UNI LEFT  Result Date: 11/12/2018 CLINICAL DATA:  Recall from screening to evaluate a possible left breast mass. EXAM: DIGITAL DIAGNOSTIC left MAMMOGRAM WITH TOMO ULTRASOUND left BREAST COMPARISON:  Previous exam(s). ACR Breast Density Category b: There are scattered areas of fibroglandular density. FINDINGS: There is persistence of an oval circumscribed 4 mm mass over the upper outer quadrant of the left breast. Remainder of the left breast is unchanged. Targeted ultrasound is performed, showing an oval simple cyst over the 3 o'clock position of the left breast 6 cm from the nipple corresponding to the mammographic finding. This measures 4 x 4 x 5 mm. IMPRESSION: 5 mm simple cyst over the 3 o'clock position of the left breast accounting for the mammographic finding. RECOMMENDATION: Recommend continued annual bilateral screening mammographic follow-up. I have discussed the findings and recommendations with the patient. If applicable, a reminder letter will be sent to the patient regarding the next appointment. BI-RADS CATEGORY  2: Benign. Electronically Signed   By: Marin Olp M.D.   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  Ultrasound as outlined. Saw GI.  No further w/up warranted.  Follow liver function tests.  GERD (gastroesophageal reflux disease)    Pepcid daily.  Follow.  Call with update to confirm symptoms resolved.        Health care  maintenance    Physical today 04/17/19.  PAP 05/01/16 - negative with negative HPV (low cellularity).   cologuard 04/2016 - negative.  Discussed colonoscopy.  Notify when ready to proceed with colonoscopy.  Mammogram 11/02/18 - - Birads 0.  F/u left breast mammogram and ultrasound 11/12/18 - Birads II.        Hypercholesterolemia    Discussed recent cholesterol labs and calculated cholesterol risk.  Agreeable to start crestor.  Will need liver panel checked 6 weeks after starting crestor.  Low cholesterol diet and exercise.  Follow lipid panel and liver function tests.        Relevant Medications   rosuvastatin (CRESTOR) 5 MG tablet   Other Relevant Orders   Hepatic function panel   Hepatic function panel   Osteoporosis    Schedule f/u bone bone density. Received reclast previously.        Other Visit Diagnoses    Estrogen deficiency    -  Primary   Relevant Orders   DG Bone Density       Einar Pheasant, MD

## 2019-04-19 DIAGNOSIS — L718 Other rosacea: Secondary | ICD-10-CM | POA: Diagnosis not present

## 2019-04-19 DIAGNOSIS — L821 Other seborrheic keratosis: Secondary | ICD-10-CM | POA: Diagnosis not present

## 2019-04-19 DIAGNOSIS — D2261 Melanocytic nevi of right upper limb, including shoulder: Secondary | ICD-10-CM | POA: Diagnosis not present

## 2019-04-19 DIAGNOSIS — D225 Melanocytic nevi of trunk: Secondary | ICD-10-CM | POA: Diagnosis not present

## 2019-04-19 DIAGNOSIS — Z85828 Personal history of other malignant neoplasm of skin: Secondary | ICD-10-CM | POA: Diagnosis not present

## 2019-04-19 DIAGNOSIS — D2262 Melanocytic nevi of left upper limb, including shoulder: Secondary | ICD-10-CM | POA: Diagnosis not present

## 2019-04-21 ENCOUNTER — Encounter: Payer: Self-pay | Admitting: Internal Medicine

## 2019-04-21 DIAGNOSIS — K219 Gastro-esophageal reflux disease without esophagitis: Secondary | ICD-10-CM | POA: Insufficient documentation

## 2019-04-21 NOTE — Assessment & Plan Note (Signed)
Schedule f/u bone bone density. Received reclast previously.  

## 2019-04-21 NOTE — Assessment & Plan Note (Signed)
Physical today 04/17/19.  PAP 05/01/16 - negative with negative HPV (low cellularity).   cologuard 04/2016 - negative.  Discussed colonoscopy.  Notify when ready to proceed with colonoscopy.  Mammogram 11/02/18 - - Birads 0.  F/u left breast mammogram and ultrasound 11/12/18 - Birads II.

## 2019-04-21 NOTE — Assessment & Plan Note (Addendum)
Pepcid daily.  Follow.  Call with update to confirm symptoms resolved.

## 2019-04-21 NOTE — Assessment & Plan Note (Signed)
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  Ultrasound as outlined. Saw GI.  No further w/up warranted.  Follow liver function tests.

## 2019-04-21 NOTE — Assessment & Plan Note (Signed)
Discussed recent cholesterol labs and calculated cholesterol risk.  Agreeable to start crestor.  Will need liver panel checked 6 weeks after starting crestor.  Low cholesterol diet and exercise.  Follow lipid panel and liver function tests.

## 2019-05-21 ENCOUNTER — Encounter: Payer: Self-pay | Admitting: Internal Medicine

## 2019-05-29 ENCOUNTER — Ambulatory Visit
Admission: RE | Admit: 2019-05-29 | Discharge: 2019-05-29 | Disposition: A | Discharge status: Home / Self Care | Payer: Medicare PPO | Source: Ambulatory Visit | Attending: Internal Medicine | Admitting: Internal Medicine

## 2019-05-29 ENCOUNTER — Other Ambulatory Visit: Payer: Medicare PPO

## 2019-05-29 DIAGNOSIS — M8589 Other specified disorders of bone density and structure, multiple sites: Secondary | ICD-10-CM | POA: Diagnosis not present

## 2019-05-29 DIAGNOSIS — E2839 Other primary ovarian failure: Secondary | ICD-10-CM | POA: Insufficient documentation

## 2019-05-29 DIAGNOSIS — Z780 Asymptomatic menopausal state: Secondary | ICD-10-CM | POA: Diagnosis not present

## 2019-05-30 ENCOUNTER — Telehealth: Payer: Self-pay | Admitting: Internal Medicine

## 2019-05-30 NOTE — Telephone Encounter (Signed)
Pt was calling about bone density test

## 2019-05-30 NOTE — Telephone Encounter (Signed)
See result note.  

## 2019-05-31 ENCOUNTER — Other Ambulatory Visit: Payer: Self-pay

## 2019-05-31 ENCOUNTER — Other Ambulatory Visit (INDEPENDENT_AMBULATORY_CARE_PROVIDER_SITE_OTHER): Payer: Medicare PPO

## 2019-05-31 DIAGNOSIS — E7800 Pure hypercholesterolemia, unspecified: Secondary | ICD-10-CM

## 2019-05-31 LAB — HEPATIC FUNCTION PANEL
ALT: 15 U/L (ref 0–35)
AST: 18 U/L (ref 0–37)
Albumin: 4.7 g/dL (ref 3.5–5.2)
Alkaline Phosphatase: 77 U/L (ref 39–117)
Bilirubin, Direct: 0.2 mg/dL (ref 0.0–0.3)
Total Bilirubin: 1.6 mg/dL — ABNORMAL HIGH (ref 0.2–1.2)
Total Protein: 7.2 g/dL (ref 6.0–8.3)

## 2019-06-03 ENCOUNTER — Encounter: Payer: Self-pay | Admitting: Internal Medicine

## 2019-07-09 DIAGNOSIS — H52223 Regular astigmatism, bilateral: Secondary | ICD-10-CM | POA: Diagnosis not present

## 2019-07-09 DIAGNOSIS — H5203 Hypermetropia, bilateral: Secondary | ICD-10-CM | POA: Diagnosis not present

## 2019-07-09 DIAGNOSIS — H524 Presbyopia: Secondary | ICD-10-CM | POA: Diagnosis not present

## 2019-07-09 DIAGNOSIS — H2513 Age-related nuclear cataract, bilateral: Secondary | ICD-10-CM | POA: Diagnosis not present

## 2019-07-26 IMAGING — MG MM DIGITAL DIAGNOSTIC BILAT W/ TOMO W/ CAD
6 of 10 series · 6 of 30 positions shown · non-contrast
Comparison: Previous exam(s).

CLINICAL DATA: 66-year-old female for follow-up of LEFT breast
masses and for annual bilateral mammograms.

EXAM:
DIGITAL DIAGNOSTIC BILATERAL MAMMOGRAM WITH CAD AND TOMO
ULTRASOUND LEFT BREAST

[R MLO synth-2D]
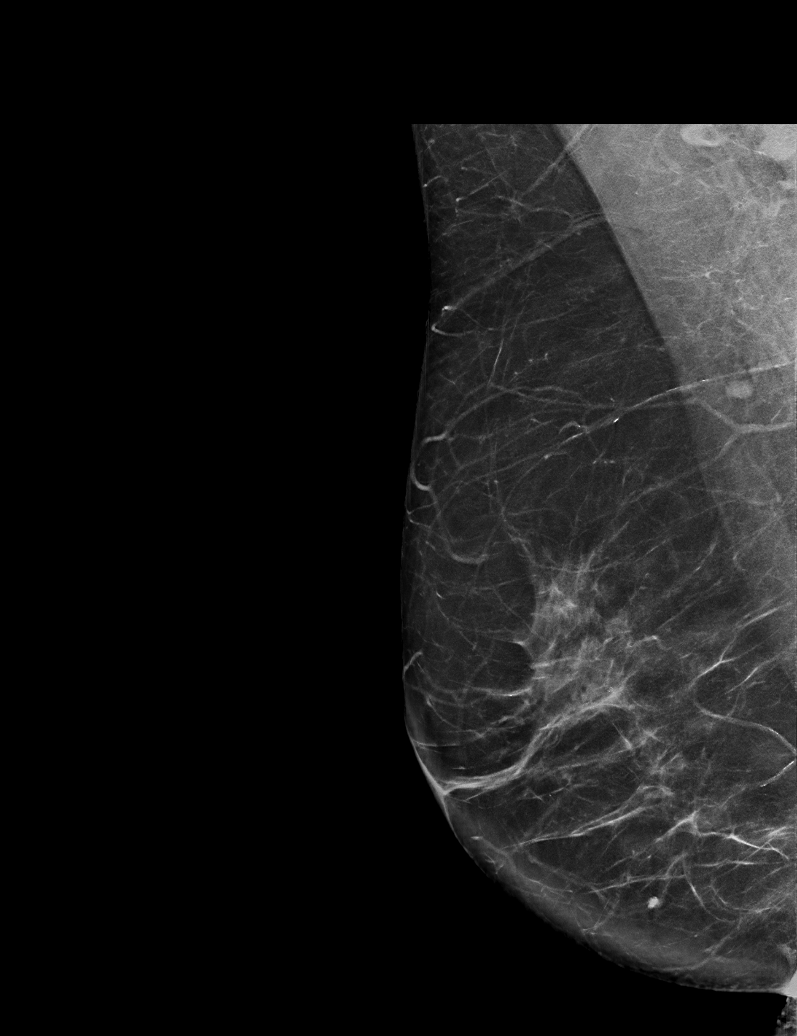

[R CC synth-2D]
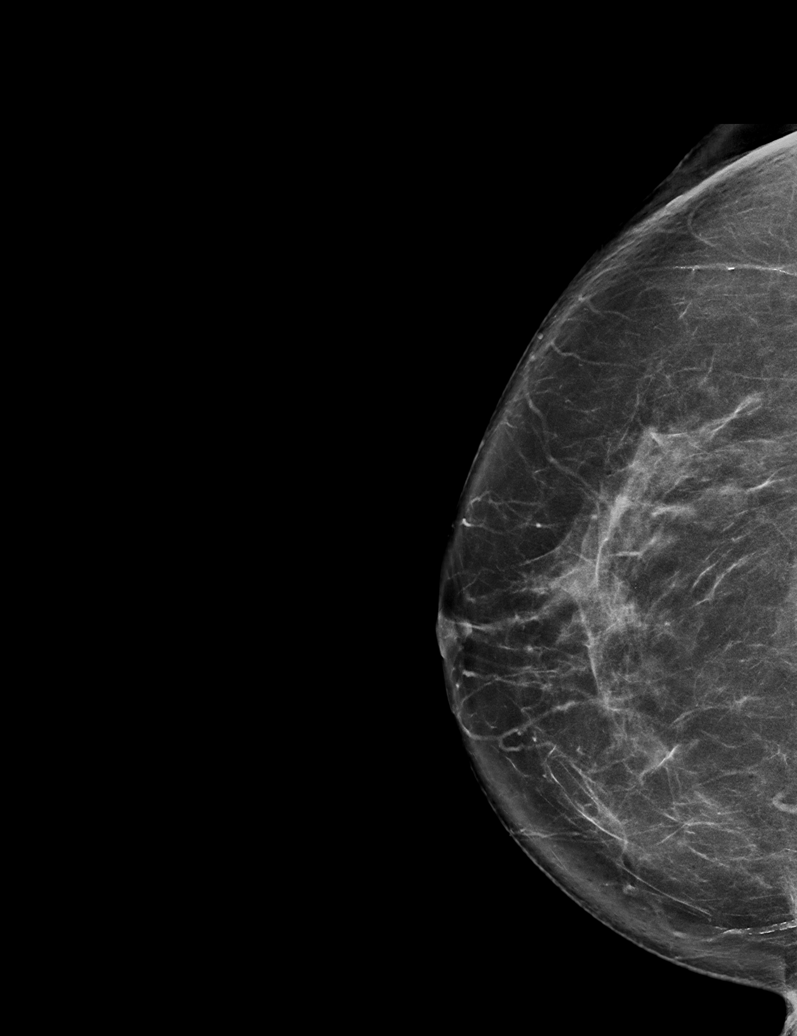

[L MLO synth-2D (1 of 2)]
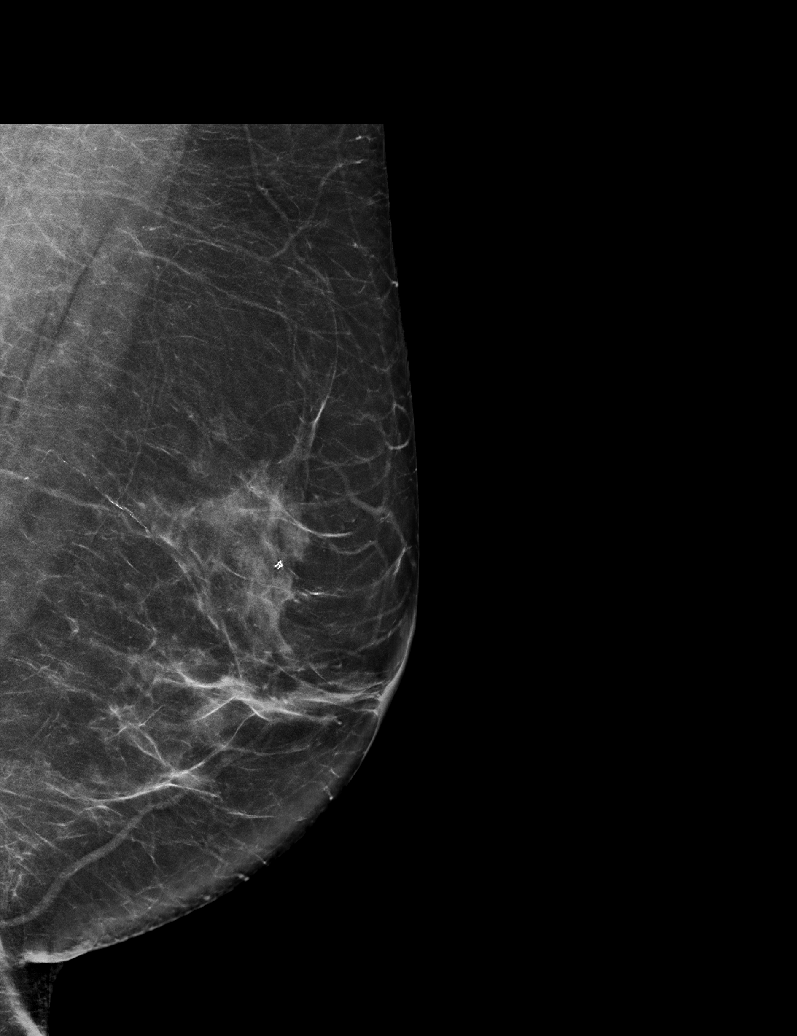

[L MLO synth-2D (2 of 2)]
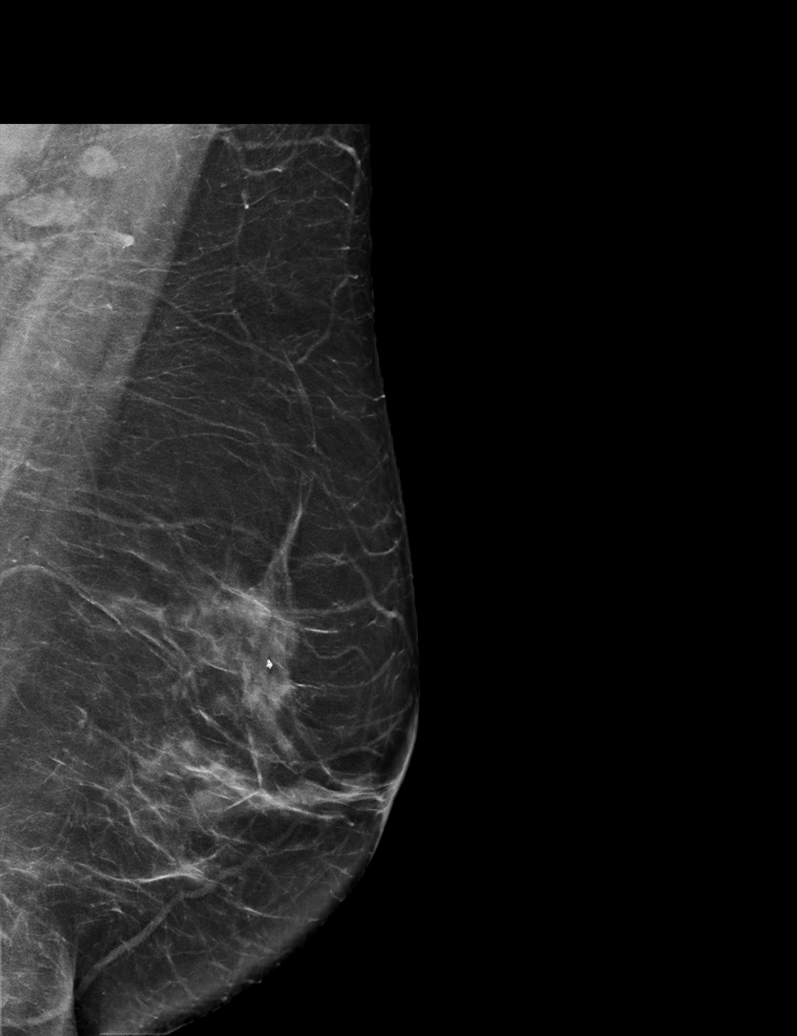

[L CC synth-2D]
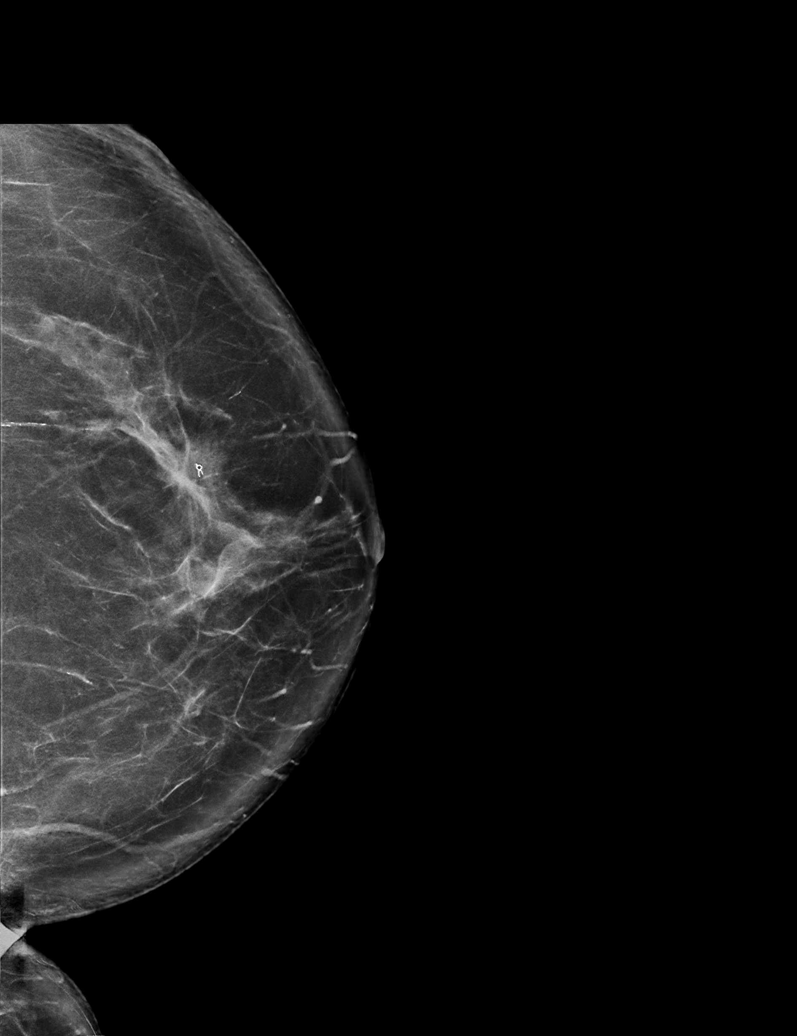

[L MLO tomo · tomo slice 39/76.0]
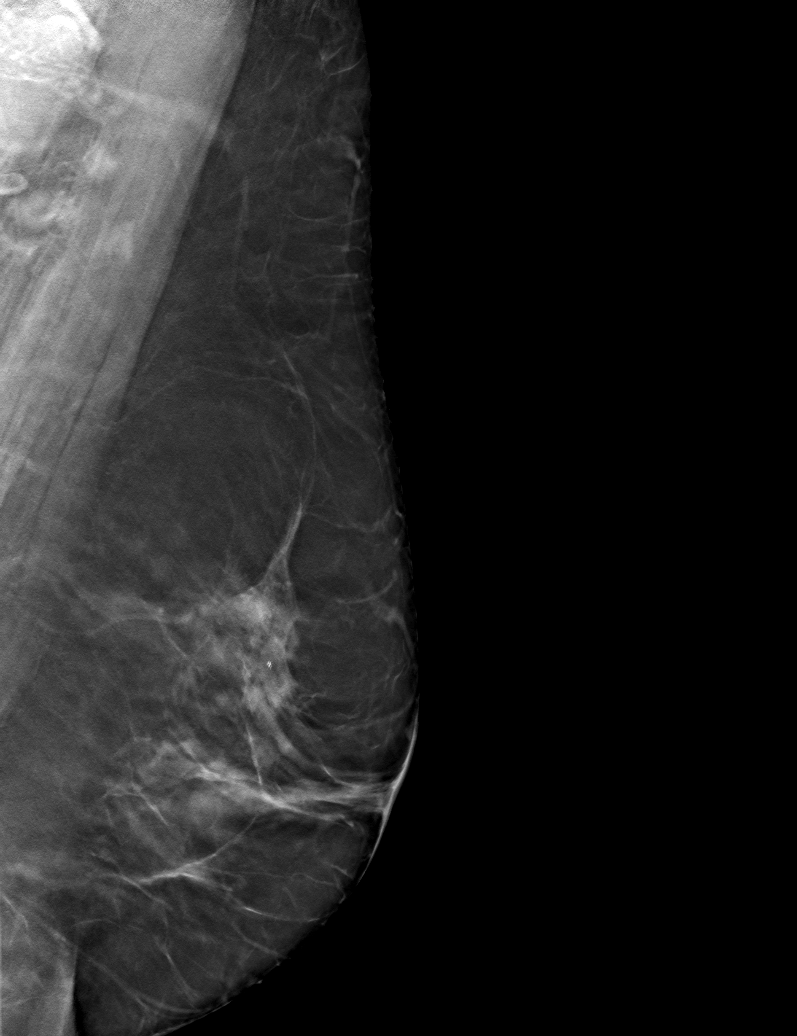

[6 of 30 positions shown; findings below may reference images not displayed]

ACR Breast Density Category b: There are scattered areas of
fibroglandular density.
FINDINGS: 2D/3D full field views of both breasts and spot compression view of
the LEFT breast demonstrate no suspicious mass, nonsurgical
distortion or worrisome calcifications.

Mammographic images were processed with CAD.

Targeted ultrasound is performed, showing 2 benign-appearing
circumscribed oval hypoechoic parallel masses in the LEFT breast.
These measure 4 x 2 x 4 mm at the 3 o'clock position 3 cm from the
nipple and 9 x 4 x 7 mm at the 1 o'clock position 4 cm from the
nipple.
IMPRESSION: Benign appearing LEFT breast masses/cysts. No further imaging
follow-up recommended.

No mammographic evidence of breast malignancy.

RECOMMENDATION:
Bilateral screening mammograms in 1 year.

I have discussed the findings and recommendations with the patient.
Results were also provided in writing at the conclusion of the
visit. If applicable, a reminder letter will be sent to the patient
regarding the next appointment.

BI-RADS CATEGORY  2: Benign.

## 2019-08-03 ENCOUNTER — Other Ambulatory Visit: Payer: Self-pay | Admitting: Internal Medicine

## 2019-08-05 ENCOUNTER — Other Ambulatory Visit: Payer: Medicare PPO

## 2019-08-08 ENCOUNTER — Ambulatory Visit: Payer: Medicare PPO | Admitting: Internal Medicine

## 2019-08-12 ENCOUNTER — Other Ambulatory Visit: Payer: Medicare PPO

## 2019-08-13 ENCOUNTER — Other Ambulatory Visit: Payer: Medicare PPO

## 2019-08-16 ENCOUNTER — Ambulatory Visit: Payer: Medicare PPO | Admitting: Internal Medicine

## 2019-08-16 ENCOUNTER — Telehealth: Payer: Self-pay

## 2019-08-16 NOTE — Telephone Encounter (Signed)
Left message for patient to return call back. Patient is due for cologuard, need to see if patient is ok to do again or want a colonoscopy.

## 2019-09-17 ENCOUNTER — Ambulatory Visit: Payer: Medicare Other

## 2019-09-27 ENCOUNTER — Other Ambulatory Visit (INDEPENDENT_AMBULATORY_CARE_PROVIDER_SITE_OTHER): Payer: Medicare PPO

## 2019-09-27 ENCOUNTER — Other Ambulatory Visit: Payer: Self-pay

## 2019-09-27 DIAGNOSIS — E7800 Pure hypercholesterolemia, unspecified: Secondary | ICD-10-CM

## 2019-09-27 LAB — HEPATIC FUNCTION PANEL
ALT: 22 U/L (ref 0–35)
AST: 24 U/L (ref 0–37)
Albumin: 4.5 g/dL (ref 3.5–5.2)
Alkaline Phosphatase: 75 U/L (ref 39–117)
Bilirubin, Direct: 0.2 mg/dL (ref 0.0–0.3)
Total Bilirubin: 1.3 mg/dL — ABNORMAL HIGH (ref 0.2–1.2)
Total Protein: 6.8 g/dL (ref 6.0–8.3)

## 2019-09-30 ENCOUNTER — Other Ambulatory Visit: Payer: Self-pay | Admitting: Internal Medicine

## 2019-09-30 ENCOUNTER — Telehealth: Payer: Self-pay | Admitting: Internal Medicine

## 2019-09-30 ENCOUNTER — Telehealth: Payer: Self-pay

## 2019-09-30 DIAGNOSIS — R739 Hyperglycemia, unspecified: Secondary | ICD-10-CM

## 2019-09-30 DIAGNOSIS — E7800 Pure hypercholesterolemia, unspecified: Secondary | ICD-10-CM

## 2019-09-30 NOTE — Telephone Encounter (Signed)
LMTCB in regards to lab results.  

## 2019-09-30 NOTE — Telephone Encounter (Signed)
Pt was returning call 

## 2019-09-30 NOTE — Telephone Encounter (Signed)
See result note.  

## 2019-09-30 NOTE — Progress Notes (Signed)
Orders placed for labs

## 2019-10-01 ENCOUNTER — Encounter: Payer: Self-pay | Admitting: Internal Medicine

## 2019-10-01 ENCOUNTER — Ambulatory Visit: Payer: Medicare PPO | Admitting: Internal Medicine

## 2019-10-01 ENCOUNTER — Other Ambulatory Visit: Payer: Self-pay

## 2019-10-01 VITALS — BP 112/68 | HR 84 | Temp 98.7°F | Ht 64.02 in | Wt 148.8 lb

## 2019-10-01 DIAGNOSIS — R928 Other abnormal and inconclusive findings on diagnostic imaging of breast: Secondary | ICD-10-CM

## 2019-10-01 DIAGNOSIS — Z1211 Encounter for screening for malignant neoplasm of colon: Secondary | ICD-10-CM

## 2019-10-01 DIAGNOSIS — E7800 Pure hypercholesterolemia, unspecified: Secondary | ICD-10-CM | POA: Diagnosis not present

## 2019-10-01 DIAGNOSIS — F439 Reaction to severe stress, unspecified: Secondary | ICD-10-CM

## 2019-10-01 DIAGNOSIS — Z1231 Encounter for screening mammogram for malignant neoplasm of breast: Secondary | ICD-10-CM | POA: Diagnosis not present

## 2019-10-01 DIAGNOSIS — R739 Hyperglycemia, unspecified: Secondary | ICD-10-CM | POA: Diagnosis not present

## 2019-10-01 LAB — LIPID PANEL
Cholesterol: 179 mg/dL (ref 0–200)
HDL: 64.2 mg/dL (ref >39.00)
LDL Cholesterol: 99 mg/dL (ref 0–99)
NonHDL: 115.09
Total CHOL/HDL Ratio: 3
Triglycerides: 79 mg/dL (ref 0.0–149.0)
VLDL: 15.8 mg/dL (ref 0.0–40.0)

## 2019-10-01 LAB — BASIC METABOLIC PANEL
BUN: 15 mg/dL (ref 6–23)
CO2: 25 mEq/L (ref 19–32)
Calcium: 9.5 mg/dL (ref 8.4–10.5)
Chloride: 106 mEq/L (ref 96–112)
Creatinine, Ser: 0.76 mg/dL (ref 0.40–1.20)
GFR: 75.25 mL/min (ref >60.00)
Glucose, Bld: 105 mg/dL — ABNORMAL HIGH (ref 70–99)
Potassium: 4.4 mEq/L (ref 3.5–5.1)
Sodium: 140 mEq/L (ref 135–145)

## 2019-10-01 LAB — HEMOGLOBIN A1C: Hgb A1c MFr Bld: 5.9 % (ref 4.6–6.5)

## 2019-10-01 MED ORDER — SERTRALINE HCL 50 MG PO TABS
50.0000 mg | ORAL_TABLET | Freq: Every day | ORAL | 2 refills | Status: DC
Start: 2019-10-01 — End: 2019-12-24

## 2019-10-01 NOTE — Progress Notes (Signed)
Patient ID: Theresa Francis, female   DOB: January 12, 1951, 69 y.o.   MRN: [id number]   Subjective:    Patient ID: Theresa Francis, female    DOB: April 02, 1950, 69 y.o.   MRN: [id number]  HPI This visit occurred during the SARS-CoV-2 public health emergency.  Safety protocols were in place, including screening questions prior to the visit, additional usage of staff PPE, and extensive cleaning of exam room while observing appropriate contact time as indicated for disinfecting solutions.  Patient here for a scheduled follow up. Increased stress with covid, etc. Discussed with her today.  Discussed restarting zoloft.  She did well with zoloft. Tries to stay active.  No chest pain or sob with increased activity or exertion.  No acid reflux reported.  No abdominal pain or bowel change reported.  Discussed GI referral for colonoscopy.  Discussed mammogram due in 10/2019.    Past Medical History:  Diagnosis Date  . Abnormal Pap smear    s/p conization.   . Basal cell carcinoma    eye lid  . History of chicken pox   . History of shingles   . Osteoarthritis   . Urine incontinence    H/O occasional   Past Surgical History:  Procedure Laterality Date  . BREAST BIOPSY Left 11/03/2014   neg./ done by Dr. Bary Castilla in office  . BREAST CYST ASPIRATION Left   . COLONOSCOPY  2008   Dr Sonny Masters  . 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 FINE NEEDLE ASPIRATION Left 2010   Dr Bary Castilla  . TONSILLECTOMY  1967   Family History  Problem Relation Age of Onset  . Breast cancer Mother 34  . Heart disease Father   . Breast cancer Sister        angiosarcoma   Social History   Socioeconomic History  . Marital status: Married    Spouse name: Not on file  . Number of children: 3  . Years of education: Not on file  . Highest education level: Not on file  Occupational History  . Not on file  Tobacco Use  . Smoking status: Never Smoker  . Smokeless tobacco: Never Used  Vaping Use  . Vaping  Use: Never used  Substance and Sexual Activity  . Alcohol use: Yes    Alcohol/week: 0.0 standard drinks    Comment: drinks wine occasionally  . Drug use: No  . Sexual activity: Not on file  Other Topics Concern  . Not on file  Social History Narrative  . Not on file   Social Determinants of Health   Financial Resource Strain:   . Difficulty of Paying Living Expenses: Not on file  Food Insecurity:   . Worried About Charity fundraiser in the Last Year: Not on file  . Ran Out of Food in the Last Year: Not on file  Transportation Needs:   . Lack of Transportation (Medical): Not on file  . Lack of Transportation (Non-Medical): Not on file  Physical Activity:   . Days of Exercise per Week: Not on file  . Minutes of Exercise per Session: Not on file  Stress:   . Feeling of Stress : Not on file  Social Connections:   . Frequency of Communication with Friends and Family: Not on file  . Frequency of Social Gatherings with Friends and Family: Not on file  . Attends Religious Services: Not on file  . Active Member of Clubs or Organizations: Not on file  .  Attends Archivist Meetings: Not on file  . Marital Status: Not on file    Outpatient Encounter Medications as of 10/01/2019  Medication Sig  . acetaminophen (TYLENOL) 650 MG CR tablet Take 1,300 mg by mouth every 6 (six) hours as needed for pain.  . Calcium Carbonate-Vitamin D (CALTRATE 600+D PO) Take by mouth daily.  . Cholecalciferol (D3 SUPER STRENGTH) 2000 units CAPS Take 1 capsule by mouth daily.  . ondansetron (ZOFRAN) 4 MG tablet Take 1 tablet (4 mg total) by mouth 2 (two) times daily as needed for nausea or vomiting.  . rosuvastatin (CRESTOR) 5 MG tablet TAKE ONE TABLET BY MOUTH EACH MONDAY [facility] AND FRIDAY  . sertraline (ZOLOFT) 50 MG tablet Take 1 tablet (50 mg total) by mouth daily.   No facility-administered encounter medications on file as of 10/01/2019.    Review of Systems  Constitutional: Negative  for appetite change and unexpected weight change.  HENT: Negative for congestion and sinus pressure.   Respiratory: Negative for cough, chest tightness and shortness of breath.   Cardiovascular: Negative for chest pain, palpitations and leg swelling.  Gastrointestinal: Negative for abdominal pain, diarrhea, nausea and vomiting.  Genitourinary: Negative for difficulty urinating and dysuria.  Musculoskeletal: Negative for joint swelling and myalgias.  Skin: Negative for color change and rash.  Neurological: Negative for dizziness, light-headedness and headaches.  Psychiatric/Behavioral: Negative for agitation and dysphoric mood.       Increased stress as outlined.         Objective:    Physical Exam Vitals reviewed.  Constitutional:      General: She is not in acute distress.    Appearance: Normal appearance.  HENT:     Head: Normocephalic and atraumatic.     Right Ear: External ear normal.     Left Ear: External ear normal.  Eyes:     General: No scleral icterus.       Right eye: No discharge.        Left eye: No discharge.     Conjunctiva/sclera: Conjunctivae normal.  Neck:     Thyroid: No thyromegaly.  Cardiovascular:     Rate and Rhythm: Normal rate and regular rhythm.  Pulmonary:     Effort: No respiratory distress.     Breath sounds: Normal breath sounds. No wheezing.  Abdominal:     General: Bowel sounds are normal.     Palpations: Abdomen is soft.     Tenderness: There is no abdominal tenderness.  Musculoskeletal:        General: No swelling or tenderness.     Cervical back: Neck supple. No tenderness.  Lymphadenopathy:     Cervical: No cervical adenopathy.  Skin:    Findings: No erythema or rash.  Neurological:     Mental Status: She is alert.  Psychiatric:        Mood and Affect: Mood normal.        Behavior: Behavior normal.     BP 112/68 (BP Location: Left Arm, Patient Position: Sitting)   Pulse 84   Temp 98.7 F (37.1 C)   Ht 5' 4.02" (1.626 m)    Wt 148 lb 12.8 oz (67.5 kg)   SpO2 97%   BMI 25.53 kg/m  Wt Readings from Last 3 Encounters:  10/01/19 148 lb 12.8 oz (67.5 kg)  04/17/19 153 lb 12.8 oz (69.8 kg)  12/19/18 151 lb 12.8 oz (68.9 kg)     Lab Results  Component Value Date   WBC  5.7 03/15/2019   HGB 13.7 03/15/2019   HCT 41.4 03/15/2019   PLT 285.0 03/15/2019   GLUCOSE 105 (H) 10/01/2019   CHOL 179 10/01/2019   TRIG 79.0 10/01/2019   HDL 64.20 10/01/2019   LDLDIRECT 130.6 11/26/2012   LDLCALC 99 10/01/2019   ALT 22 09/27/2019   AST 24 09/27/2019   NA 140 10/01/2019   K 4.4 10/01/2019   CL 106 10/01/2019   CREATININE 0.76 10/01/2019   BUN 15 10/01/2019   CO2 25 10/01/2019   TSH 1.82 03/15/2019   HGBA1C 5.9 10/01/2019    DG Bone Density  Result Date: 05/29/2019 EXAM: DUAL X-RAY ABSORPTIOMETRY (DXA) FOR BONE MINERAL DENSITY IMPRESSION: Your patient Wilmer Berryhill completed a BMD test on 05/29/2019 using the [employer] (software version: 14.10) manufactured by [employer]. The following summarizes the results of our evaluation. Technologist: MTB PATIENT BIOGRAPHICAL: Name: Eriyana, Sweeten Patient ID: [id number] Birth Date: April 13, 1950 Height: 64.0 in. Gender: Female Exam Date: 05/29/2019 Weight: 151.0 lbs. Indications: Caucasian, Family Hx of Osteoporosis, Height Loss, History of Fracture (Adult), Osteoporotic, Postmenopausal, Vitamin D Deficiency Fractures: Left elbow Treatments: calcium w/ vit D, Vitamin D DENSITOMETRY RESULTS: Site         Region     Measured Date Measured Age WHO Classification Young Adult T-score BMD         %Change vs. Previous Significant Change (*) Left Forearm Radius 33% 05/29/2019 69.5 Oste[age] -1.3 0.766 g/cm2 -3.5% - Left Forearm Radius 33% 03/07/2017 67.3 Normal -0.9 0.794 g/cm2 -5.3% Yes Left Forearm Radius 33% 10/16/2013 63.9 Normal -0.4 0.838 g/cm2 9.5% Yes Left Forearm Radius 33% 02/24/2009 59.3 Osteopenia -1.3 0.765 g/cm2 -3.7% - Left Forearm Radius  33% 02/12/2007 57.2 Normal -0.9 0.794 g/cm2 - - ASSESSMENT: The BMD measured at Forearm Radius 33% is 0.766 g/cm2 with a T-score of -1.3. This patient is considered OSTEOPENIC according to [facility]) criteria. The scan quality is good. Lumbar spine was not utilized due to advanced degenerative changes. Left and Right femurs were excluded due to surgical hardware. World Pharmacologist [facility]) criteria for post-menopausal, Caucasian Women: Normal:                   T-score at or above -1 SD Osteopenia/low bone mass: T-score between -1 and -2.5 SD Osteoporosis:             T-score at or below -2.5 SD RECOMMENDATIONS: 1. All patients should optimize calcium and vitamin D intake. 2. Consider FDA-approved medical therapies in postmenopausal women and men aged 15 years and older, based on the following: a. A hip or vertebral(clinical or morphometric) fracture b. T-score < -2.5 at the femoral neck or spine after appropriate evaluation to exclude secondary causes c. Low bone mass (T-score between -1.0 and -2.5 at the femoral neck or spine) and a 10-year probability of a hip fracture > 3% or a 10-year probability of a major osteoporosis-related fracture > 20% based on the [place]-adapted WHO algorithm 3. Clinician judgment and/or patient preferences may indicate treatment for people with 10-year fracture probabilities above or below these levels FOLLOW-UP: People with diagnosed cases of osteoporosis or at high risk for fracture should have regular bone mineral density tests. For patients eligible for Medicare, routine testing is allowed once every 2 years. The testing frequency can be increased to one year for patients who have rapidly progressing disease, those who are receiving or discontinuing medical therapy to restore bone mass, or have additional  risk factors. I have reviewed this report, and agree with the above findings. Mark A. Thornton Papas, M.D. [facility] Electronically Signed   By: Lavonia Dana M.D.   On: 05/29/2019 14:59       Assessment & Plan:   Problem List Items Addressed This Visit    Stress    Increased stress as outlined.  Discussed with her.  She has good support.  Discussed restarting zoloft.  She is in agreement.  Follow closely.       Hypercholesterolemia    On low dose crestor.  Low cholesterol diet and exercise.  Follow lipid panel and liver function tests.        Hyperbilirubinemia    Bilirubin has been stable.  Slight elevation.  Recheck liver panel today.        Colon cancer screening    Due colonoscopy.  Pt prefers female gastroenterologist.        Relevant Orders   Ambulatory referral to Gastroenterology   Abnormal mammogram    Mammogram 11/02/18 -birads 0.  F/u 9/21 - Birads II.  She will call and schedule mammogram.         Other Visit Diagnoses    Encounter for screening mammogram for malignant neoplasm of breast    -  Primary   Relevant Orders   MM 3D SCREEN BREAST BILATERAL   Hyperglycemia           Einar Pheasant, MD

## 2019-10-13 ENCOUNTER — Encounter: Payer: Self-pay | Admitting: Internal Medicine

## 2019-10-13 DIAGNOSIS — Z1211 Encounter for screening for malignant neoplasm of colon: Secondary | ICD-10-CM | POA: Insufficient documentation

## 2019-10-13 NOTE — Assessment & Plan Note (Signed)
Mammogram 11/02/18 -birads 0.  F/u 9/21 - Birads II.  She will call and schedule mammogram.

## 2019-10-13 NOTE — Assessment & Plan Note (Signed)
On low dose crestor.  Low cholesterol diet and exercise.  Follow lipid panel and liver function tests.   

## 2019-10-13 NOTE — Assessment & Plan Note (Signed)
Bilirubin has been stable.  Slight elevation.  Recheck liver panel today.

## 2019-10-13 NOTE — Assessment & Plan Note (Signed)
Increased stress as outlined.  Discussed with her.  She has good support.  Discussed restarting zoloft.  She is in agreement.  Follow closely.

## 2019-10-13 NOTE — Assessment & Plan Note (Signed)
Due colonoscopy.  Pt prefers female gastroenterologist.

## 2019-10-22 ENCOUNTER — Other Ambulatory Visit: Payer: Self-pay

## 2019-10-22 ENCOUNTER — Telehealth (INDEPENDENT_AMBULATORY_CARE_PROVIDER_SITE_OTHER): Payer: Self-pay | Admitting: Gastroenterology

## 2019-10-22 DIAGNOSIS — Z1211 Encounter for screening for malignant neoplasm of colon: Secondary | ICD-10-CM

## 2019-10-22 DIAGNOSIS — IMO0001 Reserved for inherently not codable concepts without codable children: Secondary | ICD-10-CM | POA: Insufficient documentation

## 2019-10-22 MED ORDER — NA SULFATE-K SULFATE-MG SULF 17.5-3.13-1.6 GM/177ML PO SOLN: 1.0000 | Freq: Once | ORAL | 0 refills | Status: AC

## 2019-10-22 NOTE — Progress Notes (Signed)
Gastroenterology Pre-Procedure Review  Request Date: Wed 11/06/19 Requesting Physician: Dr. 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 Tahiliani and she and her husband really liked her. She would like Dr. Tahiliani to perform her colonoscopy and would like to see her going forward for any GI care she may need.  PATIENT REVIEW QUESTIONS: The patient responded to the following health history questions as indicated:    1. Are you having any GI issues? no 2. Do you have a personal history of Polyps? no 3. Do you have a family history of Colon Cancer or Polyps? no 4. Diabetes Mellitus? no 5. Joint replacements in the past 12 months?no 6. Major health problems in the past 3 months?no 7. Any artificial heart valves, MVP, or defibrillator?no    MEDICATIONS & ALLERGIES:    Patient reports the following regarding taking any anticoagulation/antiplatelet therapy:   Plavix, Coumadin, Eliquis, Xarelto, Lovenox, Pradaxa, Brilinta, or Effient? no Aspirin? no  Patient confirms/reports the following medications:  Current Outpatient Medications  Medication Sig Dispense Refill  . acetaminophen (TYLENOL) 650 MG CR tablet Take 1,300 mg by mouth every 6 (six) hours as needed for pain.    . Calcium Carbonate-Vitamin D (CALTRATE 600+D PO) Take by mouth daily.    . Cholecalciferol (D3 SUPER STRENGTH) 2000 units CAPS Take 1 capsule by mouth daily.    . ondansetron (ZOFRAN) 4 MG tablet Take 1 tablet (4 mg total) by mouth 2 (two) times daily as needed for nausea or vomiting. 15 tablet 0  . rosuvastatin (CRESTOR) 5 MG tablet TAKE ONE TABLET BY MOUTH EACH MONDAY WEDNESDAY AND FRIDAY 30 tablet 1  . sertraline (ZOLOFT) 50 MG tablet Take 1 tablet (50 mg total) by mouth daily. 30 tablet 2  . Na Sulfate-K Sulfate-Mg Sulf 17.5-3.13-1.6 GM/177ML SOLN Take 1 kit by mouth once for 1 dose. 354 mL 0   No current facility-administered medications for this  visit.    Patient confirms/reports the following allergies:  No Known Allergies  No orders of the defined types were placed in this encounter.   AUTHORIZATION INFORMATION Primary Insurance: 1D#: Group #:  Secondary Insurance: 1D#: Group #:  SCHEDULE INFORMATION: Date: Wed 11/06/19 Time: Location:[facility] 

## 2019-10-25 ENCOUNTER — Telehealth: Payer: Self-pay | Admitting: Internal Medicine

## 2019-10-25 ENCOUNTER — Encounter: Payer: Self-pay | Admitting: Internal Medicine

## 2019-10-25 DIAGNOSIS — R519 Headache, unspecified: Secondary | ICD-10-CM | POA: Diagnosis not present

## 2019-10-25 DIAGNOSIS — R05 Cough: Secondary | ICD-10-CM | POA: Diagnosis not present

## 2019-10-25 DIAGNOSIS — R197 Diarrhea, unspecified: Secondary | ICD-10-CM | POA: Diagnosis not present

## 2019-10-25 DIAGNOSIS — U071 COVID-19: Secondary | ICD-10-CM | POA: Diagnosis not present

## 2019-10-25 DIAGNOSIS — R0981 Nasal congestion: Secondary | ICD-10-CM | POA: Diagnosis not present

## 2019-10-25 DIAGNOSIS — R059 Cough, unspecified: Secondary | ICD-10-CM | POA: Insufficient documentation

## 2019-10-25 DIAGNOSIS — R030 Elevated blood-pressure reading, without diagnosis of hypertension: Secondary | ICD-10-CM | POA: Diagnosis not present

## 2019-10-25 NOTE — Telephone Encounter (Signed)
Patient called in she is out of town she stated that she has just tested positive to covid and they suggest she take the infusions,she wanted to know what Dr.Scott though about this she stated that Dr.Scott could call her or sent her a message on Mychart [phone]

## 2019-10-29 ENCOUNTER — Telehealth (INDEPENDENT_AMBULATORY_CARE_PROVIDER_SITE_OTHER): Payer: Medicare PPO | Admitting: Internal Medicine

## 2019-10-29 DIAGNOSIS — U071 COVID-19: Secondary | ICD-10-CM

## 2019-10-29 NOTE — Telephone Encounter (Signed)
See phone note

## 2019-10-29 NOTE — Progress Notes (Signed)
Patient ID: Theresa Francis, female   DOB: October 28, 1950, 69 y.o.   MRN: [id number]   Virtual Visit via video Note  This visit type was conducted due to national recommendations for restrictions regarding the COVID-19 pandemic (e.g. social distancing).  This format is felt to be most appropriate for this patient at this time.  All issues noted in this document were discussed and addressed.  No physical exam was performed (except for noted visual exam findings with Video Visits).   I connected with Theresa Francis by a video enabled telemedicine application and verified that I am speaking with the correct person using two identifiers. Location patient: home Location provider: work  Persons participating in the virtual visit: patient, provider  The limitations, risks, security and privacy concerns of performing an evaluation and management service by video and the availability of in person appointments have been discussed.  It has also been discussed with the patient that there may be a patient responsible charge related to this service. The patient expressed understanding and agreed to proceed.   Reason for visit: work in appt.   HPI: Work in appt - recently diagnosed with covid.  [place]. States she started having symptoms - headache 10/21/19.  Noticed waking up at night - hot.  Initially thought was allergies.  9/3 - developed diarrhea.  Some hoarseness now.  Feeling better.  No headache.  No fever.  Eating.  Trying to stay hydrated.  No chest pain or sob.  No chest congestion or chest tightness.  No nausea or vomiting.  Stool still loose, but no increased diarrhea.  She has been vaccinated.    ROS: See pertinent positives and negatives per HPI.  Past Medical History:  Diagnosis Date  . Abnormal Pap smear    s/p conization.   . Basal cell carcinoma    eye lid  . History of chicken pox   . History of shingles   . Osteoarthritis   . Urine incontinence    H/O  occasional    Past Surgical History:  Procedure Laterality Date  . BREAST BIOPSY Left 11/03/2014   neg./ done by Dr. Bary Castilla in office  . BREAST CYST ASPIRATION Left   . COLONOSCOPY  2008   Dr Sonny Masters  . 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 FINE NEEDLE ASPIRATION Left 2010   Dr Bary Castilla  . TONSILLECTOMY  1967    Family History  Problem Relation Age of Onset  . Breast cancer Mother 66  . Heart disease Father   . Breast cancer Sister        angiosarcoma    SOCIAL HX: reviewed.    Current Outpatient Medications:  .  acetaminophen (TYLENOL) 650 MG CR tablet, Take 1,300 mg by mouth every 6 (six) hours as needed for pain., Disp: , Rfl:  .  Calcium Carbonate-Vitamin D (CALTRATE 600+D PO), Take by mouth daily., Disp: , Rfl:  .  Cholecalciferol (D3 SUPER STRENGTH) 2000 units CAPS, Take 1 capsule by mouth daily., Disp: , Rfl:  .  ondansetron (ZOFRAN) 4 MG tablet, Take 1 tablet (4 mg total) by mouth 2 (two) times daily as needed for nausea or vomiting., Disp: 15 tablet, Rfl: 0 .  rosuvastatin (CRESTOR) 5 MG tablet, TAKE ONE TABLET BY MOUTH EACH MONDAY WEDNESDAY AND FRIDAY, Disp: 30 tablet, Rfl: 1 .  sertraline (ZOLOFT) 50 MG tablet, Take 1 tablet (50 mg total) by mouth daily., Disp: 30 tablet, Rfl: 2  EXAM:  VITALS per patient if applicable: 333/83, 29.1  GENERAL: alert, oriented, appears well and in no acute distress  HEENT: atraumatic, conjunttiva clear, no obvious abnormalities on inspection of external nose and ears  NECK: normal movements of the head and neck  LUNGS: on inspection no signs of respiratory distress, breathing rate appears normal, no obvious gross SOB, gasping or wheezing  CV: no obvious cyanosis  PSYCH/NEURO: pleasant and cooperative, no obvious depression or anxiety, speech and thought processing grossly intact  ASSESSMENT AND PLAN:  Discussed the following assessment and plan:  COVID-19 virus infection Was [place] covid [place].  First noticed headache on 10/21/19.  She is feeling better.  No headache now. No fever. No chest pain, chest tightness or sob.  We discussed monoclonal ab infusion.  Questions answered.  Will submit information.  She is feeling better and appears to be doing well.  Monitor symptoms.  Stay hydrated.  Continue her quarantine.  Questions answered.     I discussed the assessment and treatment plan with the patient. The patient was provided an opportunity to ask questions and all were answered. The patient agreed with the plan and demonstrated an understanding of the instructions.   The patient was advised to call back or seek an in-person evaluation if the symptoms worsen or if the condition fails to improve as anticipated.   Einar Pheasant, MD

## 2019-10-29 NOTE — Telephone Encounter (Signed)
Symptoms started 8/31. Positive on 09/03. Would like to discuss with Dr Nicki Reaper. Confirmed she is feeling ok. Virtual scheduled this PM @ 4:30.

## 2019-10-30 ENCOUNTER — Encounter: Payer: Self-pay | Admitting: Internal Medicine

## 2019-10-30 DIAGNOSIS — Z8616 Personal history of COVID-19: Secondary | ICD-10-CM | POA: Insufficient documentation

## 2019-10-30 NOTE — Assessment & Plan Note (Signed)
Was diagnosed with covid while at the beach.  First noticed headache on 10/21/19.  She is feeling better.  No headache now. No fever. No chest pain, chest tightness or sob.  We discussed monoclonal ab infusion.  Questions answered.  Will submit information.  She is feeling better and appears to be doing well.  Monitor symptoms.  Stay hydrated.  Continue her quarantine.  Questions answered.

## 2019-10-31 ENCOUNTER — Telehealth: Payer: Self-pay

## 2019-10-31 NOTE — Telephone Encounter (Signed)
Patient called to rescheduled colonoscopy on 11/06/2019 with Dr. Bonna Gains. Patient test positive for COVID on 10/25/2019 in [place]. Moved patient to 11/27/2019 for colonoscopy with Dr. Bonna Gains. When patient is out of isolation she will come by office to give [place] her positive covid test. We will then fax to [place]. Informed Wannetta Sender of this information she moved patient to 11/27/2019

## 2019-11-12 ENCOUNTER — Telehealth: Payer: Self-pay

## 2019-11-12 NOTE — Telephone Encounter (Signed)
Patient brought by her covid positive results from 10/25/2019.  Faxed them to the ENDO department and put to scan in patient chart

## 2019-11-18 ENCOUNTER — Encounter: Payer: Self-pay | Admitting: Internal Medicine

## 2019-11-25 ENCOUNTER — Other Ambulatory Visit: Payer: Medicare PPO | Attending: Gastroenterology

## 2019-11-26 ENCOUNTER — Encounter: Payer: Self-pay | Admitting: Gastroenterology

## 2019-11-27 ENCOUNTER — Ambulatory Visit
Admission: RE | Admit: 2019-11-27 | Discharge: 2019-11-27 | Disposition: A | Discharge status: Home / Self Care | Payer: Medicare PPO | Attending: Gastroenterology | Admitting: Gastroenterology

## 2019-11-27 ENCOUNTER — Encounter
Admission: RE | Disposition: A | Discharge status: Home / Self Care | Payer: Self-pay | Source: Home / Self Care | Attending: Gastroenterology

## 2019-11-27 ENCOUNTER — Ambulatory Visit: Payer: Medicare PPO | Admitting: Anesthesiology

## 2019-11-27 DIAGNOSIS — Z85828 Personal history of other malignant neoplasm of skin: Secondary | ICD-10-CM | POA: Insufficient documentation

## 2019-11-27 DIAGNOSIS — M1990 Unspecified osteoarthritis, unspecified site: Secondary | ICD-10-CM | POA: Diagnosis not present

## 2019-11-27 DIAGNOSIS — Z860100 Personal history of colon polyps, unspecified: Secondary | ICD-10-CM

## 2019-11-27 DIAGNOSIS — Z1211 Encounter for screening for malignant neoplasm of colon: Secondary | ICD-10-CM | POA: Diagnosis not present

## 2019-11-27 DIAGNOSIS — K635 Polyp of colon: Secondary | ICD-10-CM | POA: Diagnosis not present

## 2019-11-27 DIAGNOSIS — K621 Rectal polyp: Secondary | ICD-10-CM | POA: Insufficient documentation

## 2019-11-27 DIAGNOSIS — K5790 Diverticulosis of intestine, part unspecified, without perforation or abscess without bleeding: Secondary | ICD-10-CM | POA: Diagnosis not present

## 2019-11-27 DIAGNOSIS — K648 Other hemorrhoids: Secondary | ICD-10-CM | POA: Insufficient documentation

## 2019-11-27 DIAGNOSIS — Z79899 Other long term (current) drug therapy: Secondary | ICD-10-CM | POA: Diagnosis not present

## 2019-11-27 HISTORY — PX: COLONOSCOPY WITH PROPOFOL: SHX5780

## 2019-11-27 SURGERY — COLONOSCOPY WITH PROPOFOL
Anesthesia: General

## 2019-11-27 MED ORDER — PHENYLEPHRINE HCL (PRESSORS) 10 MG/ML IV SOLN
INTRAVENOUS | Status: DC | PRN
  Administered 2019-11-27 (×2): 100 ug via INTRAVENOUS
  Administered 2019-11-27: 200 ug via INTRAVENOUS

## 2019-11-27 MED ORDER — PROPOFOL 500 MG/50ML IV EMUL
INTRAVENOUS | Status: DC | PRN
  Administered 2019-11-27: 150 ug/kg/min via INTRAVENOUS

## 2019-11-27 MED ORDER — SODIUM CHLORIDE 0.9 % IV SOLN
INTRAVENOUS | Status: DC
  Administered 2019-11-27: 20 mL/h via INTRAVENOUS

## 2019-11-27 MED ORDER — LIDOCAINE HCL (CARDIAC) PF 100 MG/5ML IV SOSY
PREFILLED_SYRINGE | INTRAVENOUS | Status: DC | PRN
  Administered 2019-11-27: 30 mg via INTRAVENOUS

## 2019-11-27 MED ORDER — PROPOFOL 500 MG/50ML IV EMUL
INTRAVENOUS | Status: AC
  Filled 2019-11-27: qty 50

## 2019-11-27 MED ORDER — PROPOFOL 10 MG/ML IV BOLUS
INTRAVENOUS | Status: DC | PRN
  Administered 2019-11-27: 80 mg via INTRAVENOUS
  Administered 2019-11-27: 20 mg via INTRAVENOUS

## 2019-11-27 MED ORDER — LIDOCAINE HCL (PF) 2 % IJ SOLN
INTRAMUSCULAR | Status: AC
  Filled 2019-11-27: qty 5

## 2019-11-27 NOTE — H&P (Signed)
Vonda Antigua, MD [address], [place], [place], [place], [address] [address], [place], [place], [place], [address] Phone: [phone]  Fax: [phone]  Primary Care Physician:  Einar Pheasant, MD   Pre-Procedure History & Physical: HPI:  Theresa Francis is a 69 y.o. female is here for a colonoscopy.   Past Medical History:  Diagnosis Date  . Abnormal Pap smear    s/p conization.   . Basal cell carcinoma    eye lid  . History of chicken pox   . History of shingles   . Osteoarthritis   . Urine incontinence    H/O occasional    Past Surgical History:  Procedure Laterality Date  . BREAST BIOPSY Left 11/03/2014   neg./ done by Dr. Bary Castilla in office  . BREAST CYST ASPIRATION Left   . COLONOSCOPY  2008   Dr Sonny Masters  . 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 FINE NEEDLE ASPIRATION Left 2010   Dr Bary Castilla  . TONSILLECTOMY  1967    Prior to Admission medications   Medication Sig Start Date End Date Taking? Authorizing Provider  acetaminophen (TYLENOL) 650 MG CR tablet Take 1,300 mg by mouth every 6 (six) hours as needed for pain.   Yes [provider]  Calcium Carbonate-Vitamin D (CALTRATE 600+D PO) Take by mouth daily.   Yes [provider]  Cholecalciferol (D3 SUPER STRENGTH) 2000 units CAPS Take 1 capsule by mouth daily.   Yes [provider]  ondansetron (ZOFRAN) 4 MG tablet Take 1 tablet (4 mg total) by mouth 2 (two) times daily as needed for nausea or vomiting. 09/14/17  Yes Einar Pheasant, MD  rosuvastatin (CRESTOR) 5 MG tablet TAKE ONE TABLET BY MOUTH EACH MONDAY [facility] AND FRIDAY 08/05/19  Yes Einar Pheasant, MD  sertraline (ZOLOFT) 50 MG tablet Take 1 tablet (50 mg total) by mouth daily. 10/01/19  Yes Einar Pheasant, MD    Allergies as of 10/22/2019  . (No Known Allergies)    Family History  Problem Relation Age of Onset  . Breast cancer Mother 61  . Heart disease Father   . Breast cancer Sister         angiosarcoma    Social History   Socioeconomic History  . Marital status: Married    Spouse name: Not on file  . Number of children: 3  . Years of education: Not on file  . Highest education level: Not on file  Occupational History  . Not on file  Tobacco Use  . Smoking status: Never Smoker  . Smokeless tobacco: Never Used  Vaping Use  . Vaping Use: Never used  Substance and Sexual Activity  . Alcohol use: Yes    Alcohol/week: 0.0 standard drinks    Comment: drinks wine occasionally  . Drug use: No  . Sexual activity: Not on file  Other Topics Concern  . Not on file  Social History Narrative  . Not on file   Social Determinants of Health   Financial Resource Strain:   . Difficulty of Paying Living Expenses: Not on file  Food Insecurity:   . Worried About Charity fundraiser in the Last Year: Not on file  . Ran Out of Food in the Last Year: Not on file  Transportation Needs:   . Lack of Transportation (Medical): Not on file  . Lack of Transportation (Non-Medical): Not on file  Physical Activity:   . Days of Exercise per Week: Not on  file  . Minutes of Exercise per Session: Not on file  Stress:   . Feeling of Stress : Not on file  Social Connections:   . Frequency of Communication with Friends and Family: Not on file  . Frequency of Social Gatherings with Friends and Family: Not on file  . Attends Religious Services: Not on file  . Active Member of Clubs or Organizations: Not on file  . Attends Archivist Meetings: Not on file  . Marital Status: Not on file  Intimate Partner Violence:   . Fear of Current or Ex-Partner: Not on file  . Emotionally Abused: Not on file  . Physically Abused: Not on file  . Sexually Abused: Not on file    Review of Systems: See HPI, otherwise negative ROS  Physical Exam: BP (!) 124/95   Pulse 98   Temp 97.6 F (36.4 C) (Temporal)   Resp 20   Ht 5\' 4"  (1.626 m)   Wt 65.8 kg   SpO2 99%   BMI 24.89 kg/m    General:   Alert,  pleasant and cooperative in NAD Head:  Normocephalic and atraumatic. Neck:  Supple; no masses or thyromegaly. Lungs:  Clear throughout to auscultation, normal respiratory effort.    Heart:  +S1, +S2, Regular rate and rhythm, No edema. Abdomen:  Soft, nontender and nondistended. Normal bowel sounds, without guarding, and without rebound.   Neurologic:  Alert and  oriented x4;  grossly normal neurologically.  Impression/Plan: Theresa Francis is here for a colonoscopy to be performed for average risk screening.  Risks, benefits, limitations, and alternatives regarding  colonoscopy have been reviewed with the patient.  Questions have been answered.  All parties agreeable.   Virgel Manifold, MD  11/27/2019, 11:08 AM

## 2019-11-27 NOTE — Op Note (Signed)
[facility]enter[facility] Patient Name: Theresa Francis Procedure Date: 11/27/2019 11:21 AM MRN: [id number] Account #: [id number] Date of Birth: [birth year] Admit Type: Outpatient Age: 69 Room: [facility] ENDO ROOM 2 Gender: Female Note Status: Finalized Procedure:             Colonoscopy Indications:           Screening for colorectal malignant neoplasm Providers:             Luca Dyar B. Bonna Gains MD, MD Referring MD:          Einar Pheasant, MD (Referring MD) Medicines:             Monitored Anesthesia Care Complications:         No immediate complications. Procedure:             Pre-Anesthesia Assessment:                        - ASA Grade Assessment: II - A patient with mild                         systemic disease.                        - Prior to the procedure, a History and Physical was                         performed, and patient medications, allergies and                         sensitivities were reviewed. The patient's tolerance                         of previous anesthesia was reviewed.                        - The risks and benefits of the procedure and the                         sedation options and risks were discussed with the                         patient. All questions were answered and informed                         consent was obtained.                        - Patient identification and proposed procedure were                         verified prior to the procedure by the physician, the                         nurse, the anesthesiologist, the anesthetist and the                         technician. The procedure was verified in the                         procedure room.  After obtaining informed consent, the colonoscope was                         passed under direct vision. Throughout the procedure,                         the patient's blood pressure, pulse, and oxygen                         saturations were  monitored continuously. The                         Colonoscope was introduced through the anus and                         advanced to the the cecum, identified by appendiceal                         orifice and ileocecal valve. The colonoscopy was                         performed with ease. The patient tolerated the                         procedure well. The quality of the bowel preparation                         was good. Findings:      The perianal and digital rectal examinations were normal.      A 20 mm polyp was found in the ascending colon. The polyp was flat. Area       was successfully injected with 9 mL Eleview for lesion assessment, and       this injection appeared to lift the lesion adequately. Polypectomy was       not attempted due to poor endoscopic visualization.      A 10 mm polyp was found in the ascending colon. The polyp was flat.       Polypectomy was not attempted due to poor endoscopic visualization.       Polyp was seen in retroflexion only and was not able to be identified on       frontal view. The area could not be cleaned well to visualize properly.      A 20 mm polyp was found in the ascending colon. The polyp was flat.       Polypectomy was not attempted due to polyp size (too large to be       excised).      Three flat polyps were found in the rectum, descending colon and       ascending colon. The polyps were 3 to 7 mm in size. These polyps were       removed with a cold snare. Resection and retrieval were complete.      Multiple diverticula were found in the sigmoid colon.      The exam was otherwise without abnormality.      The rectum, sigmoid colon, descending colon, transverse colon, ascending       colon and cecum appeared normal.      The retroflexed view of the distal rectum and anal verge was normal and  showed no anal or rectal abnormalities.      Non-bleeding internal hemorrhoids were found during retroflexion. Impression:             - One 20 mm polyp in the ascending colon. Resection                         not attempted. Injected.                        - One 10 mm polyp in the ascending colon. Resection                         not attempted.                        - One 20 mm polyp in the ascending colon. Resection                         not attempted.                        - Three 3 to 7 mm polyps in the rectum, in the                         descending colon and in the ascending colon, removed                         with a cold snare. Resected and retrieved.                        - Diverticulosis in the sigmoid colon.                        - The examination was otherwise normal.                        - The rectum, sigmoid colon, descending colon,                         transverse colon, ascending colon and cecum are normal.                        - The distal rectum and anal verge are normal on                         retroflexion view.                        - Non-bleeding internal hemorrhoids. Recommendation:        - Refer to Gastroenterology at [facility] for advanced polyp                         removal of ascending colon polyps.                        - Discharge patient to home (with escort).                        - High fiber diet.                        -  Advance diet as tolerated.                        - Continue present medications.                        - Await pathology results.                        - The findings and recommendations were discussed with                         the patient.                        - The findings and recommendations were discussed with                         the patient's family.                        - Return to primary care physician as previously                         scheduled.                        - Return to my office in 4 weeks. Procedure Code(s):     --- Professional ---                        [phone], Colonoscopy, flexible; with removal of                          tumor(s), polyp(s), or other lesion(s) by snare                         technique                        45381, Colonoscopy, flexible; with directed submucosal                         injection(s), any substance Diagnosis Code(s):     --- Professional ---                        Z12.11, Encounter for screening for malignant neoplasm                         of colon                        K63.5, Polyp of colon                        K62.1, Rectal polyp CPT copyright 2019 American Medical Association. All rights reserved. The codes documented in this report are preliminary and upon coder review may  be revised to meet current compliance requirements.  Vonda Antigua, MD Margretta Sidle B. Bonna Gains MD, MD 11/27/2019 12:27:37 PM This report has been signed electronically. Number of Addenda: 0 Note Initiated On: 11/27/2019 11:21 AM Scope Withdrawal Time: 0 hours 29 minutes 50 seconds  Total Procedure Duration:  0 hours 35 minutes 59 seconds  Estimated Blood Loss:  Estimated blood loss: none.      [facility]

## 2019-11-27 NOTE — Transfer of Care (Signed)
Immediate Anesthesia Transfer of Care Note  Patient: Theresa Francis  Procedure(s) Performed: Procedure(s) with comments: COLONOSCOPY WITH PROPOFOL (N/A) - PATIENT COVID + on 9/03/202;   COPY ON CHART  Patient Location: PACU and Endoscopy Unit  Anesthesia Type:General  Level of Consciousness: sedated  Airway & Oxygen Therapy: Patient Spontanous Breathing and Patient connected to nasal cannula oxygen  Post-op Assessment: Report given to RN and Post -op Vital signs reviewed and stable  Post vital signs: Reviewed and stable  Last Vitals:  Vitals:   11/27/19 1055 11/27/19 1218  BP: (!) 124/95 (!) 92/58  Pulse: 98 77  Resp: 20 18  Temp: 36.4 C 36.6 C  SpO2: 86% 77%    Complications: No apparent anesthesia complications

## 2019-11-27 NOTE — Anesthesia Preprocedure Evaluation (Signed)
Anesthesia Evaluation  Patient identified by MRN, date of birth, ID band Patient awake    Reviewed: Allergy & Precautions, NPO status , Patient's Chart, lab work & pertinent test results  History of Anesthesia Complications Negative for: history of anesthetic complications  Airway Mallampati: II  TM Distance: >3 FB Neck ROM: Full    Dental no notable dental hx.    Pulmonary neg pulmonary ROS, neg sleep apnea, neg COPD,    breath sounds clear to auscultation- rhonchi (-) wheezing      Cardiovascular Exercise Tolerance: Good (-) hypertension(-) CAD, (-) Past MI, (-) Cardiac Stents and (-) CABG  Rhythm:Regular Rate:Normal - Systolic murmurs and - Diastolic murmurs    Neuro/Psych neg Seizures negative neurological ROS  negative psych ROS   GI/Hepatic Neg liver ROS, GERD  ,  Endo/Other  negative endocrine ROSneg diabetes  Renal/GU negative Renal ROS     Musculoskeletal  (+) Arthritis ,   Abdominal (+) - obese,   Peds  Hematology negative hematology ROS (+)   Anesthesia Other Findings Past Medical History: No date: Abnormal Pap smear     Comment:  s/p conization.  No date: Basal cell carcinoma     Comment:  eye lid No date: History of chicken pox No date: History of shingles No date: Osteoarthritis No date: Urine incontinence     Comment:  H/O occasional   Reproductive/Obstetrics                             Anesthesia Physical Anesthesia Plan  ASA: II  Anesthesia Plan: General   Post-op Pain Management:    Induction: Intravenous  PONV Risk Score and Plan: 2 and Propofol infusion  Airway Management Planned: Natural Airway  Additional Equipment:   Intra-op Plan:   Post-operative Plan:   Informed Consent: I have reviewed the patients History and Physical, chart, labs and discussed the procedure including the risks, benefits and alternatives for the proposed anesthesia with  the patient or authorized representative who has indicated his/her understanding and acceptance.     Dental advisory given  Plan Discussed with: CRNA and Anesthesiologist  Anesthesia Plan Comments:         Anesthesia Quick Evaluation

## 2019-11-27 NOTE — Anesthesia Procedure Notes (Signed)
Performed by: Tamyia Minich, CRNA Pre-anesthesia Checklist: Patient identified, Emergency Drugs available, Suction available and Patient being monitored Patient Re-evaluated:Patient Re-evaluated prior to induction Oxygen Delivery Method: Nasal cannula Induction Type: IV induction Dental Injury: Teeth and Oropharynx as per pre-operative assessment  Comments: Nasal cannula with etCO2 monitoring       

## 2019-11-27 NOTE — Anesthesia Postprocedure Evaluation (Signed)
Anesthesia Post Note  Patient: Theresa Francis  Procedure(s) Performed: COLONOSCOPY WITH PROPOFOL (N/A )  Patient location during evaluation: Endoscopy Anesthesia Type: General Level of consciousness: awake and alert and oriented Pain management: pain level controlled Vital Signs Assessment: post-procedure vital signs reviewed and stable Respiratory status: spontaneous breathing, nonlabored ventilation and respiratory function stable Cardiovascular status: blood pressure returned to baseline and stable Postop Assessment: no signs of nausea or vomiting Anesthetic complications: no   No complications documented.   Last Vitals:  Vitals:   11/27/19 1228 11/27/19 1238  BP: (!) 123/51 (!) 131/57  Pulse: 76 66  Resp: 10 20  Temp:    SpO2: 97% 98%    Last Pain:  Vitals:   11/27/19 1238  TempSrc:   PainSc: 0-No pain                 Kamaal Cast

## 2019-11-28 ENCOUNTER — Encounter: Payer: Self-pay | Admitting: Gastroenterology

## 2019-11-28 LAB — SURGICAL PATHOLOGY

## 2019-12-06 ENCOUNTER — Telehealth: Payer: Self-pay

## 2019-12-06 NOTE — Telephone Encounter (Signed)
-----   Message from Virgel Manifold, MD sent at 12/05/2019  1:32 PM EDT ----- Herb Grays please let the patient know, the polyps removed from your colon were precancerous.  Please ensure patient has referral to [facility] for the polyp that was not removed.  Please ask her to call us if she does not hear from them in the next 1 to 2 weeks.  Please ask her to keep her appointment with [place] as well

## 2019-12-06 NOTE — Telephone Encounter (Signed)
Patient did not answer and voicemail was full. I will send her a MyChart message and I will also fax the referral to [facility] so they could contact her to schedule her colonoscopy for polyp removal.

## 2019-12-12 ENCOUNTER — Encounter: Payer: Self-pay | Admitting: Internal Medicine

## 2019-12-12 ENCOUNTER — Ambulatory Visit: Payer: Medicare PPO | Admitting: Internal Medicine

## 2019-12-12 ENCOUNTER — Other Ambulatory Visit: Payer: Self-pay

## 2019-12-12 ENCOUNTER — Ambulatory Visit
Admission: RE | Admit: 2019-12-12 | Discharge: 2019-12-12 | Disposition: A | Discharge status: Home / Self Care | Payer: Medicare PPO | Source: Ambulatory Visit | Attending: Internal Medicine | Admitting: Internal Medicine

## 2019-12-12 DIAGNOSIS — F439 Reaction to severe stress, unspecified: Secondary | ICD-10-CM | POA: Diagnosis not present

## 2019-12-12 DIAGNOSIS — R739 Hyperglycemia, unspecified: Secondary | ICD-10-CM

## 2019-12-12 DIAGNOSIS — Z8616 Personal history of COVID-19: Secondary | ICD-10-CM | POA: Diagnosis not present

## 2019-12-12 DIAGNOSIS — Z1231 Encounter for screening mammogram for malignant neoplasm of breast: Secondary | ICD-10-CM | POA: Insufficient documentation

## 2019-12-12 DIAGNOSIS — E7800 Pure hypercholesterolemia, unspecified: Secondary | ICD-10-CM | POA: Diagnosis not present

## 2019-12-12 NOTE — Progress Notes (Signed)
Patient ID: Theresa Francis, female   DOB: 1950-07-07, 69 y.o.   MRN: [id number]   Subjective:    Patient ID: Theresa Francis, female    DOB: 04/26/1950, 69 y.o.   MRN: [id number]  HPI This visit occurred during the SARS-CoV-2 public health emergency.  Safety protocols were in place, including screening questions prior to the visit, additional usage of staff PPE, and extensive cleaning of exam room while observing appropriate contact time as indicated for disinfecting solutions.  Patient here for a scheduled follow up.  Recently diagnosed with covid.  Feels better.  Energy better.  Some minimal dry cough, but overall no chest congestion, sob, chest pain or chest tightness.  Eating.  No nausea or vomiting.  Bowels moving.  Handling stress. Taking zoloft.     Past Medical History:  Diagnosis Date  . Abnormal Pap smear    s/p conization.   . Basal cell carcinoma    eye lid  . History of chicken pox   . History of shingles   . Osteoarthritis   . Urine incontinence    H/O occasional   Past Surgical History:  Procedure Laterality Date  . BREAST BIOPSY Left 11/03/2014   neg./ done by Dr. Bary Castilla in office  . BREAST CYST ASPIRATION Left   . COLONOSCOPY  2008   Dr Sonny Masters  . COLONOSCOPY WITH PROPOFOL N/A 11/27/2019   Procedure: COLONOSCOPY WITH PROPOFOL;  Surgeon: Virgel Manifold, MD;  Location: [facility] ENDOSCOPY;  Service: Endoscopy;  Laterality: N/A;  PATIENT COVID + on 9/03/202;   COPY ON CHART  . 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 FINE NEEDLE ASPIRATION Left 2010   Dr Bary Castilla  . TONSILLECTOMY  1967   Family History  Problem Relation Age of Onset  . Breast cancer Mother 37  . Heart disease Father   . Breast cancer Sister        angiosarcoma   Social History   Socioeconomic History  . Marital status: Married    Spouse name: Not on file  . Number of children: 3  . Years of education: Not on file  . Highest education level: Not on file  Occupational  History  . Not on file  Tobacco Use  . Smoking status: Never Smoker  . Smokeless tobacco: Never Used  Vaping Use  . Vaping Use: Never used  Substance and Sexual Activity  . Alcohol use: Yes    Alcohol/week: 0.0 standard drinks    Comment: drinks wine occasionally  . Drug use: No  . Sexual activity: Not on file  Other Topics Concern  . Not on file  Social History Narrative  . Not on file   Social Determinants of Health   Financial Resource Strain:   . Difficulty of Paying Living Expenses: Not on file  Food Insecurity:   . Worried About Charity fundraiser in the Last Year: Not on file  . Ran Out of Food in the Last Year: Not on file  Transportation Needs:   . Lack of Transportation (Medical): Not on file  . Lack of Transportation (Non-Medical): Not on file  Physical Activity:   . Days of Exercise per Week: Not on file  . Minutes of Exercise per Session: Not on file  Stress:   . Feeling of Stress : Not on file  Social Connections:   . Frequency of Communication with Friends and Family: Not on file  . Frequency of Social Gatherings with Friends  and Family: Not on file  . Attends Religious Services: Not on file  . Active Member of Clubs or Organizations: Not on file  . Attends Archivist Meetings: Not on file  . Marital Status: Not on file    Outpatient Encounter Medications as of 12/12/2019  Medication Sig  . acetaminophen (TYLENOL) 650 MG CR tablet Take 1,300 mg by mouth every 6 (six) hours as needed for pain.  . Calcium Carbonate-Vitamin D (CALTRATE 600+D PO) Take by mouth daily.  . Cholecalciferol (D3 SUPER STRENGTH) 2000 units CAPS Take 1 capsule by mouth daily.  . ondansetron (ZOFRAN) 4 MG tablet Take 1 tablet (4 mg total) by mouth 2 (two) times daily as needed for nausea or vomiting.  . rosuvastatin (CRESTOR) 5 MG tablet TAKE ONE TABLET BY MOUTH EACH MONDAY [facility] AND FRIDAY  . sertraline (ZOLOFT) 50 MG tablet Take 1 tablet (50 mg total) by mouth  daily.   No facility-administered encounter medications on file as of 12/12/2019.    Review of Systems  Constitutional: Negative for appetite change and unexpected weight change.  HENT: Negative for congestion and sinus pain.   Respiratory: Negative for chest tightness and shortness of breath.        No significant cough.   Cardiovascular: Negative for chest pain, palpitations and leg swelling.  Gastrointestinal: Negative for diarrhea, nausea and vomiting.  Genitourinary: Negative for difficulty urinating and dysuria.  Musculoskeletal: Negative for joint swelling and myalgias.  Skin: Negative for color change and rash.  Neurological: Negative for dizziness, light-headedness and headaches.  Psychiatric/Behavioral: Negative for agitation and dysphoric mood.       Objective:    Physical Exam Vitals reviewed.  Constitutional:      General: She is not in acute distress.    Appearance: Normal appearance.  HENT:     Head: Normocephalic and atraumatic.     Right Ear: External ear normal.     Left Ear: External ear normal.  Eyes:     General: No scleral icterus.       Right eye: No discharge.        Left eye: No discharge.     Conjunctiva/sclera: Conjunctivae normal.  Neck:     Thyroid: No thyromegaly.  Cardiovascular:     Rate and Rhythm: Normal rate and regular rhythm.  Pulmonary:     Effort: No respiratory distress.     Breath sounds: Normal breath sounds. No wheezing.  Abdominal:     General: Bowel sounds are normal.     Palpations: Abdomen is soft.     Tenderness: There is no abdominal tenderness.  Musculoskeletal:        General: No swelling or tenderness.     Cervical back: Neck supple. No tenderness.  Lymphadenopathy:     Cervical: No cervical adenopathy.  Skin:    Findings: No erythema or rash.  Neurological:     Mental Status: She is alert.  Psychiatric:        Mood and Affect: Mood normal.        Behavior: Behavior normal.     BP 126/60 (BP Location:  Left Arm, Patient Position: Sitting, Cuff Size: Normal)   Pulse 80   Temp 98.3 F (36.8 C) (Oral)   Ht _0  (1.626 m)   Wt 146 lb (66.2 kg)   SpO2 98%   BMI 25.06 kg/m  Wt Readings from Last 3 Encounters:  12/12/19 146 lb (66.2 kg)  11/27/19 145 lb (65.8 kg)  10/01/19  148 lb 12.8 oz (67.5 kg)     Lab Results  Component Value Date   WBC 5.7 03/15/2019   HGB 13.7 03/15/2019   HCT 41.4 03/15/2019   PLT 285.0 03/15/2019   GLUCOSE 105 (H) 10/01/2019   CHOL 179 10/01/2019   TRIG 79.0 10/01/2019   HDL 64.20 10/01/2019   LDLDIRECT 130.6 11/26/2012   LDLCALC 99 10/01/2019   ALT 22 09/27/2019   AST 24 09/27/2019   NA 140 10/01/2019   K 4.4 10/01/2019   CL 106 10/01/2019   CREATININE 0.76 10/01/2019   BUN 15 10/01/2019   CO2 25 10/01/2019   TSH 1.82 03/15/2019   HGBA1C 5.9 10/01/2019       Assessment & Plan:   Problem List Items Addressed This Visit    Stress    On zoloft.  Doing well.  Follow.       Hyperglycemia    Low carb diet and exercise.  Follow met b and a1c.       Relevant Orders   Hemoglobin A1c   Hypercholesterolemia    Low cholesterol diet and exercise.  On crestor.  Follow lipid panel and liver function tests.        Relevant Orders   CBC with Differential/Platelet   Hepatic function panel   Lipid panel   TSH   Basic metabolic panel   History of COVID-19    Doing better.  Feels better. Energy better.  No significant residual symptoms.  Follow.           Einar Pheasant, MD

## 2019-12-14 ENCOUNTER — Encounter: Payer: Self-pay | Admitting: Internal Medicine

## 2019-12-14 DIAGNOSIS — R739 Hyperglycemia, unspecified: Secondary | ICD-10-CM | POA: Insufficient documentation

## 2019-12-14 NOTE — Assessment & Plan Note (Signed)
Doing better.  Feels better. Energy better.  No significant residual symptoms.  Follow.

## 2019-12-14 NOTE — Assessment & Plan Note (Signed)
Low cholesterol diet and exercise.  On crestor.  Follow lipid panel and liver function tests.  

## 2019-12-14 NOTE — Assessment & Plan Note (Signed)
Low carb diet and exercise.  Follow met b and a1c.  

## 2019-12-14 NOTE — Assessment & Plan Note (Signed)
On zoloft.  Doing well.  Follow.

## 2019-12-23 ENCOUNTER — Encounter: Payer: Self-pay | Admitting: Gastroenterology

## 2019-12-24 ENCOUNTER — Other Ambulatory Visit: Payer: Self-pay | Admitting: Internal Medicine

## 2019-12-25 ENCOUNTER — Ambulatory Visit: Payer: Medicare PPO | Admitting: Gastroenterology

## 2019-12-26 DIAGNOSIS — K635 Polyp of colon: Secondary | ICD-10-CM | POA: Diagnosis not present

## 2019-12-26 DIAGNOSIS — D120 Benign neoplasm of cecum: Secondary | ICD-10-CM | POA: Diagnosis not present

## 2019-12-26 DIAGNOSIS — Z96643 Presence of artificial hip joint, bilateral: Secondary | ICD-10-CM | POA: Diagnosis not present

## 2019-12-26 DIAGNOSIS — E785 Hyperlipidemia, unspecified: Secondary | ICD-10-CM | POA: Diagnosis not present

## 2019-12-26 DIAGNOSIS — K640 First degree hemorrhoids: Secondary | ICD-10-CM | POA: Diagnosis not present

## 2019-12-26 DIAGNOSIS — D122 Benign neoplasm of ascending colon: Secondary | ICD-10-CM | POA: Diagnosis not present

## 2019-12-26 DIAGNOSIS — F32A Depression, unspecified: Secondary | ICD-10-CM | POA: Diagnosis not present

## 2019-12-26 DIAGNOSIS — Z79899 Other long term (current) drug therapy: Secondary | ICD-10-CM | POA: Diagnosis not present

## 2019-12-26 DIAGNOSIS — K5730 Diverticulosis of large intestine without perforation or abscess without bleeding: Secondary | ICD-10-CM | POA: Diagnosis not present

## 2020-01-07 ENCOUNTER — Ambulatory Visit: Payer: Medicare PPO | Admitting: Gastroenterology

## 2020-01-07 ENCOUNTER — Encounter: Payer: Self-pay | Admitting: Gastroenterology

## 2020-01-07 ENCOUNTER — Other Ambulatory Visit: Payer: Self-pay

## 2020-01-07 VITALS — BP 158/50 | HR 86 | Temp 98.1°F | Ht 64.0 in | Wt 145.0 lb

## 2020-01-07 DIAGNOSIS — K824 Cholesterolosis of gallbladder: Secondary | ICD-10-CM

## 2020-01-07 NOTE — Patient Instructions (Addendum)
We will send you a letter in a year to remind you that it's time to schedule a follow up appointment with Dr. Bonna Gains.   Please call [phone] to schedule your Ultrasound.

## 2020-01-08 ENCOUNTER — Encounter: Payer: Self-pay | Admitting: Internal Medicine

## 2020-01-08 DIAGNOSIS — K824 Cholesterolosis of gallbladder: Secondary | ICD-10-CM | POA: Insufficient documentation

## 2020-01-08 NOTE — Progress Notes (Signed)
Vonda Antigua, MD [address]  [place]  [place], [place] [address]  Main: [phone]  Fax: [phone]   Primary Care Physician: Einar Pheasant, MD   Chief Complaint  Patient presents with  . Rosanna Randy disease  Polyp follow-up  HPI: Theresa Francis is a 69 y.o. female who initially saw for screening colonoscopy in October 2021.  Procedure showed 3, 10 to 20 mm polyps in the ascending colon that were seen but not removed.  3 other polyps were seen, ranging from 3 to 7 mm in size in the descending and ascending colon.  These were removed.  Diverticulosis was also seen.  Patient was referred to [facility] for removal of the above ascending colon polyps.  Patient has since undergone colonoscopy at [facility] on December 26, 2019 with the following findings:  Impression:      - One 4 mm polyp in the cecum, removed with a cold             snare. Resected and retrieved.            - One 15 mm polyp in the proximal ascending colon,             removed with mucosal resection. Resected and retrieved.            - One 10 mm polyp in the distal ascending colon,             removed with mucosal resection. Resected and retrieved.            - One 5 mm polyp in the transverse colon, removed with             a cold snare. Resected and retrieved.            - Diverticulosis in the sigmoid colon.            - Non-bleeding internal hemorrhoids.            - Mucosal resection was performed. Resection and             retrieval were complete.            - Mucosal resection was performed. Resection and             retrieval were complete.  Pathology show sessile serrated lesions (adenoma), and hyperplastic polyps.  Repeat colonoscopy is recommended in 1 year  The patient denies abdominal or flank pain, anorexia, nausea or vomiting, dysphagia, change  in bowel habits or black or bloody stools or weight loss.  Prior to this, patient was seen by Dr. Allen Norris for elevated bilirubin.  His note states that patient has chronically elevated unconjugated bilirubin, consistent with Guilbert syndrome, with no elevation in direct bilirubin, unlikely related to liver.  As per his note "The patient's AST and ALT have also remained normal therefore her fatty liver is not causing any hepatitis at this time.  The patient has been told the diagnosis and that since there is not significant modifying factors such as treating cholesterol diabetes or obesity it is unlikely to have the fatty liver regressed.  She has also been told that since it is not causing any inflammation it is likely benign in her circumstances.  I have also told her that no further work-up needs to be done for her unconjugated hyperbilirubinemia."  Current Outpatient Medications  Medication Sig Dispense Refill  . acetaminophen (TYLENOL) 650 MG CR tablet Take 1,300 mg by mouth every 6 (six)  hours as needed for pain.    . Calcium Carbonate-Vitamin D (CALTRATE 600+D PO) Take by mouth daily.    . Cholecalciferol (D3 SUPER STRENGTH) 2000 units CAPS Take 1 capsule by mouth daily.    . rosuvastatin (CRESTOR) 5 MG tablet TAKE ONE TABLET BY MOUTH EACH MONDAY WEDNESDAY AND FRIDAY 30 tablet 1  . sertraline (ZOLOFT) 50 MG tablet TAKE 1 TABLET BY MOUTH DAILY 30 tablet 2  . ondansetron (ZOFRAN) 4 MG tablet Take 1 tablet (4 mg total) by mouth 2 (two) times daily as needed for nausea or vomiting. (Patient not taking: Reported on 01/07/2020) 15 tablet 0   No current facility-administered medications for this visit.    Allergies as of 01/07/2020  . (No Known Allergies)    ROS:  General: Negative for anorexia, weight loss, fever, chills, fatigue, weakness. ENT: Negative for hoarseness, difficulty swallowing , nasal congestion. CV: Negative for chest pain, angina, palpitations, dyspnea on exertion, peripheral  edema.  Respiratory: Negative for dyspnea at rest, dyspnea on exertion, cough, sputum, wheezing.  GI: See history of present illness. GU:  Negative for dysuria, hematuria, urinary incontinence, urinary frequency, nocturnal urination.  Endo: Negative for unusual weight change.    Physical Examination:   BP (!) 158/50   Pulse 86   Temp 98.1 F (36.7 C) (Oral)   Ht 5\' 4"  (1.626 m)   Wt 145 lb (65.8 kg)   BMI 24.89 kg/m   General: Well-nourished, well-developed in no acute distress.  Eyes: No icterus. Conjunctivae pink. Mouth: Oropharyngeal mucosa moist and pink , no lesions erythema or exudate. Neck: Supple, Trachea midline Abdomen: Bowel sounds are normal, nontender, nondistended, no hepatosplenomegaly or masses, no abdominal bruits or hernia , no rebound or guarding.   Extremities: No lower extremity edema. No clubbing or deformities. Neuro: Alert and oriented x 3.  Grossly intact. Skin: Warm and dry, no jaundice.   Psych: Alert and cooperative, normal mood and affect.   Labs: CMP     Component Value Date/Time   NA 140 10/01/2019 1022   K 4.4 10/01/2019 1022   CL 106 10/01/2019 1022   CO2 25 10/01/2019 1022   GLUCOSE 105 (H) 10/01/2019 1022   BUN 15 10/01/2019 1022   CREATININE 0.76 10/01/2019 1022   CALCIUM 9.5 10/01/2019 1022   PROT 6.8 09/27/2019 0939   ALBUMIN 4.5 09/27/2019 0939   AST 24 09/27/2019 0939   ALT 22 09/27/2019 0939   ALKPHOS 75 09/27/2019 0939   BILITOT 1.3 (H) 09/27/2019 0939   Lab Results  Component Value Date   WBC 5.7 03/15/2019   HGB 13.7 03/15/2019   HCT 41.4 03/15/2019   MCV 87.8 03/15/2019   PLT 285.0 03/15/2019    Imaging Studies: MM 3D SCREEN BREAST BILATERAL  Result Date: 12/15/2019 CLINICAL DATA:  Screening. EXAM: DIGITAL SCREENING BILATERAL MAMMOGRAM WITH TOMO AND CAD COMPARISON:  Previous exam(s). ACR Breast Density Category b: There are scattered areas of fibroglandular density. FINDINGS: There are no findings suspicious for  malignancy. Images were processed with CAD. IMPRESSION: No mammographic evidence of malignancy. A result letter of this screening mammogram will be mailed directly to the patient. RECOMMENDATION: Screening mammogram in one year. (Code:SM-B-01Y) BI-RADS CATEGORY  1: Negative. Electronically Signed   By: Ammie Ferrier M.D.   On: 12/15/2019 07:11    Assessment and Plan:   Theresa Francis is a 69 y.o. y/o female with history of advanced polyps, and unconjugated hyperbilirubinemia here for follow-up  Patient has completed her  colonoscopy for advanced polyps at [facility] in November 2021.  They have recommended a 1 year follow-up.  I recommended that she have her surveillance colonoscopy in 1 year at [facility] as well and she is agreeable to this plan.  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  No further work-up needed for this.  However, I did notice that her ultrasound showed a questionable gallbladder polyp previously.  I will repeat her ultrasound in this regard at this time as well    Dr Vonda Antigua

## 2020-03-23 ENCOUNTER — Other Ambulatory Visit: Payer: Self-pay | Admitting: Internal Medicine

## 2020-04-16 ENCOUNTER — Other Ambulatory Visit (INDEPENDENT_AMBULATORY_CARE_PROVIDER_SITE_OTHER): Payer: Medicare PPO

## 2020-04-16 ENCOUNTER — Other Ambulatory Visit: Payer: Self-pay

## 2020-04-16 ENCOUNTER — Telehealth: Payer: Self-pay

## 2020-04-16 DIAGNOSIS — E7800 Pure hypercholesterolemia, unspecified: Secondary | ICD-10-CM | POA: Diagnosis not present

## 2020-04-16 DIAGNOSIS — R739 Hyperglycemia, unspecified: Secondary | ICD-10-CM | POA: Diagnosis not present

## 2020-04-16 DIAGNOSIS — F439 Reaction to severe stress, unspecified: Secondary | ICD-10-CM

## 2020-04-16 DIAGNOSIS — Z713 Dietary counseling and surveillance: Secondary | ICD-10-CM

## 2020-04-16 LAB — CBC WITH DIFFERENTIAL/PLATELET
Basophils Absolute: 0 10*3/uL (ref 0.0–0.1)
Basophils Relative: 0.6 % (ref 0.0–3.0)
Eosinophils Absolute: 0.1 10*3/uL (ref 0.0–0.7)
Eosinophils Relative: 1.6 % (ref 0.0–5.0)
HCT: 41 % (ref 36.0–46.0)
Hemoglobin: 13.6 g/dL (ref 12.0–15.0)
Lymphocytes Relative: 43.1 % (ref 12.0–46.0)
Lymphs Abs: 2.2 10*3/uL (ref 0.7–4.0)
MCHC: 33.1 g/dL (ref 30.0–36.0)
MCV: 85.9 fl (ref 78.0–100.0)
Monocytes Absolute: 0.4 10*3/uL (ref 0.1–1.0)
Monocytes Relative: 8.7 % (ref 3.0–12.0)
Neutro Abs: 2.4 10*3/uL (ref 1.4–7.7)
Neutrophils Relative %: 46 % (ref 43.0–77.0)
Platelets: 274 10*3/uL (ref 150.0–400.0)
RBC: 4.77 Mil/uL (ref 3.87–5.11)
RDW: 13.6 % (ref 11.5–15.5)
WBC: 5.1 10*3/uL (ref 4.0–10.5)

## 2020-04-16 LAB — LIPID PANEL
Cholesterol: 150 mg/dL (ref 0–200)
HDL: 50.4 mg/dL (ref >39.00)
LDL Cholesterol: 86 mg/dL (ref 0–99)
NonHDL: 99.65
Total CHOL/HDL Ratio: 3
Triglycerides: 70 mg/dL (ref 0.0–149.0)
VLDL: 14 mg/dL (ref 0.0–40.0)

## 2020-04-16 LAB — BASIC METABOLIC PANEL
BUN: 14 mg/dL (ref 6–23)
CO2: 28 mEq/L (ref 19–32)
Calcium: 9.8 mg/dL (ref 8.4–10.5)
Chloride: 104 mEq/L (ref 96–112)
Creatinine, Ser: 0.74 mg/dL (ref 0.40–1.20)
GFR: 81.95 mL/min (ref >60.00)
Glucose, Bld: 100 mg/dL — ABNORMAL HIGH (ref 70–99)
Potassium: 4.5 mEq/L (ref 3.5–5.1)
Sodium: 140 mEq/L (ref 135–145)

## 2020-04-16 LAB — VITAMIN B12: Vitamin B-12: 290 pg/mL (ref 211–911)

## 2020-04-16 LAB — HEPATIC FUNCTION PANEL
ALT: 16 U/L (ref 0–35)
AST: 19 U/L (ref 0–37)
Albumin: 4.7 g/dL (ref 3.5–5.2)
Alkaline Phosphatase: 72 U/L (ref 39–117)
Bilirubin, Direct: 0.2 mg/dL (ref 0.0–0.3)
Total Bilirubin: 1.3 mg/dL — ABNORMAL HIGH (ref 0.2–1.2)
Total Protein: 7.2 g/dL (ref 6.0–8.3)

## 2020-04-16 LAB — TSH: TSH: 1 u[IU]/mL (ref 0.35–4.50)

## 2020-04-16 LAB — HEMOGLOBIN A1C: Hgb A1c MFr Bld: 5.8 % (ref 4.6–6.5)

## 2020-04-16 NOTE — Telephone Encounter (Signed)
Let me know if something more needs to be done.

## 2020-04-16 NOTE — Telephone Encounter (Signed)
Spoke with Theresa Francis this morning and she wanted to add a B12 lab. Patient stateed that she really wants the blood work and I  advised that we could add the B12 lab but her insurance may or may not pay for it. Patient stated that she was ok with paying out of pocket and wanted the B12 lab.

## 2020-04-16 NOTE — Addendum Note (Signed)
Addended by: Lars Masson on: 04/16/2020 09:55 AM   Modules accepted: Orders

## 2020-04-16 NOTE — Addendum Note (Signed)
Addended by: Tor Netters I on: 04/16/2020 09:58 AM   Modules accepted: Orders

## 2020-04-17 NOTE — Telephone Encounter (Signed)
Spoke with vanessa. Nothing further needs to be done.

## 2020-04-20 ENCOUNTER — Other Ambulatory Visit: Payer: Self-pay

## 2020-04-20 ENCOUNTER — Encounter: Payer: Self-pay | Admitting: Internal Medicine

## 2020-04-20 ENCOUNTER — Ambulatory Visit (INDEPENDENT_AMBULATORY_CARE_PROVIDER_SITE_OTHER): Payer: Medicare PPO | Admitting: Internal Medicine

## 2020-04-20 VITALS — BP 130/78 | HR 80 | Temp 98.3°F | Resp 16 | Ht 64.0 in | Wt 138.0 lb

## 2020-04-20 DIAGNOSIS — Z0000 Encounter for general adult medical examination without abnormal findings: Secondary | ICD-10-CM

## 2020-04-20 DIAGNOSIS — D485 Neoplasm of uncertain behavior of skin: Secondary | ICD-10-CM | POA: Diagnosis not present

## 2020-04-20 DIAGNOSIS — R7989 Other specified abnormal findings of blood chemistry: Secondary | ICD-10-CM

## 2020-04-20 DIAGNOSIS — R739 Hyperglycemia, unspecified: Secondary | ICD-10-CM | POA: Diagnosis not present

## 2020-04-20 DIAGNOSIS — E538 Deficiency of other specified B group vitamins: Secondary | ICD-10-CM

## 2020-04-20 DIAGNOSIS — E7800 Pure hypercholesterolemia, unspecified: Secondary | ICD-10-CM

## 2020-04-20 DIAGNOSIS — L821 Other seborrheic keratosis: Secondary | ICD-10-CM | POA: Diagnosis not present

## 2020-04-20 DIAGNOSIS — I10 Essential (primary) hypertension: Secondary | ICD-10-CM | POA: Diagnosis not present

## 2020-04-20 DIAGNOSIS — R945 Abnormal results of liver function studies: Secondary | ICD-10-CM | POA: Diagnosis not present

## 2020-04-20 DIAGNOSIS — F439 Reaction to severe stress, unspecified: Secondary | ICD-10-CM

## 2020-04-20 DIAGNOSIS — Z85828 Personal history of other malignant neoplasm of skin: Secondary | ICD-10-CM | POA: Diagnosis not present

## 2020-04-20 DIAGNOSIS — D2262 Melanocytic nevi of left upper limb, including shoulder: Secondary | ICD-10-CM | POA: Diagnosis not present

## 2020-04-20 DIAGNOSIS — D2261 Melanocytic nevi of right upper limb, including shoulder: Secondary | ICD-10-CM | POA: Diagnosis not present

## 2020-04-20 DIAGNOSIS — K824 Cholesterolosis of gallbladder: Secondary | ICD-10-CM

## 2020-04-20 DIAGNOSIS — L718 Other rosacea: Secondary | ICD-10-CM | POA: Diagnosis not present

## 2020-04-20 DIAGNOSIS — L439 Lichen planus, unspecified: Secondary | ICD-10-CM | POA: Diagnosis not present

## 2020-04-20 DIAGNOSIS — D2271 Melanocytic nevi of right lower limb, including hip: Secondary | ICD-10-CM | POA: Diagnosis not present

## 2020-04-20 MED ORDER — TELMISARTAN 20 MG PO TABS: 20.0000 mg | ORAL_TABLET | Freq: Every day | ORAL | 2 refills | Status: DC

## 2020-04-20 NOTE — Progress Notes (Signed)
Patient ID: FRANKYE SCHWEGEL, female   DOB: 07/11/1950, 70 y.o.   MRN: [id number]   Subjective:    Patient ID: [name], female    DOB: 10-Dec-1950, 70 y.o.   MRN: [id number]  HPI This visit occurred during the SARS-CoV-2 public health emergency.  Safety protocols were in place, including screening questions prior to the visit, additional usage of staff PPE, and extensive cleaning of exam room while observing appropriate contact time as indicated for disinfecting solutions.  Patient here for her physical exam.  She is doing well. Has started nutrisystem with her husband.  Eating differently.  Feels better.  No chest pain or sob with increased activity or exertion.  No acid reflux or abdominal pain reported.  Bowels moving well.  Had colonoscopy with multiple polyps.  Had f/u colonoscopy at [facility] 12/2019.  Recommended f/u in one year at [facility].  Blood pressure averaging 157-262 systolic readings on outside checks.    Past Medical History:  Diagnosis Date  . Abnormal Pap smear    s/p conization.   . Basal cell carcinoma    eye lid  . History of chicken pox   . History of shingles   . Osteoarthritis   . Urine incontinence    H/O occasional   Past Surgical History:  Procedure Laterality Date  . BREAST BIOPSY Left 11/03/2014   neg./ done by Dr. Bary Castilla in office  . BREAST CYST ASPIRATION Left   . COLONOSCOPY  2008   Dr Sonny Masters  . COLONOSCOPY WITH PROPOFOL N/A 11/27/2019   Procedure: COLONOSCOPY WITH PROPOFOL;  Surgeon: Virgel Manifold, MD;  Location: [facility] ENDOSCOPY;  Service: Endoscopy;  Laterality: N/A;  PATIENT COVID + on 9/03/202;   COPY ON CHART  . 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 FINE NEEDLE ASPIRATION Left 2010   Dr Bary Castilla  . TONSILLECTOMY  1967   Family History  Problem Relation Age of Onset  . Breast cancer Mother 59  . Heart disease Father   . Breast cancer Sister        angiosarcoma   Social History   Socioeconomic History  . Marital  status: Married    Spouse name: Not on file  . Number of children: 3  . Years of education: Not on file  . Highest education level: Not on file  Occupational History  . Not on file  Tobacco Use  . Smoking status: Never Smoker  . Smokeless tobacco: Never Used  Vaping Use  . Vaping Use: Never used  Substance and Sexual Activity  . Alcohol use: Yes    Alcohol/week: 0.0 standard drinks    Comment: drinks wine occasionally  . Drug use: No  . Sexual activity: Not on file  Other Topics Concern  . Not on file  Social History Narrative  . Not on file   Social Determinants of Health   Financial Resource Strain: Not on file  Food Insecurity: Not on file  Transportation Needs: Not on file  Physical Activity: Not on file  Stress: Not on file  Social Connections: Not on file    Outpatient Encounter Medications as of 04/20/2020  Medication Sig  . telmisartan (MICARDIS) 20 MG tablet Take 1 tablet (20 mg total) by mouth daily.  Marland Kitchen acetaminophen (TYLENOL) 650 MG CR tablet Take 1,300 mg by mouth every 6 (six) hours as needed for pain.  . Calcium Carbonate-Vitamin D (CALTRATE 600+D PO) Take by mouth daily.  . Cholecalciferol (D3 SUPER  STRENGTH) 2000 units CAPS Take 1 capsule by mouth daily.  . ondansetron (ZOFRAN) 4 MG tablet Take 1 tablet (4 mg total) by mouth 2 (two) times daily as needed for nausea or vomiting. (Patient not taking: Reported on 01/07/2020)  . rosuvastatin (CRESTOR) 5 MG tablet TAKE ONE TABLET BY MOUTH EACH MONDAY [facility] AND FRIDAY  . sertraline (ZOLOFT) 50 MG tablet TAKE 1 TABLET BY MOUTH DAILY   No facility-administered encounter medications on file as of 04/20/2020.    Review of Systems  Constitutional: Negative for appetite change and unexpected weight change.  HENT: Negative for congestion, sinus pressure and sore throat.   Eyes: Negative for pain and visual disturbance.  Respiratory: Negative for cough, chest tightness and shortness of breath.   Cardiovascular:  Negative for chest pain, palpitations and leg swelling.  Gastrointestinal: Negative for abdominal pain, diarrhea, nausea and vomiting.  Genitourinary: Negative for difficulty urinating and dysuria.  Musculoskeletal: Negative for joint swelling and myalgias.  Skin: Negative for color change and rash.  Neurological: Negative for dizziness, light-headedness and headaches.  Hematological: Negative for adenopathy. Does not bruise/bleed easily.  Psychiatric/Behavioral: Negative for agitation and dysphoric mood.       Objective:    Physical Exam Vitals reviewed.  Constitutional:      General: She is not in acute distress.    Appearance: Normal appearance. She is well-developed and well-nourished.  HENT:     Head: Normocephalic and atraumatic.     Right Ear: External ear normal.     Left Ear: External ear normal.     Mouth/Throat:     Mouth: Oropharynx is clear and moist.  Eyes:     General: No scleral icterus.       Right eye: No discharge.        Left eye: No discharge.     Conjunctiva/sclera: Conjunctivae normal.  Neck:     Thyroid: No thyromegaly.  Cardiovascular:     Rate and Rhythm: Normal rate and regular rhythm.  Pulmonary:     Effort: No tachypnea, accessory muscle usage or respiratory distress.     Breath sounds: Normal breath sounds. No decreased breath sounds or wheezing.  Chest:  Breasts:     Right: No inverted nipple, mass, nipple discharge or tenderness (no axillary adenopathy).     Left: No inverted nipple, mass, nipple discharge or tenderness (no axilarry adenopathy).    Abdominal:     General: Bowel sounds are normal.     Palpations: Abdomen is soft.     Tenderness: There is no abdominal tenderness.  Musculoskeletal:        General: No swelling, tenderness or edema.     Cervical back: Neck supple. No tenderness.  Lymphadenopathy:     Cervical: No cervical adenopathy.  Skin:    Findings: No erythema or rash.  Neurological:     Mental Status: She is alert  and oriented to person, place, and time.  Psychiatric:        Mood and Affect: Mood and affect and mood normal.        Behavior: Behavior normal.     BP 130/78   Pulse 80   Temp 98.3 F (36.8 C) (Oral)   Resp 16   Ht 5' 4" (1.626 m)   Wt 138 lb (62.6 kg)   SpO2 98%   BMI 23.69 kg/m  Wt Readings from Last 3 Encounters:  04/20/20 138 lb (62.6 kg)  01/07/20 145 lb (65.8 kg)  12/12/19 146 lb (66.2  kg)     Lab Results  Component Value Date   WBC 5.1 04/16/2020   HGB 13.6 04/16/2020   HCT 41.0 04/16/2020   PLT 274.0 04/16/2020   GLUCOSE 100 (H) 04/16/2020   CHOL 150 04/16/2020   TRIG 70.0 04/16/2020   HDL 50.40 04/16/2020   LDLDIRECT 130.6 11/26/2012   LDLCALC 86 04/16/2020   ALT 16 04/16/2020   AST 19 04/16/2020   NA 140 04/16/2020   K 4.5 04/16/2020   CL 104 04/16/2020   CREATININE 0.74 04/16/2020   BUN 14 04/16/2020   CO2 28 04/16/2020   TSH 1.00 04/16/2020   HGBA1C 5.8 04/16/2020    MM 3D SCREEN BREAST BILATERAL  Result Date: 12/15/2019 CLINICAL DATA:  Screening. EXAM: DIGITAL SCREENING BILATERAL MAMMOGRAM WITH TOMO AND CAD COMPARISON:  Previous exam(s). ACR Breast Density Category b: There are scattered areas of fibroglandular density. FINDINGS: There are no findings suspicious for malignancy. Images were processed with CAD. IMPRESSION: No mammographic evidence of malignancy. A result letter of this screening mammogram will be mailed directly to the patient. RECOMMENDATION: Screening mammogram in one year. (Code:SM-B-01Y) BI-RADS CATEGORY  1: Negative. Electronically Signed   By: Ammie Ferrier M.D.   On: 12/15/2019 07:11       Assessment & Plan:   Problem List Items Addressed This Visit    Abnormal liver function tests    Slightly elevated bilirubin - probably Gilberts.  Ultrasound as outlined previously.  Saw GI.  No further w/up warranted.  Follow liver function tests.        B12 deficiency    Start oral B12 190mg q day.  Follow.        Gallbladder polyp    Was seen on previous abdominal ultrasound.  Has seen GI.  Per note, planned for f/u abdominal ultrasound.  Was ordered.  Contact GI regarding f/u.       Health care maintenance    Physical today 04/20/20.  PAP 05/01/16 - negative with negative HPV (low cellularity).  cologuard - 04/2016 - negative.  Colonoscopy 11/27/19 - [facility].  Mammogram 12/12/19 - [facility].       Hyperbilirubinemia    Very slight elevation.  Stable.  Follow liver panel.  Probably Gilberts ( normal variant).        Hypercholesterolemia    On crestor.  Has adjusted diet.  Lost weight.  Follow lipid panel and liver function tests.   Lab Results  Component Value Date   CHOL 150 04/16/2020   HDL 50.40 04/16/2020   LDLCALC 86 04/16/2020   LDLDIRECT 130.6 11/26/2012   TRIG 70.0 04/16/2020   CHOLHDL 3 04/16/2020        Relevant Medications   telmisartan (MICARDIS) 20 MG tablet   Hyperglycemia    Low carb diet and exercise.  Follow met b and a1c.       Hypertension    Blood pressure remaining in the upper 130s-140 range.  Start micardis.  Schedule f/u met b in 10-14 days.        Relevant Medications   telmisartan (MICARDIS) 20 MG tablet   Other Relevant Orders   Basic metabolic panel   Stress    On zoloft.  Feels better.  Does not feel needs any further intervention.  Follow.         Other Visit Diagnoses    Routine general medical examination at a health care facility    -  Primary       Charlene  Nicki Reaper, MD

## 2020-04-20 NOTE — Assessment & Plan Note (Addendum)
Blood pressure remaining in the upper 130s-140 range.  Start micardis.  Schedule f/u met b in 10-14 days.

## 2020-04-20 NOTE — Assessment & Plan Note (Addendum)
Physical today 04/20/20.  PAP 05/01/16 - negative with negative HPV (low cellularity).  cologuard - 04/2016 - negative.  Colonoscopy 11/27/19 - [facility].  Mammogram 12/12/19 - [facility].

## 2020-04-20 NOTE — Patient Instructions (Signed)
Oral vitamin B12 1055mcg per day.

## 2020-04-26 ENCOUNTER — Encounter: Payer: Self-pay | Admitting: Internal Medicine

## 2020-04-26 DIAGNOSIS — E538 Deficiency of other specified B group vitamins: Secondary | ICD-10-CM | POA: Insufficient documentation

## 2020-04-26 NOTE — Assessment & Plan Note (Signed)
Very slight elevation.  Stable.  Follow liver panel.  Probably Gilberts ( normal variant).

## 2020-04-26 NOTE — Assessment & Plan Note (Signed)
Start oral B12 120mcg q day.  Follow.

## 2020-04-26 NOTE — Assessment & Plan Note (Signed)
Low carb diet and exercise.  Follow met b and a1c.  

## 2020-04-26 NOTE — Assessment & Plan Note (Signed)
On crestor.  Has adjusted diet.  Lost weight.  Follow lipid panel and liver function tests.   Lab Results  Component Value Date   CHOL 150 04/16/2020   HDL 50.40 04/16/2020   LDLCALC 86 04/16/2020   LDLDIRECT 130.6 11/26/2012   TRIG 70.0 04/16/2020   CHOLHDL 3 04/16/2020

## 2020-04-26 NOTE — Assessment & Plan Note (Signed)
On zoloft.  Feels better.  Does not feel needs any further intervention.  Follow.

## 2020-04-26 NOTE — Assessment & Plan Note (Addendum)
Was seen on previous abdominal ultrasound.  Has seen GI.  Per note, planned for f/u abdominal ultrasound.  Was ordered.  Contact GI regarding f/u.

## 2020-04-26 NOTE — Assessment & Plan Note (Signed)
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  Ultrasound as outlined previously.  Saw GI.  No further w/up warranted.  Follow liver function tests.

## 2020-05-01 ENCOUNTER — Encounter: Payer: Self-pay | Admitting: Internal Medicine

## 2020-05-01 NOTE — Telephone Encounter (Signed)
Ok.  See message.

## 2020-05-02 NOTE — Telephone Encounter (Signed)
-----   Message from Virgel Manifold, MD sent at 04/28/2020  8:58 AM EST ----- Regarding: RE: question Thanks for following up. Maritza we had ordered U/S on her last visit. Can you call and set this up and have her follow up in clinic after that please. Thank you  ----- Message ----- From: Einar Pheasant, MD Sent: 04/26/2020   9:01 AM EST To: Virgel Manifold, MD Subject: question                                       I recently saw Ms Lares for f/u.  She is doing well.  Has adjusted her diet.  Lost some weight.  Feels better.  In reviewing her chart, she had a previous abdominal ultrasound that revealed a gallbladder polyp.  You had mentioned in your last note about scheduling a follow up ultrasound to reevaluate.  This was ordered, but it appears she did not have this test.  I just wanted to follow up and see if you need me to f/u with her about scheduling or do you want me to have her call your office.  Whatever you prefer.  Thank you for seeing her and taking care of her.  I appreciate your help.    Thank you again.  Mehtaab Mayeda

## 2020-05-05 NOTE — Telephone Encounter (Signed)
Scheduled

## 2020-05-06 ENCOUNTER — Telehealth: Payer: Self-pay

## 2020-05-06 NOTE — Telephone Encounter (Signed)
Called central scheduling and asked to schedule patient's ultrasound. I then called patient and couldn't leave a voicemail. I will send her a MyChart message letting her know that Dr. Bonna Gains would want for her to have this ultrasound done.

## 2020-05-06 NOTE — Telephone Encounter (Signed)
-----   Message from Virgel Manifold, MD sent at 04/28/2020  8:58 AM EST ----- Regarding: RE: question Thanks for following up. Theresa Francis we had ordered U/S on her last visit. Can you call and set this up and have her follow up in clinic after that please. Thank you  ----- Message ----- From: Einar Pheasant, MD Sent: 04/26/2020   9:01 AM EST To: Virgel Manifold, MD Subject: question                                       I recently saw Theresa Francis for f/u.  She is doing well.  Has adjusted her diet.  Lost some weight.  Feels better.  In reviewing her chart, she had a previous abdominal ultrasound that revealed a gallbladder polyp.  You had mentioned in your last note about scheduling a follow up ultrasound to reevaluate.  This was ordered, but it appears she did not have this test.  I just wanted to follow up and see if you need me to f/u with her about scheduling or do you want me to have her call your office.  Whatever you prefer.  Thank you for seeing her and taking care of her.  I appreciate your help.    Thank you again.  Charlene

## 2020-05-25 ENCOUNTER — Ambulatory Visit: Payer: Medicare PPO

## 2020-05-27 ENCOUNTER — Other Ambulatory Visit: Payer: Self-pay | Admitting: Internal Medicine

## 2020-05-27 ENCOUNTER — Ambulatory Visit: Payer: Medicare PPO | Admitting: Gastroenterology

## 2020-05-28 ENCOUNTER — Other Ambulatory Visit: Payer: Self-pay

## 2020-05-28 ENCOUNTER — Ambulatory Visit
Admission: RE | Admit: 2020-05-28 | Discharge: 2020-05-28 | Disposition: A | Discharge status: Home / Self Care | Payer: Medicare PPO | Source: Ambulatory Visit | Attending: Gastroenterology | Admitting: Gastroenterology

## 2020-05-28 DIAGNOSIS — K824 Cholesterolosis of gallbladder: Secondary | ICD-10-CM | POA: Diagnosis not present

## 2020-05-28 NOTE — Telephone Encounter (Signed)
I do not mind refilling, but need to clarify - is this just to have if needed or is she sick and having problems.

## 2020-05-29 NOTE — Telephone Encounter (Signed)
Sent in rx. Patient is going to be traveling and needs to have on hand.

## 2020-06-04 ENCOUNTER — Encounter: Payer: Self-pay | Admitting: Gastroenterology

## 2020-06-04 ENCOUNTER — Other Ambulatory Visit: Payer: Self-pay

## 2020-06-04 ENCOUNTER — Ambulatory Visit: Payer: Medicare PPO | Admitting: Gastroenterology

## 2020-06-04 VITALS — BP 146/76 | HR 73 | Temp 98.5°F | Ht 64.0 in | Wt 137.6 lb

## 2020-06-04 DIAGNOSIS — K824 Cholesterolosis of gallbladder: Secondary | ICD-10-CM | POA: Diagnosis not present

## 2020-06-04 NOTE — Progress Notes (Signed)
Vonda Antigua, MD [address]  [place]  [place], [place] [address]  Main: [phone]  Fax: [phone]   Primary Care Physician: Einar Pheasant, MD   Chief complaint: Gallbladder polyp  HPI: Theresa Francis is a 70 y.o. female here for follow-up of gallbladder polyps. The patient denies abdominal or flank pain, anorexia, vomiting, dysphagia, change in bowel habits or black or bloody stools or weight loss.  Patient 2018, patient with unchanged 4 mm gallbladder polyp, as compared to ultrasound from 2014 and 2020 by radiologist.  Second polyp measuring 3 mm was also seen.  No other acute findings noted.  Previous history: initially seen for screening colonoscopy in October 2021.  Procedure showed 3, 10 to 20 mm polyps in the ascending colon that were seen but not removed.  3 other polyps were seen, ranging from 3 to 7 mm in size in the descending and ascending colon.  These were removed.  Diverticulosis was also seen.  Patient was referred to [facility] for removal of the above ascending colon polyps.  Patient has since undergone colonoscopy at [facility] on December 26, 2019 with the following findings:  Impression:      - One 4 mm polyp in the cecum, removed with a cold             snare. Resected and retrieved.            - One 15 mm polyp in the proximal ascending colon,             removed with mucosal resection. Resected and retrieved.            - One 10 mm polyp in the distal ascending colon,             removed with mucosal resection. Resected and retrieved.            - One 5 mm polyp in the transverse colon, removed with             a cold snare. Resected and retrieved.            - Diverticulosis in the sigmoid colon.            - Non-bleeding internal hemorrhoids.            - Mucosal resection was performed. Resection and              retrieval were complete.            - Mucosal resection was performed. Resection and             retrieval were complete.  Pathology show sessile serrated lesions (adenoma), and hyperplastic polyps.  Repeat colonoscopy is recommended in 1 year  The patient denies abdominal or flank pain, anorexia, nausea or vomiting, dysphagia, change in bowel habits or black or bloody stools or weight loss.  Prior to this, patient was seen by Theresa. Allen Norris for elevated bilirubin.  His note states that patient has chronically elevated unconjugated bilirubin, consistent with Guilbert syndrome, with no elevation in direct bilirubin, unlikely related to liver.  As per his note "The patient's AST and ALT have also remained normal therefore her fatty liver is not causing any hepatitis at this time. The patient has been told the diagnosis and that since there is not significant modifying factors such as treating cholesterol diabetes or obesity it is unlikely to have the fatty liver regressed. She has also been told that since it is  not causing any inflammation it is likely benign in her circumstances. I have also told her that no further work-up needs to be done for her unconjugated hyperbilirubinemia."   Current Outpatient Medications  Medication Sig Dispense Refill  . acetaminophen (TYLENOL) 650 MG CR tablet Take 1,300 mg by mouth every 6 (six) hours as needed for pain.    . Calcium Carbonate-Vitamin D (CALTRATE 600+D PO) Take by mouth daily.    . Cholecalciferol 50 MCG (2000 UT) CAPS Take 1 capsule by mouth daily.    . ondansetron (ZOFRAN) 4 MG tablet TAKE ONE TABLET BY MOUTH TWICE DAILY AS NEEDED FOR NAUSEA OR VOMITING 15 tablet 0  . rosuvastatin (CRESTOR) 5 MG tablet TAKE ONE TABLET BY MOUTH EACH MONDAY WEDNESDAY AND FRIDAY 30 tablet 1  . sertraline (ZOLOFT) 50 MG tablet TAKE 1 TABLET BY MOUTH DAILY 30 tablet 2  . telmisartan (MICARDIS) 20 MG tablet Take 1 tablet (20 mg total) by mouth daily.  30 tablet 2  . vitamin B-12 (CYANOCOBALAMIN) 100 MCG tablet Take 100 mcg by mouth daily.     No current facility-administered medications for this visit.    Allergies as of 06/04/2020  . (No Known Allergies)    ROS:  General: Negative for anorexia, weight loss, fever, chills, fatigue, weakness. ENT: Negative for hoarseness, difficulty swallowing , nasal congestion. CV: Negative for chest pain, angina, palpitations, dyspnea on exertion, peripheral edema.  Respiratory: Negative for dyspnea at rest, dyspnea on exertion, cough, sputum, wheezing.  GI: See history of present illness. GU:  Negative for dysuria, hematuria, urinary incontinence, urinary frequency, nocturnal urination.  Endo: Negative for unusual weight change.    Physical Examination:   BP (!) 146/76   Pulse 73   Temp 98.5 F (36.9 C) (Oral)   Ht 5\' 4"  (1.626 m)   Wt 137 lb 9.6 oz (62.4 kg)   BMI 23.62 kg/m   General: Well-nourished, well-developed in no acute distress.  Eyes: No icterus. Conjunctivae pink. Mouth: Oropharyngeal mucosa moist and pink , no lesions erythema or exudate. Neck: Supple, Trachea midline Abdomen: Bowel sounds are normal, nontender, nondistended, no hepatosplenomegaly or masses, no abdominal bruits or hernia , no rebound or guarding.   Extremities: No lower extremity edema. No clubbing or deformities. Neuro: Alert and oriented x 3.  Grossly intact. Skin: Warm and dry, no jaundice.   Psych: Alert and cooperative, normal mood and affect.   Labs: CMP     Component Value Date/Time   NA 140 04/16/2020 0838   K 4.5 04/16/2020 0838   CL 104 04/16/2020 0838   CO2 28 04/16/2020 0838   GLUCOSE 100 (H) 04/16/2020 0838   BUN 14 04/16/2020 0838   CREATININE 0.74 04/16/2020 0838   CALCIUM 9.8 04/16/2020 0838   PROT 7.2 04/16/2020 0838   ALBUMIN 4.7 04/16/2020 0838   AST 19 04/16/2020 0838   ALT 16 04/16/2020 0838   ALKPHOS 72 04/16/2020 0838   BILITOT 1.3 (H) 04/16/2020 0838   Lab Results   Component Value Date   WBC 5.1 04/16/2020   HGB 13.6 04/16/2020   HCT 41.0 04/16/2020   MCV 85.9 04/16/2020   PLT 274.0 04/16/2020    Imaging Studies: US ABDOMEN LIMITED RUQ (LIVER/GB)  Result Date: 05/29/2020 CLINICAL DATA:  Gallbladder polyp. EXAM: ULTRASOUND ABDOMEN LIMITED RIGHT UPPER QUADRANT COMPARISON:  Abdominal ultrasounds 11/05/2018 and 12/19/2012 FINDINGS: Gallbladder: No gallstones or wall thickening visualized. Unchanged 4 mm polyp. Additional 3 mm polyp not clearly visible on the prior ultrasounds.  No sonographic Murphy sign noted by sonographer. Common bile duct: Diameter: 3 mm Liver: No focal lesion identified. Within normal limits in parenchymal echogenicity. Portal vein is patent on color Doppler imaging with normal direction of blood flow towards the liver. Other: None. IMPRESSION: 1. Unchanged 4 mm gallbladder polyp. Second polyp measuring 3 mm which was not clearly demonstrated on the prior ultrasounds. No routine follow-up imaging is recommended for polyps of this size which are presumed benign. 2. No acute finding. Electronically Signed   By: Logan Bores M.D.   On: 05/29/2020 10:10    Assessment and Plan:   Theresa Francis is a 69 y.o. y/o female here for follow-up of gallbladder polyps  Pt is asymptomatic from a GI standpoint  As per literature on uptodate "Polyps ?5 mm are usually benign and most frequently represent cholesterolosis. However, there are multiple studies that showed neoplastic polyps that are smaller than 6 mm and reports of such polyps that evolved into malignancy over time. Given these concerns, we perform repeat ultrasounds at least once in 12 months. However, other guidelines suggest ultrasound may be performed less frequently at intervals of one, three, and five years after the initial diagnosis, given the slow growth of adenomas. Medical management aimed at increasing the solubility of cholesterol in bile by administering ursodeoxycholic acid  is without benefit in patients with cholesterolosis"  Based on the above, we discussed several options with the patient including no further ultrasounds given stability of gallbladder polyp noted over time vs repeat surveillance in the future. Pt prefers to repeat U/S in the future. Given stability noted on ultrasound from 2020 to 2022 pt agreeable to repeat U/S in 2 yrs.   If any symptoms change prior to that, patient advised to call us back.  She was also advised that she would be due for colon polyp surveillance at [facility] in November 2022.  If she does not hear from them at that time, she was advised to call us back so we can help with the appointment and she verbalized understanding  Theresa Francis

## 2020-06-08 ENCOUNTER — Other Ambulatory Visit (INDEPENDENT_AMBULATORY_CARE_PROVIDER_SITE_OTHER): Payer: Medicare PPO

## 2020-06-08 ENCOUNTER — Other Ambulatory Visit: Payer: Self-pay

## 2020-06-08 DIAGNOSIS — I10 Essential (primary) hypertension: Secondary | ICD-10-CM

## 2020-06-08 LAB — BASIC METABOLIC PANEL
BUN: 14 mg/dL (ref 6–23)
CO2: 31 mEq/L (ref 19–32)
Calcium: 9.6 mg/dL (ref 8.4–10.5)
Chloride: 104 mEq/L (ref 96–112)
Creatinine, Ser: 0.67 mg/dL (ref 0.40–1.20)
GFR: 88.44 mL/min (ref >60.00)
Glucose, Bld: 100 mg/dL — ABNORMAL HIGH (ref 70–99)
Potassium: 4 mEq/L (ref 3.5–5.1)
Sodium: 141 mEq/L (ref 135–145)

## 2020-06-15 ENCOUNTER — Ambulatory Visit: Payer: Medicare PPO | Admitting: Internal Medicine

## 2020-06-15 ENCOUNTER — Other Ambulatory Visit: Payer: Self-pay

## 2020-06-15 ENCOUNTER — Encounter: Payer: Self-pay | Admitting: Internal Medicine

## 2020-06-15 DIAGNOSIS — F439 Reaction to severe stress, unspecified: Secondary | ICD-10-CM

## 2020-06-15 DIAGNOSIS — K824 Cholesterolosis of gallbladder: Secondary | ICD-10-CM

## 2020-06-15 DIAGNOSIS — E7800 Pure hypercholesterolemia, unspecified: Secondary | ICD-10-CM | POA: Diagnosis not present

## 2020-06-15 DIAGNOSIS — E538 Deficiency of other specified B group vitamins: Secondary | ICD-10-CM | POA: Diagnosis not present

## 2020-06-15 DIAGNOSIS — I10 Essential (primary) hypertension: Secondary | ICD-10-CM | POA: Diagnosis not present

## 2020-06-15 DIAGNOSIS — R739 Hyperglycemia, unspecified: Secondary | ICD-10-CM | POA: Diagnosis not present

## 2020-06-15 MED ORDER — TELMISARTAN 40 MG PO TABS: 40.0000 mg | ORAL_TABLET | Freq: Every day | ORAL | 3 refills | Status: DC

## 2020-06-15 NOTE — Patient Instructions (Signed)
Increase micardis to 40mg per day.  

## 2020-06-15 NOTE — Progress Notes (Signed)
Patient ID: Theresa Francis, female   DOB: 11-28-1950, 70 y.o.   MRN: [id number]   Subjective:    Patient ID: Theresa Francis, female    DOB: [birth year], 70 y.o.   MRN: [id number]  HPI This visit occurred during the SARS-CoV-2 public health emergency.  Safety protocols were in place, including screening questions prior to the visit, additional usage of staff PPE, and extensive cleaning of exam room while observing appropriate contact time as indicated for disinfecting solutions.  Patient here for a scheduled follow up.  She is here to follow up regarding her blood pressure. Also recently evaluated by GI for gallbladder polyp.  She is currently without abdominal pain, nausea or vomiting.  Recommended f/u ultrasound in 2 years.   Also, evaluated for colon polyps.  Due f/u colonoscopy in 12/2020 - at [facility]  Bowels doing well currently.  She has adjusted her diet.  Lost 10 pounds.  No chest pain or sob reported.  No abdominal pain or bowel change reported.  Blood pressures - 401-027O systolic readings.  Started on micardis 44m previously.  Tolerating.  No headache or dizziness.  Stays active.    Past Medical History:  Diagnosis Date  . Abnormal Pap smear    s/p conization.   . Basal cell carcinoma    eye lid  . History of chicken pox   . History of shingles   . Osteoarthritis   . Urine incontinence    H/O occasional   Past Surgical History:  Procedure Laterality Date  . BREAST BIOPSY Left 11/03/2014   neg./ done by Dr. BBary Castillain office  . BREAST CYST ASPIRATION Left   . COLONOSCOPY  2008   Dr SSonny Masters . COLONOSCOPY WITH PROPOFOL N/A 11/27/2019   Procedure: COLONOSCOPY WITH PROPOFOL;  Surgeon: TVirgel Manifold MD;  Location: [facility] ENDOSCOPY;  Service: Endoscopy;  Laterality: N/A;  PATIENT COVID + on 9/03/202;   COPY ON CHART  . 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 FINE NEEDLE ASPIRATION Left 2010   Dr BBary Castilla . TONSILLECTOMY  1967   Family History  Problem  Relation Age of Onset  . Breast cancer Mother 772 . Heart disease Father   . Breast cancer Sister        angiosarcoma   Social History   Socioeconomic History  . Marital status: Married    Spouse name: Not on file  . Number of children: 3  . Years of education: Not on file  . Highest education level: Not on file  Occupational History  . Not on file  Tobacco Use  . Smoking status: Never Smoker  . Smokeless tobacco: Never Used  Vaping Use  . Vaping Use: Never used  Substance and Sexual Activity  . Alcohol use: Yes    Alcohol/week: 0.0 standard drinks    Comment: drinks wine occasionally  . Drug use: No  . Sexual activity: Not on file  Other Topics Concern  . Not on file  Social History Narrative  . Not on file   Social Determinants of Health   Financial Resource Strain: Not on file  Food Insecurity: Not on file  Transportation Needs: Not on file  Physical Activity: Not on file  Stress: Not on file  Social Connections: Not on file    Outpatient Encounter Medications as of 06/15/2020  Medication Sig  . telmisartan (MICARDIS) 40 MG tablet Take 1 tablet (40 mg total) by mouth daily.  .Marland Kitchenacetaminophen (  TYLENOL) 650 MG CR tablet Take 1,300 mg by mouth every 6 (six) hours as needed for pain.  . Calcium Carbonate-Vitamin D (CALTRATE 600+D PO) Take by mouth daily.  . Cholecalciferol 50 MCG (2000 UT) CAPS Take 1 capsule by mouth daily.  . ondansetron (ZOFRAN) 4 MG tablet TAKE ONE TABLET BY MOUTH TWICE DAILY AS NEEDED FOR NAUSEA OR VOMITING  . rosuvastatin (CRESTOR) 5 MG tablet TAKE ONE TABLET BY MOUTH EACH MONDAY [facility] AND FRIDAY  . sertraline (ZOLOFT) 50 MG tablet TAKE 1 TABLET BY MOUTH DAILY  . vitamin B-12 (CYANOCOBALAMIN) 100 MCG tablet Take 100 mcg by mouth daily.  . [DISCONTINUED] telmisartan (MICARDIS) 20 MG tablet Take 1 tablet (20 mg total) by mouth daily.   No facility-administered encounter medications on file as of 06/15/2020.    Review of Systems   Constitutional: Negative for appetite change and unexpected weight change.  HENT: Negative for congestion and sinus pressure.   Respiratory: Negative for cough, chest tightness and shortness of breath.   Cardiovascular: Negative for chest pain, palpitations and leg swelling.  Gastrointestinal: Negative for abdominal pain, diarrhea, nausea and vomiting.  Genitourinary: Negative for difficulty urinating and dysuria.  Musculoskeletal: Negative for joint swelling and myalgias.  Skin: Negative for color change and rash.  Neurological: Negative for dizziness, light-headedness and headaches.  Psychiatric/Behavioral: Negative for agitation and dysphoric mood.       Objective:    Physical Exam Vitals reviewed.  Constitutional:      General: She is not in acute distress.    Appearance: Normal appearance.  HENT:     Head: Normocephalic and atraumatic.     Right Ear: External ear normal.     Left Ear: External ear normal.  Eyes:     General: No scleral icterus.       Right eye: No discharge.        Left eye: No discharge.     Conjunctiva/sclera: Conjunctivae normal.  Neck:     Thyroid: No thyromegaly.  Cardiovascular:     Rate and Rhythm: Normal rate and regular rhythm.  Pulmonary:     Effort: No respiratory distress.     Breath sounds: Normal breath sounds. No wheezing.  Abdominal:     General: Bowel sounds are normal.     Palpations: Abdomen is soft.     Tenderness: There is no abdominal tenderness.  Musculoskeletal:        General: No swelling or tenderness.     Cervical back: Neck supple. No tenderness.  Lymphadenopathy:     Cervical: No cervical adenopathy.  Skin:    Findings: No erythema or rash.  Neurological:     Mental Status: She is alert.  Psychiatric:        Mood and Affect: Mood normal.        Behavior: Behavior normal.     BP 140/70   Pulse 80   Temp (!) 97 F (36.1 C) (Temporal)   Resp 16   Ht 5' 4" (1.626 m)   Wt 137 lb 9.6 oz (62.4 kg)   SpO2 99%    BMI 23.62 kg/m  Wt Readings from Last 3 Encounters:  06/15/20 137 lb 9.6 oz (62.4 kg)  06/04/20 137 lb 9.6 oz (62.4 kg)  04/20/20 138 lb (62.6 kg)     Lab Results  Component Value Date   WBC 5.1 04/16/2020   HGB 13.6 04/16/2020   HCT 41.0 04/16/2020   PLT 274.0 04/16/2020   GLUCOSE 100 (H) 06/08/2020  CHOL 150 04/16/2020   TRIG 70.0 04/16/2020   HDL 50.40 04/16/2020   LDLDIRECT 130.6 11/26/2012   LDLCALC 86 04/16/2020   ALT 16 04/16/2020   AST 19 04/16/2020   NA 141 06/08/2020   K 4.0 06/08/2020   CL 104 06/08/2020   CREATININE 0.67 06/08/2020   BUN 14 06/08/2020   CO2 31 06/08/2020   TSH 1.00 04/16/2020   HGBA1C 5.8 04/16/2020    US ABDOMEN LIMITED RUQ (LIVER/GB)  Result Date: 05/29/2020 CLINICAL DATA:  Gallbladder polyp. EXAM: ULTRASOUND ABDOMEN LIMITED RIGHT UPPER QUADRANT COMPARISON:  Abdominal ultrasounds 11/05/2018 and 12/19/2012 FINDINGS: Gallbladder: No gallstones or wall thickening visualized. Unchanged 4 mm polyp. Additional 3 mm polyp not clearly visible on the prior ultrasounds. No sonographic Murphy sign noted by sonographer. Common bile duct: Diameter: 3 mm Liver: No focal lesion identified. Within normal limits in parenchymal echogenicity. Portal vein is patent on color Doppler imaging with normal direction of blood flow towards the liver. Other: None. IMPRESSION: 1. Unchanged 4 mm gallbladder polyp. Second polyp measuring 3 mm which was not clearly demonstrated on the prior ultrasounds. No routine follow-up imaging is recommended for polyps of this size which are presumed benign. 2. No acute finding. Electronically Signed   By: Logan Bores M.D.   On: 05/29/2020 10:10       Assessment & Plan:   Problem List Items Addressed This Visit    B12 deficiency    Continue oral B12.  Follow B12 level.       Gallbladder polyp    Evaluated by GI 2022.  Recommended f/u ultrasound in 2 years.  Currently asymptomatic.       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  Follow.       Hypercholesterolemia    Continue crestor.  Low cholesterol diet and exercise.  She has adjusted her diet.  Lost weight.  Follow lipid panel and liver function tests.        Relevant Medications   telmisartan (MICARDIS) 40 MG tablet   Other Relevant Orders   Hepatic function panel   Lipid panel   Hyperglycemia    Low carb diet and exercise.  Follow met b and a1c.       Relevant Orders   Hemoglobin A1c   Hypertension    Blood pressure remaining in 098-119J systolic range.  On micardis 52m q day.  Increased to 498mq day.  Follow metabolic panel.  Follow pressures.        Relevant Medications   telmisartan (MICARDIS) 40 MG tablet   Other Relevant Orders   Basic metabolic panel   Stress    Continue on zoloft.  Doing well on the medication.  Follow.           ChEinar PheasantMD

## 2020-06-21 ENCOUNTER — Encounter: Payer: Self-pay | Admitting: Internal Medicine

## 2020-06-21 NOTE — Assessment & Plan Note (Signed)
Continue oral B12.  Follow B12 level.

## 2020-06-21 NOTE — Assessment & Plan Note (Signed)
Probably Gilberts.  Follow.

## 2020-06-21 NOTE — Assessment & Plan Note (Signed)
Evaluated by GI 2022.  Recommended f/u ultrasound in 2 years.  Currently asymptomatic.

## 2020-06-21 NOTE — Assessment & Plan Note (Signed)
Blood pressure remaining in 960-454U systolic range.  On micardis 20mg  q day.  Increased to 40mg  q day.  Follow metabolic panel.  Follow pressures.

## 2020-06-21 NOTE — Assessment & Plan Note (Signed)
Low carb diet and exercise.  Follow met b and a1c.  

## 2020-06-21 NOTE — Assessment & Plan Note (Signed)
Continue on zoloft.  Doing well on the medication.  Follow.

## 2020-06-21 NOTE — Assessment & Plan Note (Signed)
Continue crestor.  Low cholesterol diet and exercise.  She has adjusted her diet.  Lost weight.  Follow lipid panel and liver function tests.

## 2020-07-08 ENCOUNTER — Telehealth: Payer: Self-pay | Admitting: Internal Medicine

## 2020-07-08 NOTE — Telephone Encounter (Signed)
Left message for patient to call back and schedule Medicare Annual Wellness Visit (AWV) in office.   If not able to come in office, please offer to do virtually or by telephone.   Last AWV:09/14/2018  Please schedule at anytime with Nurse Health Advisor.   

## 2020-08-03 ENCOUNTER — Telehealth: Payer: Self-pay | Admitting: Internal Medicine

## 2020-08-03 NOTE — Telephone Encounter (Signed)
Left message for patient to call back and schedule Medicare Annual Wellness Visit (AWV) in office.   If not able to come in office, please offer to do virtually or by telephone.   Last AWV:09/14/2018  Please schedule at anytime with Nurse Health Advisor.

## 2020-08-18 ENCOUNTER — Other Ambulatory Visit: Payer: Self-pay | Admitting: Internal Medicine

## 2020-09-02 ENCOUNTER — Ambulatory Visit: Payer: Medicare PPO | Admitting: Internal Medicine

## 2020-09-02 ENCOUNTER — Encounter: Payer: Self-pay | Admitting: Internal Medicine

## 2020-09-02 ENCOUNTER — Other Ambulatory Visit: Payer: Self-pay

## 2020-09-02 DIAGNOSIS — I10 Essential (primary) hypertension: Secondary | ICD-10-CM | POA: Diagnosis not present

## 2020-09-02 DIAGNOSIS — E7800 Pure hypercholesterolemia, unspecified: Secondary | ICD-10-CM | POA: Diagnosis not present

## 2020-09-02 DIAGNOSIS — F439 Reaction to severe stress, unspecified: Secondary | ICD-10-CM | POA: Diagnosis not present

## 2020-09-02 DIAGNOSIS — K824 Cholesterolosis of gallbladder: Secondary | ICD-10-CM

## 2020-09-02 DIAGNOSIS — R739 Hyperglycemia, unspecified: Secondary | ICD-10-CM

## 2020-09-02 DIAGNOSIS — Z86010 Personal history of colonic polyps: Secondary | ICD-10-CM

## 2020-09-02 LAB — BASIC METABOLIC PANEL
BUN: 16 mg/dL (ref 6–23)
CO2: 28 mEq/L (ref 19–32)
Calcium: 9.6 mg/dL (ref 8.4–10.5)
Chloride: 104 mEq/L (ref 96–112)
Creatinine, Ser: 0.8 mg/dL (ref 0.40–1.20)
GFR: 74.43 mL/min (ref >60.00)
Glucose, Bld: 101 mg/dL — ABNORMAL HIGH (ref 70–99)
Potassium: 4.6 mEq/L (ref 3.5–5.1)
Sodium: 140 mEq/L (ref 135–145)

## 2020-09-02 LAB — LIPID PANEL
Cholesterol: 189 mg/dL (ref 0–200)
HDL: 76.2 mg/dL (ref >39.00)
LDL Cholesterol: 104 mg/dL — ABNORMAL HIGH (ref 0–99)
NonHDL: 112.47
Total CHOL/HDL Ratio: 2
Triglycerides: 41 mg/dL (ref 0.0–149.0)
VLDL: 8.2 mg/dL (ref 0.0–40.0)

## 2020-09-02 LAB — HEMOGLOBIN A1C: Hgb A1c MFr Bld: 5.8 % (ref 4.6–6.5)

## 2020-09-02 LAB — HEPATIC FUNCTION PANEL
ALT: 21 U/L (ref 0–35)
AST: 23 U/L (ref 0–37)
Albumin: 4.7 g/dL (ref 3.5–5.2)
Alkaline Phosphatase: 73 U/L (ref 39–117)
Bilirubin, Direct: 0.2 mg/dL (ref 0.0–0.3)
Total Bilirubin: 1 mg/dL (ref 0.2–1.2)
Total Protein: 7 g/dL (ref 6.0–8.3)

## 2020-09-02 NOTE — Progress Notes (Signed)
Patient ID: Theresa Francis, female   DOB: 1950/10/21, 70 y.o.   MRN: [id number]   Subjective:    Patient ID: Theresa Francis, female    DOB: [birth year], 70 y.o.   MRN: [id number]  HPI This visit occurred during the SARS-CoV-2 public health emergency.  Safety protocols were in place, including screening questions prior to the visit, additional usage of staff PPE, and extensive cleaning of exam room while observing appropriate contact time as indicated for disinfecting solutions.   Patient here for a scheduled follow up.  She is here to follow up regarding her blood pressure. Also has been evaluated by GI for gallbladder polyp.  She is currently without abdominal pain, nausea or vomiting.  Recommended f/u ultrasound in 2 years.   Also, evaluated for colon polyps.  Due f/u colonoscopy in 12/2020 - at [facility]. Increased stress recently.  Discussed.  She feels she is handling things relatively well.  On zoloft.  Stays active.  No chest pain reported.  Breathing stable.  No increased cough or congestion.  No increased abdominal pain reported.  No bowel issues reported.  No sick contacts.  No fever.  No nausea or vomiting. On micardis.  Tolerating.  Blood pressure 120/72.  Planning to see Dr. George Ina for follow-up - floaters.  Past Medical History:  Diagnosis Date   Abnormal Pap smear    s/p conization.    Basal cell carcinoma    eye lid   History of chicken pox    History of shingles    Osteoarthritis    Urine incontinence    H/O occasional   Past Surgical History:  Procedure Laterality Date   BREAST BIOPSY Left 11/03/2014   neg./ done by Dr. 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   FINE NEEDLE ASPIRATION Left 2010   Dr Bary Castilla   TONSILLECTOMY  1967   Family History  Problem Relation Age of Onset   Breast cancer Mother 24   Heart disease Father    Breast cancer Sister        angiosarcoma   Social History   Socioeconomic History   Marital status: Married    Spouse name: Not on file   Number of children: 3   Years of education: Not on file   Highest education level: Not on file  Occupational History   Not on file  Tobacco Use   Smoking status: Never   Smokeless tobacco: Never  Vaping Use   Vaping Use: Never used  Substance and Sexual Activity   Alcohol use: Yes    Alcohol/week: 0.0 standard drinks    Comment: drinks wine occasionally   Drug use: No   Sexual activity: Not on file  Other Topics Concern   Not on file  Social History Narrative   Not on file   Social Determinants of Health   Financial Resource Strain: Not on file  Food Insecurity: Not on file  Transportation Needs: Not on file  Physical Activity: Not on file  Stress: Not on file  Social Connections: Not on file    Review of Systems  Constitutional:  Negative for  appetite change and unexpected weight change.  HENT:  Negative for congestion and sinus pressure.   Respiratory:  Negative for cough, chest tightness and shortness of breath.   Cardiovascular:  Negative for chest pain, palpitations and leg swelling.  Gastrointestinal:  Negative for abdominal pain, diarrhea, nausea and vomiting.  Genitourinary:  Negative for difficulty urinating and dysuria.  Musculoskeletal:  Negative for joint swelling and myalgias.  Skin:  Negative for color change and rash.  Neurological:  Negative for dizziness, light-headedness and numbness.  Psychiatric/Behavioral:  Negative for agitation and dysphoric mood.       Objective:    Physical Exam Vitals reviewed.  Constitutional:      General: She is not in acute distress.    Appearance: Normal appearance.  HENT:     Head:  Normocephalic and atraumatic.     Right Ear: External ear normal.     Left Ear: External ear normal.  Eyes:     General: No scleral icterus.       Right eye: No discharge.        Left eye: No discharge.     Conjunctiva/sclera: Conjunctivae normal.  Neck:     Thyroid: No thyromegaly.  Cardiovascular:     Rate and Rhythm: Normal rate and regular rhythm.  Pulmonary:     Effort: No respiratory distress.     Breath sounds: Normal breath sounds. No wheezing.  Abdominal:     General: Bowel sounds are normal.     Palpations: Abdomen is soft.     Tenderness: There is no abdominal tenderness.  Musculoskeletal:        General: No swelling or tenderness.     Cervical back: Neck supple. No tenderness.  Lymphadenopathy:     Cervical: No cervical adenopathy.  Skin:    Findings: No erythema or rash.  Neurological:     Mental Status: She is alert.  Psychiatric:        Mood and Affect: Mood normal.        Behavior: Behavior normal.    BP 128/70   Pulse 88   Temp 98.3 F (36.8 C)   Ht 5' 4.02" (1.626 m)   Wt 138 lb 9.6 oz (62.9 kg)   SpO2 96%   BMI 23.78 kg/m  Wt Readings from Last 3 Encounters:  09/02/20 138 lb 9.6 oz (62.9 kg)  06/15/20 137 lb 9.6 oz (62.4 kg)  06/04/20 137 lb 9.6 oz (62.4 kg)    Outpatient Encounter Medications as of 09/02/2020  Medication Sig   acetaminophen (TYLENOL) 650 MG CR tablet Take 1,300 mg by mouth every 6 (six) hours as needed for pain.   Calcium Carbonate-Vitamin D (CALTRATE 600+D PO) Take by mouth daily.   Cholecalciferol 50 MCG (2000 UT) CAPS Take 1 capsule by mouth daily.   ondansetron (ZOFRAN) 4 MG tablet TAKE ONE TABLET BY MOUTH TWICE DAILY AS NEEDED FOR NAUSEA OR VOMITING   sertraline (ZOLOFT) 50 MG tablet TAKE 1 TABLET BY MOUTH DAILY   telmisartan (MICARDIS) 40 MG tablet Take 1 tablet (40 mg total) by mouth daily.   vitamin B-12 (CYANOCOBALAMIN) 100 MCG tablet Take 100 mcg by mouth daily.   [DISCONTINUED] rosuvastatin (CRESTOR) 5 MG tablet  TAKE 1 TABLET BY MOUTH EACH MONDAY, WEDNESDAY, FRIDAY   No facility-administered encounter medications on file as of 09/02/2020.     Lab Results  Component Value Date   WBC 5.1 04/16/2020   HGB 13.6 04/16/2020   HCT 41.0 04/16/2020   PLT  274.0 04/16/2020   GLUCOSE 101 (H) 09/02/2020   CHOL 189 09/02/2020   TRIG 41.0 09/02/2020   HDL 76.20 09/02/2020   LDLDIRECT 130.6 11/26/2012   LDLCALC 104 (H) 09/02/2020   ALT 21 09/02/2020   AST 23 09/02/2020   NA 140 09/02/2020   K 4.6 09/02/2020   CL 104 09/02/2020   CREATININE 0.80 09/02/2020   BUN 16 09/02/2020   CO2 28 09/02/2020   TSH 1.00 04/16/2020   HGBA1C 5.8 09/02/2020    US ABDOMEN LIMITED RUQ (LIVER/GB)  Result Date: 05/29/2020 CLINICAL DATA:  Gallbladder polyp. EXAM: ULTRASOUND ABDOMEN LIMITED RIGHT UPPER QUADRANT COMPARISON:  Abdominal ultrasounds 11/05/2018 and 12/19/2012 FINDINGS: Gallbladder: No gallstones or wall thickening visualized. Unchanged 4 mm polyp. Additional 3 mm polyp not clearly visible on the prior ultrasounds. No sonographic Murphy sign noted by sonographer. Common bile duct: Diameter: 3 mm Liver: No focal lesion identified. Within normal limits in parenchymal echogenicity. Portal vein is patent on color Doppler imaging with normal direction of blood flow towards the liver. Other: None. IMPRESSION: 1. Unchanged 4 mm gallbladder polyp. Second polyp measuring 3 mm which was not clearly demonstrated on the prior ultrasounds. No routine follow-up imaging is recommended for polyps of this size which are presumed benign. 2. No acute finding. Electronically Signed   By: Logan Bores M.D.   On: 05/29/2020 10:10       Assessment & Plan:   Problem List Items Addressed This Visit     Gallbladder polyp    Evaluated by GI 2022.  Recommended follow-up ultrasound in 2 years.       History of colon polyps    Colonoscopy 11/27/19 - fragments (polyp) - ascending and descending colon - sessile serrated polyp.  Due f/u  colonoscopy 12/2020.       Hyperbilirubinemia    Bilirubin level 09/02/20 - wnl.        Hypercholesterolemia    Continue crestor.  Low cholesterol diet and exercise.  She has adjusted her diet.  Follow lipid panel and liver function tests.         Hyperglycemia    Low-carb diet and exercise.  Follow [facility]igh and A1c.  Lab Results  Component Value Date   HGBA1C 5.8 09/02/2020       Hypertension    Blood pressures doing well on Micardis 40 mg a day.  She is tolerating well.  Blood pressure rechecked by me 120/72.  Continue to monitor pressures.  Follow metabolic panel.       Stress    Appears to be handling stress relatively well.  Continues on Zoloft.  No changes.  Follow-up.         Einar Pheasant, MD

## 2020-09-03 ENCOUNTER — Other Ambulatory Visit: Payer: Self-pay

## 2020-09-03 MED ORDER — ROSUVASTATIN CALCIUM 5 MG PO TABS: ORAL_TABLET | ORAL | 1 refills | Status: DC

## 2020-09-11 ENCOUNTER — Ambulatory Visit: Payer: Medicare PPO

## 2020-09-14 ENCOUNTER — Encounter: Payer: Self-pay | Admitting: Internal Medicine

## 2020-09-14 NOTE — Assessment & Plan Note (Signed)
Colonoscopy 11/27/19 - fragments (polyp) - ascending and descending colon - sessile serrated polyp.  Due f/u colonoscopy 12/2020.

## 2020-09-14 NOTE — Assessment & Plan Note (Signed)
Continue crestor.  Low cholesterol diet and exercise.  She has adjusted her diet.  Follow lipid panel and liver function tests.

## 2020-09-14 NOTE — Assessment & Plan Note (Signed)
Bilirubin level 09/02/20 - wnl.

## 2020-09-14 NOTE — Assessment & Plan Note (Signed)
Low-carb diet and exercise.  Follow [facility]igh and A1c.  Lab Results  Component Value Date   HGBA1C 5.8 09/02/2020

## 2020-09-14 NOTE — Assessment & Plan Note (Signed)
Appears to be handling stress relatively well.  Continues on Zoloft.  No changes.  Follow-up.

## 2020-09-14 NOTE — Assessment & Plan Note (Signed)
Evaluated by GI 2022.  Recommended follow-up ultrasound in 2 years.

## 2020-09-14 NOTE — Assessment & Plan Note (Signed)
Blood pressures doing well on Micardis 40 mg a day.  She is tolerating well.  Blood pressure rechecked by me 120/72.  Continue to monitor pressures.  Follow metabolic panel.

## 2020-09-15 DIAGNOSIS — Z0100 Encounter for examination of eyes and vision without abnormal findings: Secondary | ICD-10-CM | POA: Diagnosis not present

## 2020-09-15 DIAGNOSIS — H43392 Other vitreous opacities, left eye: Secondary | ICD-10-CM | POA: Diagnosis not present

## 2020-10-30 ENCOUNTER — Other Ambulatory Visit: Payer: Self-pay | Admitting: Internal Medicine

## 2020-10-30 DIAGNOSIS — Z1231 Encounter for screening mammogram for malignant neoplasm of breast: Secondary | ICD-10-CM

## 2020-11-04 ENCOUNTER — Ambulatory Visit (INDEPENDENT_AMBULATORY_CARE_PROVIDER_SITE_OTHER): Payer: Medicare PPO

## 2020-11-04 VITALS — Ht 64.0 in | Wt 138.0 lb

## 2020-11-04 DIAGNOSIS — Z0000 Encounter for general adult medical examination without abnormal findings: Secondary | ICD-10-CM | POA: Diagnosis not present

## 2020-11-04 NOTE — Patient Instructions (Addendum)
Theresa Francis , Thank you for taking time to come for your Medicare Wellness Visit. I appreciate your ongoing commitment to your health goals. Please review the following plan we discussed and let me know if I can assist you in the future.   These are the goals we discussed:  Goals       Patient Stated     DIET - Low carb and pirtion control (pt-stated)      Lose about 7-8 lbs Goal weight 135lb      Increase physical activity (pt-stated)      I plan to resume Yoga  Continue walking 2 miles every other day for exercise        This is a list of the screening recommended for you and due dates:  Health Maintenance  Topic Date Due   COVID-19 Vaccine (4 - Booster for Pfizer series) 05/24/2020   Flu Shot  09/21/2020   Tetanus Vaccine  11/04/2021*   Mammogram  12/11/2021   Colon Cancer Screening  11/26/2029   DEXA scan (bone density measurement)  Completed   Hepatitis C Screening: USPSTF Recommendation to screen - Ages 21-79 yo.  Completed   Pneumonia vaccines  Completed   Zoster (Shingles) Vaccine  Completed   HPV Vaccine  Aged Out  *Topic was postponed. The date shown is not the original due date.    Conditions/risks identified: none new  Follow up in one year for your annual wellness visit    Preventive Care 65 Years and Older, Female Preventive care refers to lifestyle choices and visits with your health care provider that can promote health and wellness. What does preventive care include? A yearly physical exam. This is also called an annual well check. Dental exams once or twice a year. Routine eye exams. Ask your health care provider how often you should have your eyes checked. Personal lifestyle choices, including: Daily care of your teeth and gums. Regular physical activity. Eating a healthy diet. Avoiding tobacco and drug use. Limiting alcohol use. Practicing safe sex. Taking low-dose aspirin every day. Taking vitamin and mineral supplements as recommended by  your health care provider. What happens during an annual well check? The services and screenings done by your health care provider during your annual well check will depend on your age, overall health, lifestyle risk factors, and family history of disease. Counseling  Your health care provider may ask you questions about your: Alcohol use. Tobacco use. Drug use. Emotional well-being. Home and relationship well-being. Sexual activity. Eating habits. History of falls. Memory and ability to understand (cognition). Work and work Statistician. Reproductive health. Screening  You may have the following tests or measurements: Height, weight, and BMI. Blood pressure. Lipid and cholesterol levels. These may be checked every 5 years, or more frequently if you are over 24 years old. Skin check. Lung cancer screening. You may have this screening every year starting at age 41 if you have a 30-pack-year history of smoking and currently smoke or have quit within the past 15 years. Fecal occult blood test (FOBT) of the stool. You may have this test every year starting at age 29. Flexible sigmoidoscopy or colonoscopy. You may have a sigmoidoscopy every 5 years or a colonoscopy every 10 years starting at age 5. Hepatitis C blood test. Hepatitis B blood test. Sexually transmitted disease (STD) testing. Diabetes screening. This is done by checking your blood sugar (glucose) after you have not eaten for a while (fasting). You may have this done every 1-3  years. Bone density scan. This is done to screen for osteoporosis. You may have this done starting at age 27. Mammogram. This may be done every 1-2 years. Talk to your health care provider about how often you should have regular mammograms. Talk with your health care provider about your test results, treatment options, and if necessary, the need for more tests. Vaccines  Your health care provider may recommend certain vaccines, such as: Influenza  vaccine. This is recommended every year. Tetanus, diphtheria, and acellular pertussis (Tdap, Td) vaccine. You may need a Td booster every 10 years. Zoster vaccine. You may need this after age 28. Pneumococcal 13-valent conjugate (PCV13) vaccine. One dose is recommended after age 15. Pneumococcal polysaccharide (PPSV23) vaccine. One dose is recommended after age 90. Talk to your health care provider about which screenings and vaccines you need and how often you need them. This information is not intended to replace advice given to you by your health care provider. Make sure you discuss any questions you have with your health care provider. Document Released: 03/06/2015 Document Revised: 10/28/2015 Document Reviewed: 12/09/2014 Elsevier Interactive Patient Education  2017 [employer] Prevention in the Home Falls can cause injuries. They can happen to people of all ages. There are many things you can do to make your home safe and to help prevent falls. What can I do on the outside of my home? Regularly fix the edges of walkways and driveways and fix any cracks. Remove anything that might make you trip as you walk through a door, such as a raised step or threshold. Trim any bushes or trees on the path to your home. Use bright outdoor lighting. Clear any walking paths of anything that might make someone trip, such as rocks or tools. Regularly check to see if handrails are loose or broken. Make sure that both sides of any steps have handrails. Any raised decks and porches should have guardrails on the edges. Have any leaves, snow, or ice cleared regularly. Use sand or salt on walking paths during winter. Clean up any spills in your garage right away. This includes oil or grease spills. What can I do in the bathroom? Use night lights. Install grab bars by the toilet and in the tub and shower. Do not use towel bars as grab bars. Use non-skid mats or decals in the tub or shower. If you  need to sit down in the shower, use a plastic, non-slip stool. Keep the floor dry. Clean up any water that spills on the floor as soon as it happens. Remove soap buildup in the tub or shower regularly. Attach bath mats securely with double-sided non-slip rug tape. Do not have throw rugs and other things on the floor that can make you trip. What can I do in the bedroom? Use night lights. Make sure that you have a light by your bed that is easy to reach. Do not use any sheets or blankets that are too big for your bed. They should not hang down onto the floor. Have a firm chair that has side arms. You can use this for support while you get dressed. Do not have throw rugs and other things on the floor that can make you trip. What can I do in the kitchen? Clean up any spills right away. Avoid walking on wet floors. Keep items that you use a lot in easy-to-reach places. If you need to reach something above you, use a strong step stool that has a grab  bar. Keep electrical cords out of the way. Do not use floor polish or wax that makes floors slippery. If you must use wax, use non-skid floor wax. Do not have throw rugs and other things on the floor that can make you trip. What can I do with my stairs? Do not leave any items on the stairs. Make sure that there are handrails on both sides of the stairs and use them. Fix handrails that are broken or loose. Make sure that handrails are as long as the stairways. Check any carpeting to make sure that it is firmly attached to the stairs. Fix any carpet that is loose or worn. Avoid having throw rugs at the top or bottom of the stairs. If you do have throw rugs, attach them to the floor with carpet tape. Make sure that you have a light switch at the top of the stairs and the bottom of the stairs. If you do not have them, ask someone to add them for you. What else can I do to help prevent falls? Wear shoes that: Do not have high heels. Have rubber  bottoms. Are comfortable and fit you well. Are closed at the toe. Do not wear sandals. If you use a stepladder: Make sure that it is fully opened. Do not climb a closed stepladder. Make sure that both sides of the stepladder are locked into place. Ask someone to hold it for you, if possible. Clearly mark and make sure that you can see: Any grab bars or handrails. First and last steps. Where the edge of each step is. Use tools that help you move around (mobility aids) if they are needed. These include: Canes. Walkers. Scooters. Crutches. Turn on the lights when you go into a dark area. Replace any light bulbs as soon as they burn out. Set up your furniture so you have a clear path. Avoid moving your furniture around. If any of your floors are uneven, fix them. If there are any pets around you, be aware of where they are. Review your medicines with your doctor. Some medicines can make you feel dizzy. This can increase your chance of falling. Ask your doctor what other things that you can do to help prevent falls. This information is not intended to replace advice given to you by your health care provider. Make sure you discuss any questions you have with your health care provider. Document Released: 12/04/2008 Document Revised: 07/16/2015 Document Reviewed: 03/14/2014 Elsevier Interactive Patient Education  2017 [employer].

## 2020-11-04 NOTE — Progress Notes (Signed)
Subjective:   Theresa Francis is a 70 y.o. female who presents for Medicare Annual (Subsequent) preventive examination.  Review of Systems    No ROS.  Medicare Wellness Virtual Visit.  Visual/audio telehealth visit, UTA vital signs.   See social history for additional risk factors.   Cardiac Risk Factors include: advanced age (>26mn, >>61women);hypertension     Objective:    Today's Vitals   11/04/20 0908  Weight: 138 lb (62.6 kg)  Height: '5\' 4"'$  (1.626 m)   Body mass index is 23.69 kg/m.  Advanced Directives 11/04/2020 11/27/2019 09/14/2018 09/12/2017  Does Patient Have a Medical Advance Directive? Yes Yes Yes No  Type of AParamed[place]of [place] will Living will [place] will -  Does patient want to make changes to medical advance directive? No - Patient declined - No - Patient declined -  Copy of HM[place]ie [place] Chart? No - copy requested - No - copy requested -  Would patient like information on creating a medical advance directive? - - - Yes (MAU/Ambulatory/Procedural Areas - Information given)    Current Medications (verified) Outpatient Encounter Medications as of 11/04/2020  Medication Sig   acetaminophen (TYLENOL) 650 MG CR tablet Take 1,300 mg by mouth every 6 (six) hours as needed for pain.   Calcium Carbonate-Vitamin D (CALTRATE 600+D PO) Take by mouth daily.   Cholecalciferol 50 MCG (2000 UT) CAPS Take 1 capsule by mouth daily.   ondansetron (ZOFRAN) 4 MG tablet TAKE ONE TABLET BY MOUTH TWICE DAILY AS NEEDED FOR NAUSEA OR VOMITING   rosuvastatin (CRESTOR) 5 MG tablet TAKE 1 TABLET BY MOUTH EVERY DAY   sertraline (ZOLOFT) 50 MG tablet TAKE 1 TABLET BY MOUTH DAILY   telmisartan (MICARDIS) 40 MG tablet Take 1 tablet (40 mg total) by mouth daily.   vitamin B-12 (CYANOCOBALAMIN) 100 MCG tablet Take 100 mcg by mouth daily.   No facility-administered encounter medications on file as of  11/04/2020.    Allergies (verified) Patient has no known allergies.   History: Past Medical History:  Diagnosis Date   Abnormal Pap smear    s/p conization.    Basal cell carcinoma    eye lid   History of chicken pox    History of shingles    Osteoarthritis    Urine incontinence    H/O occasional   Past Surgical History:  Procedure Laterality Date   BREAST BIOPSY Left 11/03/2014   neg./ done by Dr. BBary Castillain office   BREAST CYST ASPIRATION Left    COLONOSCOPY  2008   Dr SSonny Masters  COLONOSCOPY WITH PROPOFOL N/A 11/27/2019   Procedure: COLONOSCOPY WITH PROPOFOL;  Surgeon: TVirgel Manifold MD;  Location: [facility] ENDOSCOPY;  Service: Endoscopy;  Laterality: N/A;  PATIENT COVID + on 9/03/202;   COPY ON CHART   EYE SURGERY Left 1957, 2002   x2 for Strbismis   FINE NEEDLE ASPIRATION Left 2010   Dr BBary Castilla  TONSILLECTOMY  1967   Family History  Problem Relation Age of Onset   Breast cancer Mother 761  Heart disease Father    Breast cancer Sister        angiosarcoma   Social History   Socioeconomic History   Marital status: Married    Spouse name: Not on file   Number of children: 3   Years of education: Not on file   Highest education level: Not on file  Occupational History  Not on file  Tobacco Use   Smoking status: Never   Smokeless tobacco: Never  Vaping Use   Vaping Use: Never used  Substance and Sexual Activity   Alcohol use: Yes    Alcohol/week: 0.0 standard drinks    Comment: drinks wine occasionally   Drug use: No   Sexual activity: Not on file  Other Topics Concern   Not on file  Social History Narrative   Not on file   Social Determinants of Health   Financial Resource Strain: Low Risk    Difficulty of Paying Living Expenses: Not hard at all  Food Insecurity: No Food Insecurity   Worried About Charity fundraiser in the Last Year: Never true   [place] in the Last Year: Never true  Transportation Needs: No Transportation Needs    Lack of Transportation (Medical): No   Lack of Transportation (Non-Medical): No  Physical Activity: Sufficiently Active   Days of Exercise per Week: 3 days   Minutes of Exercise per Session: 60 min  Stress: No Stress Concern Present   Feeling of Stress : Not at all  Social Connections: Unknown   Frequency of Communication with Friends and Family: More than three times a week   Frequency of Social Gatherings with Friends and Family: More than three times a week   Attends Religious Services: Not on Electrical engineer or Organizations: Not on file   Attends Archivist Meetings: Not on file   Marital Status: Not on file    Tobacco Counseling Counseling given: Not Answered   Clinical Intake:  Pre-visit preparation completed: Yes        Diabetes: No  How often do you need to have someone help you when you read instructions, pamphlets, or other written materials from your doctor or pharmacy?: 1 - Never  Interpreter Needed?: No      Activities of Daily Living In your present state of health, do you have any difficulty performing the following activities: 11/04/2020  Hearing? N  Vision? N  Difficulty concentrating or making decisions? N  Walking or climbing stairs? N  Dressing or bathing? N  Doing errands, shopping? N  Preparing Food and eating ? N  Using the Toilet? N  In the past six months, have you accidently leaked urine? N  Do you have problems with loss of bowel control? N  Managing your Medications? N  Managing your Finances? N  Housekeeping or managing your Housekeeping? N  Some recent data might be hidden    Patient Care Team: Einar Pheasant, MD as PCP - General (Internal Medicine) Einar Pheasant, MD (Internal Medicine) Bary Castilla Forest Gleason, MD (General Surgery)  Indicate any recent Medical Services you may have received from other than [facility] providers in the past year (date may be approximate).     Assessment:   This is a routine  wellness examination for [facility].  I connected with [name] today by telephone and verified that I am speaking with the correct person using two identifiers. Location patient: home Location provider: work Persons participating in the virtual visit: patient, Marine scientist.    I discussed the limitations, risks, security and privacy concerns of performing an evaluation and management service by telephone and the availability of in person appointments. The patient expressed understanding and verbally consented to this telephonic visit.    Interactive audio and video telecommunications were attempted between this provider and patient, however failed, due to patient having technical difficulties  OR patient did not have access to video capability.  We continued and completed visit with audio only.  Some vital signs may be absent or patient reported.   Hearing/Vision screen Hearing Screening - Comments:: Patient is able to hear conversational tones without difficulty.  No issues reported. Vision Screening - Comments:: They have seen their ophthalmologist in the last 12 months. New appointment scheduled for follow up  Followed by [facility]  Wears corrective lenses   Dietary issues and exercise activities discussed: Current Exercise Habits: Home exercise routine, Type of exercise: walking, Time (Minutes): 60, Frequency (Times/Week): 3, Weekly Exercise (Minutes/Week): 180, Intensity: Mild  Healthy low carb diet Good water intake   Goals Addressed               This Visit's Progress     Patient Stated     DIET - Low carb and pirtion control (pt-stated)   On track     Lose about 7-8 lbs Goal weight 135lb      Increase physical activity (pt-stated)        I plan to resume Yoga  Continue walking 2 miles every other day for exercise       Depression Screen PHQ 2/9 Scores 11/04/2020 09/02/2020 10/01/2019 09/14/2018 09/12/2017 05/15/2017 04/28/2016  PHQ - 2 Score 0 1 0 0 0 1 2    Fall  Risk Fall Risk  11/04/2020 09/02/2020 12/12/2019 10/01/2019 04/17/2019  Falls in the past year? 0 0 0 0 0  Number falls in past yr: 0 0 - 0 -  Injury with Fall? 0 0 - 0 -  Follow up Falls evaluation completed Falls evaluation completed Falls evaluation completed Falls evaluation completed Falls evaluation completed    [place]: Adequate lighting in your home to reduce risk of falls? Yes   ASSISTIVE DEVICES UTILIZED TO PREVENT FALLS: Life alert? No  Use of a cane, walker or w/c? No    TIMED UP AND GO: Was the test performed? No .   Cognitive Function: Patient is alert and oriented x3.  Enjoys reading, playing bridge and other brain health games/activities.  Denies difficulty focusing, making decisions, memory loss.  MMSE/6CIT deferred. Normal by direct communication/observation.  MMSE - Mini Mental State Exam 09/12/2017  Orientation to time 5  Orientation to Place 5  Registration 3  Attention/ Calculation 5  Recall 3  Language- name 2 objects 2  Language- repeat 1  Language- follow 3 step command 3  Language- read & follow direction 1  Write a sentence 1  Copy design 1  Total score 30     6CIT Screen 09/14/2018  What Year? 0 points  What month? 0 points  What time? 0 points  Count back from 20 0 points  Months in reverse 0 points  Repeat phrase 0 points  Total Score 0   Immunizations Immunization History  Administered Date(s) Administered   Influenza Split 11/22/2012, 11/19/2013   Influenza Whole 11/21/2016   Influenza, High Dose Seasonal PF 10/28/2015, 11/19/2016, 11/15/2018   Influenza,inj,Quad PF,6+ Mos 12/03/2014, 12/06/2017   Influenza-Unspecified 12/10/2014, 12/06/2017, 11/18/2019   PFIZER(Purple Top)SARS-COV-2 Vaccination 02/25/2019, 03/18/2019, 01/24/2020   Pneumococcal Conjugate-13 12/19/2016   Pneumococcal Polysaccharide-23 11/23/2017   Zoster Recombinat (Shingrix) 10/18/2017, 01/05/2018   TDAP status: Due, Education  has been provided regarding the importance of this vaccine. Advised may receive this vaccine at local pharmacy or Health Dept. Aware to provide a copy of the vaccination record if obtained from local  pharmacy or Health Dept. Verbalized acceptance and understanding. Deferred.   Influenza vaccine- plans to receive later in the season. Deferred.   Health Maintenance Health Maintenance  Topic Date Due   COVID-19 Vaccine (4 - Booster for [place] series) 05/24/2020   INFLUENZA VACCINE  09/21/2020   TETANUS/TDAP  11/04/2021 (Originally 10/30/1969)   MAMMOGRAM  12/11/2021   COLONOSCOPY (Pts 45-37yr Insurance coverage will need to be confirmed)  11/26/2029   DEXA SCAN  Completed   Hepatitis C Screening  Completed   PNA vac Low Risk Adult  Completed   Zoster Vaccines- Shingrix  Completed   HPV VACCINES  Aged Out   [facility]- [facility] Awaiting scheduling. Encouraged to reach pcp as needed.   Mammogram status: Completed 10/24. Repeat every year  Lung Cancer Screening: (Low Dose CT Chest recommended if Age 71-80years, 30 pack-year currently smoking OR have quit w/in 15years.) does not qualify.   Vision Screening: Recommended annual ophthalmology exams for early detection of glaucoma and other disorders of the eye. Is the patient up to date with their annual eye exam?  Yes   Dental Screening: Recommended annual dental exams for proper oral hygiene  Community Resource Referral / Chronic Care Management: CRR required this visit?  No   CCM required this visit?  No      Plan:   Keep all routine maintenance appointments.   I have personally reviewed and noted the following in the patient's chart:   Medical and social history Use of alcohol, tobacco or illicit drugs  Current medications and supplements including opioid prescriptions. Not taking opioid.  Functional ability and status Nutritional status Physical  activity Advanced directives List of other physicians Hospitalizations, surgeries, and ER visits in previous 12 months Vitals Screenings to include cognitive, depression, and falls Referrals and appointments  In addition, I have reviewed and discussed with patient certain preventive protocols, quality metrics, and best practice recommendations. A written personalized care plan for preventive services as well as general preventive health recommendations were provided to patient via mychart.     OVarney Biles LPN   [id number]

## 2020-11-06 ENCOUNTER — Other Ambulatory Visit: Payer: Self-pay | Admitting: Internal Medicine

## 2020-11-16 DIAGNOSIS — H43813 Vitreous degeneration, bilateral: Secondary | ICD-10-CM | POA: Diagnosis not present

## 2020-12-14 ENCOUNTER — Ambulatory Visit
Admission: RE | Admit: 2020-12-14 | Discharge: 2020-12-14 | Disposition: A | Discharge status: Home / Self Care | Payer: Medicare PPO | Source: Ambulatory Visit | Attending: Internal Medicine | Admitting: Internal Medicine

## 2020-12-14 ENCOUNTER — Other Ambulatory Visit: Payer: Self-pay

## 2020-12-14 DIAGNOSIS — Z1231 Encounter for screening mammogram for malignant neoplasm of breast: Secondary | ICD-10-CM | POA: Insufficient documentation

## 2021-01-04 ENCOUNTER — Encounter: Payer: Self-pay | Admitting: Internal Medicine

## 2021-01-04 ENCOUNTER — Ambulatory Visit: Payer: Medicare PPO | Admitting: Internal Medicine

## 2021-01-04 ENCOUNTER — Other Ambulatory Visit: Payer: Self-pay

## 2021-01-04 VITALS — BP 125/70 | HR 87 | Temp 98.8°F | Ht 64.0 in | Wt 141.2 lb

## 2021-01-04 DIAGNOSIS — K824 Cholesterolosis of gallbladder: Secondary | ICD-10-CM | POA: Diagnosis not present

## 2021-01-04 DIAGNOSIS — Z86010 Personal history of colonic polyps: Secondary | ICD-10-CM

## 2021-01-04 DIAGNOSIS — F439 Reaction to severe stress, unspecified: Secondary | ICD-10-CM

## 2021-01-04 DIAGNOSIS — E7800 Pure hypercholesterolemia, unspecified: Secondary | ICD-10-CM

## 2021-01-04 DIAGNOSIS — I10 Essential (primary) hypertension: Secondary | ICD-10-CM | POA: Diagnosis not present

## 2021-01-04 DIAGNOSIS — R739 Hyperglycemia, unspecified: Secondary | ICD-10-CM

## 2021-01-04 LAB — LIPID PANEL
Cholesterol: 165 mg/dL (ref 0–200)
HDL: 70.6 mg/dL (ref >39.00)
LDL Cholesterol: 84 mg/dL (ref 0–99)
NonHDL: 94.19
Total CHOL/HDL Ratio: 2
Triglycerides: 50 mg/dL (ref 0.0–149.0)
VLDL: 10 mg/dL (ref 0.0–40.0)

## 2021-01-04 LAB — BASIC METABOLIC PANEL
BUN: 15 mg/dL (ref 6–23)
CO2: 29 mEq/L (ref 19–32)
Calcium: 9.2 mg/dL (ref 8.4–10.5)
Chloride: 105 mEq/L (ref 96–112)
Creatinine, Ser: 0.75 mg/dL (ref 0.40–1.20)
GFR: 80.23 mL/min (ref >60.00)
Glucose, Bld: 99 mg/dL (ref 70–99)
Potassium: 4.5 mEq/L (ref 3.5–5.1)
Sodium: 140 mEq/L (ref 135–145)

## 2021-01-04 LAB — HEMOGLOBIN A1C: Hgb A1c MFr Bld: 6.1 % (ref 4.6–6.5)

## 2021-01-04 LAB — HEPATIC FUNCTION PANEL
ALT: 18 U/L (ref 0–35)
AST: 19 U/L (ref 0–37)
Albumin: 4.6 g/dL (ref 3.5–5.2)
Alkaline Phosphatase: 75 U/L (ref 39–117)
Bilirubin, Direct: 0.2 mg/dL (ref 0.0–0.3)
Total Bilirubin: 1.2 mg/dL (ref 0.2–1.2)
Total Protein: 6.8 g/dL (ref 6.0–8.3)

## 2021-01-04 NOTE — Progress Notes (Signed)
Patient ID: Theresa Francis, female   DOB: 11-Apr-1950, 70 y.o.   MRN: [id number]   Subjective:    Patient ID: Theresa Francis, female    DOB: 1950/08/23, 70 y.o.   MRN: [id number]  This visit occurred during the SARS-CoV-2 public health emergency.  Safety protocols were in place, including screening questions prior to the visit, additional usage of staff PPE, and extensive cleaning of exam room while observing appropriate contact time as indicated for disinfecting solutions.   Patient here for a scheduled follow up.  Chief Complaint  Patient presents with   Follow-up    4 mo/ colonoscopy question   .   HPI Here to follow up regarding her blood pressure.  Taking micardis.  Blood pressures on outside checks:  highest 182 systolic readings.  No chest pain or sob reported.  Stays active.  No acid reflux.  No abdominal pain reported.  Had questions about f/u colonoscopy.  Had colonoscopy 12/2019 - polyps ascending colon and cecal polyps.  Per note, fragments removed - sessile serrated polyp  recommended f/u in 12/2020.  Eating more vegetables.  Keeps bowels regular.    Past Medical History:  Diagnosis Date   Abnormal Pap smear    s/p conization.    Basal cell carcinoma    eye lid   History of chicken pox    History of shingles    Osteoarthritis    Urine incontinence    H/O occasional   Past Surgical History:  Procedure Laterality Date   BREAST BIOPSY Left 11/03/2014   neg./ done by Dr. 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   FINE NEEDLE ASPIRATION Left 2010   Dr Bary Castilla   TONSILLECTOMY  1967   Family History  Problem Relation Age of Onset   Breast cancer  Mother 23   Heart disease Father    Breast cancer Sister        angiosarcoma   Social History   Socioeconomic History   Marital status: Married    Spouse name: Not on file   Number of children: 3   Years of education: Not on file   Highest education level: Not on file  Occupational History   Not on file  Tobacco Use   Smoking status: Never   Smokeless tobacco: Never  Vaping Use   Vaping Use: Never used  Substance and Sexual Activity   Alcohol use: Yes    Alcohol/week: 0.0 standard drinks    Comment: drinks wine occasionally   Drug use: No   Sexual activity: Not on file  Other Topics Concern   Not on file  Social History Narrative   Not on file   Social Determinants of Health   Financial Resource Strain: Low Risk    Difficulty of Paying Living Expenses: Not hard at all  Food Insecurity: No Food Insecurity   Worried About Charity fundraiser in the Last Year: Never true   [place] in the Last Year: Never true  Transportation Needs: No Transportation Needs   Lack of Transportation (Medical): No   Lack of Transportation (Non-Medical): No  Physical Activity: Sufficiently Active   Days of Exercise per Week: 3 days   Minutes of Exercise per Session: 60 min  Stress: No Stress Concern Present   Feeling of Stress : Not at all  Social Connections: Unknown   Frequency of Communication with Friends and Family: More than three times a week   Frequency of Social Gatherings with Friends and Family: More than three times a week   Attends Religious Services: Not on Electrical engineer or Organizations: Not on file   Attends Archivist Meetings: Not on file   Marital Status: Not on file     Review of Systems  Constitutional:  Negative for appetite change and unexpected weight change.  HENT:  Negative for congestion and sinus pressure.   Respiratory:  Negative for cough, chest tightness and shortness of breath.   Cardiovascular:  Negative for chest  pain, palpitations and leg swelling.  Gastrointestinal:  Negative for abdominal pain, constipation, diarrhea and vomiting.  Genitourinary:  Negative for difficulty urinating and dysuria.  Musculoskeletal:  Negative for joint swelling and myalgias.  Skin:  Negative for color change and rash.  Neurological:  Negative for dizziness, light-headedness and headaches.  Psychiatric/Behavioral:  Negative for agitation and dysphoric mood.       Objective:     BP 125/70   Pulse 87   Temp 98.8 F (37.1 C) (Oral)   Ht 5\' 4"  (1.626 m)   Wt 141 lb 3.2 oz (64 kg)   SpO2 98%   BMI 24.24 kg/m  Wt Readings from Last 3 Encounters:  01/04/21 141 lb 3.2 oz (64 kg)  11/04/20 138 lb (62.6 kg)  09/02/20 138 lb 9.6 oz (62.9 kg)    Physical Exam Vitals reviewed.  Constitutional:      General: She is not in acute distress.    Appearance: Normal appearance.  HENT:     Head: Normocephalic and atraumatic.     Right Ear: External ear normal.     Left Ear: External ear normal.  Eyes:     General: No scleral icterus.       Right eye: No discharge.        Left eye: No discharge.     Conjunctiva/sclera: Conjunctivae normal.  Neck:     Thyroid: No thyromegaly.  Cardiovascular:     Rate and Rhythm: Normal rate and regular rhythm.  Pulmonary:     Effort: No respiratory distress.     Breath sounds: Normal breath sounds. No wheezing.  Abdominal:     General: Bowel sounds are normal.     Palpations: Abdomen is soft.     Tenderness: There is no abdominal tenderness.  Musculoskeletal:        General: No swelling or tenderness.     Cervical back: Neck supple. No tenderness.  Lymphadenopathy:     Cervical: No cervical adenopathy.  Skin:    Findings: No erythema or rash.  Neurological:     Mental Status: She is alert.  Psychiatric:        Mood and Affect: Mood normal.        Behavior: Behavior normal.     Outpatient Encounter Medications as of 01/04/2021  Medication Sig   acetaminophen  (TYLENOL) 650 MG CR tablet Take 1,300 mg by mouth every 6 (six) hours as needed for pain.   Calcium Carbonate-Vitamin D (CALTRATE 600+D PO) Take by mouth daily.   Cholecalciferol 50 MCG (2000 UT) CAPS Take 1 capsule by  mouth daily.   ondansetron (ZOFRAN) 4 MG tablet TAKE ONE TABLET BY MOUTH TWICE DAILY AS NEEDED FOR NAUSEA OR VOMITING   rosuvastatin (CRESTOR) 5 MG tablet TAKE 1 TABLET BY MOUTH EVERY DAY   sertraline (ZOLOFT) 50 MG tablet TAKE 1 TABLET BY MOUTH DAILY   telmisartan (MICARDIS) 40 MG tablet TAKE 1 TABLET BY MOUTH DAILY.   vitamin B-12 (CYANOCOBALAMIN) 100 MCG tablet Take 100 mcg by mouth daily.   No facility-administered encounter medications on file as of 01/04/2021.     Lab Results  Component Value Date   WBC 5.1 04/16/2020   HGB 13.6 04/16/2020   HCT 41.0 04/16/2020   PLT 274.0 04/16/2020   GLUCOSE 99 01/04/2021   CHOL 165 01/04/2021   TRIG 50.0 01/04/2021   HDL 70.60 01/04/2021   LDLDIRECT 130.6 11/26/2012   LDLCALC 84 01/04/2021   ALT 18 01/04/2021   AST 19 01/04/2021   NA 140 01/04/2021   K 4.5 01/04/2021   CL 105 01/04/2021   CREATININE 0.75 01/04/2021   BUN 15 01/04/2021   CO2 29 01/04/2021   TSH 1.00 04/16/2020   HGBA1C 6.1 01/04/2021    MM 3D SCREEN BREAST BILATERAL  Result Date: 12/16/2020 CLINICAL DATA:  Screening. EXAM: DIGITAL SCREENING BILATERAL MAMMOGRAM WITH TOMOSYNTHESIS AND CAD TECHNIQUE: Bilateral screening digital craniocaudal and mediolateral oblique mammograms were obtained. Bilateral screening digital breast tomosynthesis was performed. The images were evaluated with computer-aided detection. COMPARISON:  Previous exam(s). ACR Breast Density Category b: There are scattered areas of fibroglandular density. FINDINGS: There are no findings suspicious for malignancy. IMPRESSION: No mammographic evidence of malignancy. A result letter of this screening mammogram will be mailed directly to the patient. RECOMMENDATION: Screening mammogram in one  year. (Code:SM-B-01Y) BI-RADS CATEGORY  1: Negative. Electronically Signed   By: Ammie Ferrier M.D.   On: 12/16/2020 14:11      Assessment & Plan:   Problem List Items Addressed This Visit     Gallbladder polyp    Evaluated by GI 2022.  Recommended follow-up ultrasound in 2 years.      History of colon polyps    Colonoscopy 11/27/19 - fragments (polyp) - ascending and descending colon - sessile serrated polyp.  Due f/u colonoscopy 12/2020.      Relevant Orders   Ambulatory referral to Gastroenterology   Hypercholesterolemia    Continue crestor.  Low cholesterol diet and exercise.  She has adjusted her diet.  Follow lipid panel and liver function tests.        Relevant Orders   Hepatic function panel (Completed)   Lipid panel (Completed)   Hyperglycemia    Low-carb diet and exercise.  Follow [facility] and A1c.  Lab Results  Component Value Date   HGBA1C 6.1 01/04/2021       Relevant Orders   Hemoglobin A1c (Completed)   Hypertension - Primary    Blood pressures as outlined. On Micardis 40 mg a day.  She is tolerating well.   Continue to monitor pressures.  Follow metabolic panel.      Relevant Orders   Basic metabolic panel (Completed)   Stress    Appears to be handling stress relatively well.  Continues on Zoloft.  No changes.  Follow-up.        Einar Pheasant, MD

## 2021-01-05 ENCOUNTER — Telehealth: Payer: Self-pay | Admitting: Internal Medicine

## 2021-01-05 ENCOUNTER — Encounter: Payer: Self-pay | Admitting: Internal Medicine

## 2021-01-05 NOTE — Assessment & Plan Note (Signed)
Appears to be handling stress relatively well.  Continues on Zoloft.  No changes.  Follow-up.

## 2021-01-05 NOTE — Assessment & Plan Note (Signed)
Colonoscopy 11/27/19 - fragments (polyp) - ascending and descending colon - sessile serrated polyp.  Due f/u colonoscopy 12/2020.

## 2021-01-05 NOTE — Telephone Encounter (Signed)
Lft pt vm with the number to [facility] gastro if pt wanted to follow up on the referral. thanks

## 2021-01-05 NOTE — Assessment & Plan Note (Signed)
Blood pressures as outlined. On Micardis 40 mg a day.  She is tolerating well.   Continue to monitor pressures.  Follow metabolic panel.

## 2021-01-05 NOTE — Assessment & Plan Note (Signed)
Evaluated by GI 2022.  Recommended follow-up ultrasound in 2 years.

## 2021-01-05 NOTE — Assessment & Plan Note (Signed)
Low-carb diet and exercise.  Follow [facility] and A1c.  Lab Results  Component Value Date   HGBA1C 6.1 01/04/2021

## 2021-01-05 NOTE — Assessment & Plan Note (Signed)
Continue crestor.  Low cholesterol diet and exercise.  She has adjusted her diet.  Follow lipid panel and liver function tests.

## 2021-02-19 ENCOUNTER — Encounter: Payer: Self-pay | Admitting: Gastroenterology

## 2021-03-31 ENCOUNTER — Other Ambulatory Visit: Payer: Self-pay | Admitting: Internal Medicine

## 2021-04-14 DIAGNOSIS — E785 Hyperlipidemia, unspecified: Secondary | ICD-10-CM | POA: Diagnosis not present

## 2021-04-14 DIAGNOSIS — K5730 Diverticulosis of large intestine without perforation or abscess without bleeding: Secondary | ICD-10-CM | POA: Diagnosis not present

## 2021-04-14 DIAGNOSIS — K635 Polyp of colon: Secondary | ICD-10-CM | POA: Diagnosis not present

## 2021-04-14 DIAGNOSIS — Z1211 Encounter for screening for malignant neoplasm of colon: Secondary | ICD-10-CM | POA: Diagnosis not present

## 2021-04-14 DIAGNOSIS — D122 Benign neoplasm of ascending colon: Secondary | ICD-10-CM | POA: Diagnosis not present

## 2021-04-14 DIAGNOSIS — F32A Depression, unspecified: Secondary | ICD-10-CM | POA: Diagnosis not present

## 2021-04-14 DIAGNOSIS — D124 Benign neoplasm of descending colon: Secondary | ICD-10-CM | POA: Diagnosis not present

## 2021-04-14 DIAGNOSIS — K648 Other hemorrhoids: Secondary | ICD-10-CM | POA: Diagnosis not present

## 2021-04-14 DIAGNOSIS — I10 Essential (primary) hypertension: Secondary | ICD-10-CM | POA: Diagnosis not present

## 2021-04-14 DIAGNOSIS — Z86010 Personal history of colonic polyps: Secondary | ICD-10-CM | POA: Diagnosis not present

## 2021-04-14 DIAGNOSIS — Z9889 Other specified postprocedural states: Secondary | ICD-10-CM | POA: Diagnosis not present

## 2021-04-20 DIAGNOSIS — R208 Other disturbances of skin sensation: Secondary | ICD-10-CM | POA: Diagnosis not present

## 2021-04-20 DIAGNOSIS — L718 Other rosacea: Secondary | ICD-10-CM | POA: Diagnosis not present

## 2021-04-20 DIAGNOSIS — L820 Inflamed seborrheic keratosis: Secondary | ICD-10-CM | POA: Diagnosis not present

## 2021-04-20 DIAGNOSIS — Z85828 Personal history of other malignant neoplasm of skin: Secondary | ICD-10-CM | POA: Diagnosis not present

## 2021-04-20 DIAGNOSIS — L821 Other seborrheic keratosis: Secondary | ICD-10-CM | POA: Diagnosis not present

## 2021-04-20 DIAGNOSIS — D225 Melanocytic nevi of trunk: Secondary | ICD-10-CM | POA: Diagnosis not present

## 2021-04-20 DIAGNOSIS — D2262 Melanocytic nevi of left upper limb, including shoulder: Secondary | ICD-10-CM | POA: Diagnosis not present

## 2021-05-06 ENCOUNTER — Other Ambulatory Visit: Payer: Self-pay

## 2021-05-06 ENCOUNTER — Ambulatory Visit (INDEPENDENT_AMBULATORY_CARE_PROVIDER_SITE_OTHER): Payer: Medicare PPO | Admitting: Internal Medicine

## 2021-05-06 VITALS — BP 134/70 | HR 78 | Temp 97.9°F | Resp 16 | Ht 64.0 in | Wt 145.6 lb

## 2021-05-06 DIAGNOSIS — Z0000 Encounter for general adult medical examination without abnormal findings: Secondary | ICD-10-CM

## 2021-05-06 DIAGNOSIS — R739 Hyperglycemia, unspecified: Secondary | ICD-10-CM | POA: Diagnosis not present

## 2021-05-06 DIAGNOSIS — K824 Cholesterolosis of gallbladder: Secondary | ICD-10-CM | POA: Diagnosis not present

## 2021-05-06 DIAGNOSIS — I10 Essential (primary) hypertension: Secondary | ICD-10-CM | POA: Diagnosis not present

## 2021-05-06 DIAGNOSIS — F439 Reaction to severe stress, unspecified: Secondary | ICD-10-CM | POA: Diagnosis not present

## 2021-05-06 DIAGNOSIS — E7800 Pure hypercholesterolemia, unspecified: Secondary | ICD-10-CM | POA: Diagnosis not present

## 2021-05-06 LAB — HEPATIC FUNCTION PANEL
ALT: 19 U/L (ref 0–35)
AST: 20 U/L (ref 0–37)
Albumin: 4.6 g/dL (ref 3.5–5.2)
Alkaline Phosphatase: 75 U/L (ref 39–117)
Bilirubin, Direct: 0.2 mg/dL (ref 0.0–0.3)
Total Bilirubin: 1.2 mg/dL (ref 0.2–1.2)
Total Protein: 7.1 g/dL (ref 6.0–8.3)

## 2021-05-06 LAB — CBC WITH DIFFERENTIAL/PLATELET
Basophils Absolute: 0.1 10*3/uL (ref 0.0–0.1)
Basophils Relative: 0.8 % (ref 0.0–3.0)
Eosinophils Absolute: 0.1 10*3/uL (ref 0.0–0.7)
Eosinophils Relative: 1.4 % (ref 0.0–5.0)
HCT: 38.6 % (ref 36.0–46.0)
Hemoglobin: 12.9 g/dL (ref 12.0–15.0)
Lymphocytes Relative: 34 % (ref 12.0–46.0)
Lymphs Abs: 2.3 10*3/uL (ref 0.7–4.0)
MCHC: 33.5 g/dL (ref 30.0–36.0)
MCV: 87.1 fl (ref 78.0–100.0)
Monocytes Absolute: 0.5 10*3/uL (ref 0.1–1.0)
Monocytes Relative: 8 % (ref 3.0–12.0)
Neutro Abs: 3.7 10*3/uL (ref 1.4–7.7)
Neutrophils Relative %: 55.8 % (ref 43.0–77.0)
Platelets: 316 10*3/uL (ref 150.0–400.0)
RBC: 4.43 Mil/uL (ref 3.87–5.11)
RDW: 14 % (ref 11.5–15.5)
WBC: 6.6 10*3/uL (ref 4.0–10.5)

## 2021-05-06 LAB — TSH: TSH: 1.76 u[IU]/mL (ref 0.35–5.50)

## 2021-05-06 LAB — BASIC METABOLIC PANEL
BUN: 16 mg/dL (ref 6–23)
CO2: 27 mEq/L (ref 19–32)
Calcium: 9.6 mg/dL (ref 8.4–10.5)
Chloride: 104 mEq/L (ref 96–112)
Creatinine, Ser: 0.7 mg/dL (ref 0.40–1.20)
GFR: 86.95 mL/min (ref >60.00)
Glucose, Bld: 98 mg/dL (ref 70–99)
Potassium: 4.2 mEq/L (ref 3.5–5.1)
Sodium: 139 mEq/L (ref 135–145)

## 2021-05-06 LAB — LIPID PANEL
Cholesterol: 182 mg/dL (ref 0–200)
HDL: 69.9 mg/dL (ref >39.00)
LDL Cholesterol: 96 mg/dL (ref 0–99)
NonHDL: 112.07
Total CHOL/HDL Ratio: 3
Triglycerides: 80 mg/dL (ref 0.0–149.0)
VLDL: 16 mg/dL (ref 0.0–40.0)

## 2021-05-06 LAB — HEMOGLOBIN A1C: Hgb A1c MFr Bld: 5.9 % (ref 4.6–6.5)

## 2021-05-06 MED ORDER — TELMISARTAN 80 MG PO TABS: 80.0000 mg | ORAL_TABLET | Freq: Every day | ORAL | 3 refills | Status: DC

## 2021-05-06 NOTE — Assessment & Plan Note (Addendum)
Physical today 05/06/21.   ?Colonoscopy 12/2020: The examined portion of the ileum was normal. ?? ? ? ? ? ? ? ? ? ? ? 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 ?? ? ? ? ? ? ? ? ? ? ? - One 12 mm polyp in the ascending colon, removed with  ?? ? ? ? ? ? ? ? ? ? ? mucosal resection. Resected and retrieved. ?? ? ? ? ? ? ? ? ? ? ? 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 ?? ? ? ? ? ? ? ? ? ? ? - One 10 mm polyp in the descending colon, removed  ?? ? ? ? ? ? ? ? ? ? ? with a cold snare. Resected and retrieved. ?? ? ? ? ? ? ? ? ? ? ? - Diverticulosis in the sigmoid colon. ?? ? ? ? ? ? ? ? ? ? ? - Internal hemorrhoids.   ?Recommended f/u colonoscopy in 3 years.  ? Mammogram 12/14/20 - Birads I.   ?

## 2021-05-06 NOTE — Assessment & Plan Note (Signed)
Blood pressure as outlined.  Increase micardis to '80mg'$  q day.  Follow pressures.  Send in readings.  Check metabolic panel.  ?

## 2021-05-06 NOTE — Patient Instructions (Signed)
Increase micardis to '80mg'$  per day.   ?

## 2021-05-06 NOTE — Progress Notes (Signed)
Patient ID: Theresa Francis, female   DOB: September 11, 1950, 71 y.o.   MRN: [id number] ? ? ?Subjective:  ? ? Patient ID: Theresa Francis, female    DOB: 1951-02-15, 71 y.o.   MRN: [id number] ? ?This visit occurred during the SARS-CoV-2 public health emergency.  Safety protocols were in place, including screening questions prior to the visit, additional usage of staff PPE, and extensive cleaning of exam room while observing appropriate contact time as indicated for disinfecting solutions.  ? ?Patient here for her physical exam.  ? ?Chief Complaint  ?Patient presents with  ? Annual Exam  ? .  ? ?HPI ?Here for physical exam. On micardis.  Blood pressure on average - upper 629U systolic readings.  Occasionally will see 765 systolic. Stays active.  No chest pain or sob reported.  No abdominal pain.  Bowels are moving better since starting benefiber.  Recent colonoscopy - polyps - recommended f/u in 3 years.  Overall feels good.   ? ? ?Past Medical History:  ?Diagnosis Date  ? Abnormal Pap smear   ? s/p conization.   ? Basal cell carcinoma   ? eye lid  ? History of chicken pox   ? History of shingles   ? Osteoarthritis   ? Urine incontinence   ? H/O occasional  ? ?Past Surgical History:  ?Procedure Laterality Date  ? BREAST BIOPSY Left 11/03/2014  ? neg./ done by Dr. Bary Castilla in office  ? BREAST CYST ASPIRATION Left   ? COLONOSCOPY  2008  ? Dr Sonny Masters  ? COLONOSCOPY WITH PROPOFOL N/A 11/27/2019  ? Procedure: COLONOSCOPY WITH PROPOFOL;  Surgeon: Virgel Manifold, MD;  Location: [facility] ENDOSCOPY;  Service: Endoscopy;  Laterality: N/A;  PATIENT COVID + on 9/03/202;   COPY ON CHART  ? 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 FINE NEEDLE ASPIRATION Left 2010  ? Dr Bary Castilla  ? TONSILLECTOMY  1967  ? ?Family History  ?Problem Relation Age of Onset  ? Breast cancer Mother 79  ? Heart disease Father   ? Breast cancer Sister   ?     angiosarcoma  ? ?Social History  ? ?Socioeconomic History  ? Marital status: Married  ?  Spouse  name: Not on file  ? Number of children: 3  ? Years of education: Not on file  ? Highest education level: Not on file  ?Occupational History  ? Not on file  ?Tobacco Use  ? Smoking status: Never  ? Smokeless tobacco: Never  ?Vaping Use  ? Vaping Use: Never used  ?Substance and Sexual Activity  ? Alcohol use: Yes  ?  Alcohol/week: 0.0 standard drinks  ?  Comment: drinks wine occasionally  ? Drug use: No  ? Sexual activity: Not on file  ?Other Topics Concern  ? Not on file  ?Social History Narrative  ? Not on file  ? ?Social Determinants of Health  ? ?Financial Resource Strain: Low Risk   ? Difficulty of Paying Living Expenses: Not hard at all  ?Food Insecurity: No Food Insecurity  ? Worried About Charity fundraiser in the Last Year: Never true  ? Ran Out of Food in the Last Year: Never true  ?Transportation Needs: No Transportation Needs  ? Lack of Transportation (Medical): No  ? Lack of Transportation (Non-Medical): No  ?Physical Activity: Sufficiently Active  ? Days of Exercise per Week: 3 days  ? Minutes of Exercise per Session: 60 min  ?Stress: No Stress Concern  Present  ? Feeling of Stress : Not at all  ?Social Connections: Unknown  ? Frequency of Communication with Friends and Family: More than three times a week  ? Frequency of Social Gatherings with Friends and Family: More than three times a week  ? Attends Religious Services: Not on file  ? Active Member of Clubs or Organizations: Not on file  ? Attends Archivist Meetings: Not on file  ? Marital Status: Not on file  ? ? ? ?Review of Systems  ?Constitutional:  Negative for appetite change and unexpected weight change.  ?HENT:  Negative for congestion, sinus pressure and sore throat.   ?Eyes:  Negative for pain and visual disturbance.  ?Respiratory:  Negative for cough, chest tightness and shortness of breath.   ?Cardiovascular:  Negative for chest pain, palpitations and leg swelling.  ?Gastrointestinal:  Negative for abdominal pain, diarrhea,  nausea and vomiting.  ?Genitourinary:  Negative for difficulty urinating and dysuria.  ?Musculoskeletal:  Negative for joint swelling and myalgias.  ?Skin:  Negative for color change and rash.  ?Neurological:  Negative for dizziness, light-headedness and headaches.  ?Hematological:  Negative for adenopathy. Does not bruise/bleed easily.  ?Psychiatric/Behavioral:  Negative for agitation and dysphoric mood.   ? ?   ?Objective:  ?  ? ?BP 134/70   Pulse 78   Temp 97.9 ?F (36.6 ?C)   Resp 16   Ht '5\' 4"'$  (1.626 m)   Wt 145 lb 9.6 oz (66 kg)   SpO2 98%   BMI 24.99 kg/m?  ?Wt Readings from Last 3 Encounters:  ?05/06/21 145 lb 9.6 oz (66 kg)  ?01/04/21 141 lb 3.2 oz (64 kg)  ?11/04/20 138 lb (62.6 kg)  ? ? ?Physical Exam ?Vitals reviewed.  ?Constitutional:   ?   General: She is not in acute distress. ?   Appearance: Normal appearance. She is well-developed.  ?HENT:  ?   Head: Normocephalic and atraumatic.  ?   Right Ear: External ear normal.  ?   Left Ear: External ear normal.  ?Eyes:  ?   General: No scleral icterus.    ?   Right eye: No discharge.     ?   Left eye: No discharge.  ?   Conjunctiva/sclera: Conjunctivae normal.  ?Neck:  ?   Thyroid: No thyromegaly.  ?Cardiovascular:  ?   Rate and Rhythm: Normal rate and regular rhythm.  ?Pulmonary:  ?   Effort: No tachypnea, accessory muscle usage or respiratory distress.  ?   Breath sounds: Normal breath sounds. No decreased breath sounds or wheezing.  ?Chest:  ?Breasts: ?   Right: No inverted nipple, mass, nipple discharge or tenderness (no axillary adenopathy).  ?   Left: No inverted nipple, mass, nipple discharge or tenderness (no axilarry adenopathy).  ?Abdominal:  ?   General: Bowel sounds are normal.  ?   Palpations: Abdomen is soft.  ?   Tenderness: There is no abdominal tenderness.  ?Musculoskeletal:     ?   General: No swelling or tenderness.  ?   Cervical back: Neck supple. No tenderness.  ?Lymphadenopathy:  ?   Cervical: No cervical adenopathy.  ?Skin: ?    Findings: No erythema or rash.  ?Neurological:  ?   Mental Status: She is alert and oriented to person, place, and time.  ?Psychiatric:     ?   Mood and Affect: Mood normal.     ?   Behavior: Behavior normal.  ? ? ? ?Outpatient Encounter Medications  as of 05/06/2021  ?Medication Sig  ? telmisartan (MICARDIS) 80 MG tablet Take 1 tablet (80 mg total) by mouth daily.  ? acetaminophen (TYLENOL) 650 MG CR tablet Take 1,300 mg by mouth every 6 (six) hours as needed for pain.  ? Calcium Carbonate-Vitamin D (CALTRATE 600+D PO) Take by mouth daily.  ? Cholecalciferol 50 MCG (2000 UT) CAPS Take 1 capsule by mouth daily.  ? ondansetron (ZOFRAN) 4 MG tablet TAKE ONE TABLET BY MOUTH TWICE DAILY AS NEEDED FOR NAUSEA OR VOMITING  ? rosuvastatin (CRESTOR) 5 MG tablet TAKE 1 TABLET BY MOUTH EVERY DAY  ? sertraline (ZOLOFT) 50 MG tablet TAKE 1 TABLET BY MOUTH DAILY  ? vitamin B-12 (CYANOCOBALAMIN) 100 MCG tablet Take 100 mcg by mouth daily.  ? [DISCONTINUED] telmisartan (MICARDIS) 40 MG tablet TAKE 1 TABLET BY MOUTH DAILY.  ? ?No facility-administered encounter medications on file as of 05/06/2021.  ?  ? ?Lab Results  ?Component Value Date  ? WBC 6.6 05/06/2021  ? HGB 12.9 05/06/2021  ? HCT 38.6 05/06/2021  ? PLT 316.0 05/06/2021  ? GLUCOSE 98 05/06/2021  ? CHOL 182 05/06/2021  ? TRIG 80.0 05/06/2021  ? HDL 69.90 05/06/2021  ? LDLDIRECT 130.6 11/26/2012  ? [place] 96 05/06/2021  ? ALT 19 05/06/2021  ? AST 20 05/06/2021  ? NA 139 05/06/2021  ? K 4.2 05/06/2021  ? CL 104 05/06/2021  ? CREATININE 0.70 05/06/2021  ? BUN 16 05/06/2021  ? CO2 27 05/06/2021  ? TSH 1.76 05/06/2021  ? HGBA1C 5.9 05/06/2021  ? ? ?MM 3D SCREEN BREAST BILATERAL ? ?Result Date: 12/16/2020 ?CLINICAL DATA:  Screening. EXAM: DIGITAL SCREENING BILATERAL MAMMOGRAM WITH TOMOSYNTHESIS AND CAD TECHNIQUE: Bilateral screening digital craniocaudal and mediolateral oblique mammograms were obtained. Bilateral screening digital breast tomosynthesis was performed. The images were  evaluated with computer-aided detection. COMPARISON:  Previous exam(s). ACR Breast Density Category b: There are scattered areas of fibroglandular density. FINDINGS: There are no findings suspicious for maligna

## 2021-05-08 ENCOUNTER — Encounter: Payer: Self-pay | Admitting: Internal Medicine

## 2021-05-08 NOTE — Assessment & Plan Note (Signed)
Appears to be handling stress relatively well.  Continues on Zoloft.  No changes.  Follow-up. ?

## 2021-05-08 NOTE — Assessment & Plan Note (Signed)
Low-carb diet and exercise.  Follow [place] and A1c.  ?Lab Results  ?Component Value Date  ? HGBA1C 5.9 05/06/2021  ? ?

## 2021-05-08 NOTE — Assessment & Plan Note (Addendum)
Continue crestor.  Low cholesterol diet and exercise. Follow lipid panel and liver function tests.   

## 2021-05-08 NOTE — Assessment & Plan Note (Signed)
Evaluated by GI 2022.  Recommended follow-up ultrasound in 2 years. ?

## 2021-05-11 ENCOUNTER — Telehealth: Payer: Self-pay

## 2021-05-11 NOTE — Telephone Encounter (Signed)
-----   Message from Einar Pheasant, MD sent at 05/08/2021  2:41 PM EDT ----- ?Notify Theresa Francis that her cholesterol levels are ok.  Relatively stable from previous checks.  Given cholesterol guidelines, would like to increase crestor.  Confirm she is on '5mg'$  q day.  If so and agreeable, increase crestor to '10mg'$  q day.  Overall sugar control improved.  Hgb, thyroid test, kidney function tests and liver function tests are wnl.  ?

## 2021-05-11 NOTE — Telephone Encounter (Signed)
LMTCB

## 2021-05-24 ENCOUNTER — Telehealth: Payer: Medicare PPO | Admitting: Nurse Practitioner

## 2021-05-24 DIAGNOSIS — J40 Bronchitis, not specified as acute or chronic: Secondary | ICD-10-CM | POA: Diagnosis not present

## 2021-05-24 DIAGNOSIS — R051 Acute cough: Secondary | ICD-10-CM | POA: Diagnosis not present

## 2021-05-25 ENCOUNTER — Other Ambulatory Visit: Payer: Self-pay

## 2021-05-25 ENCOUNTER — Telehealth: Payer: Self-pay

## 2021-05-25 MED ORDER — ROSUVASTATIN CALCIUM 10 MG PO TABS: 10.0000 mg | ORAL_TABLET | Freq: Every day | ORAL | 1 refills | Status: DC

## 2021-05-25 MED ORDER — BENZONATATE 100 MG PO CAPS: 100.0000 mg | ORAL_CAPSULE | Freq: Three times a day (TID) | ORAL | 0 refills | Status: DC | PRN

## 2021-05-25 MED ORDER — AZITHROMYCIN 250 MG PO TABS: ORAL_TABLET | ORAL | 0 refills | Status: AC

## 2021-05-25 NOTE — Telephone Encounter (Signed)
Crestor '10mg'$  added to pt med list ?

## 2021-05-25 NOTE — Progress Notes (Signed)
We are sorry that you are not feeling well.  Here is how we plan to help! ? ?Based on your presentation I believe you most likely have A cough due to bacteria.  When patients have a fever and a productive cough with a change in color or increased sputum production, we are concerned about bacterial bronchitis.  If left untreated it can progress to pneumonia.  If your symptoms do not improve with your treatment plan it is important that you contact your provider.   I have prescribed Azithromyin 250 mg: two tablets now and then one tablet daily for 4 additonal days  ?  ?In addition you may use A prescription cough medication called Tessalon Perles 100mg. You may take 1-2 capsules every 8 hours as needed for your cough. ? ? ?From your responses in the eVisit questionnaire you describe inflammation in the upper respiratory tract which is causing a significant cough.  This is commonly called Bronchitis and has four common causes:   ?Allergies ?Viral Infections ?Acid Reflux ?Bacterial Infection ?Allergies, viruses and acid reflux are treated by controlling symptoms or eliminating the cause. An example might be a cough caused by taking certain blood pressure medications. You stop the cough by changing the medication. Another example might be a cough caused by acid reflux. Controlling the reflux helps control the cough. ? ?USE OF BRONCHODILATOR ("RESCUE") INHALERS: ?There is a risk from using your bronchodilator too frequently.  The risk is that over-reliance on a medication which only relaxes the muscles surrounding the breathing tubes can reduce the effectiveness of medications prescribed to reduce swelling and congestion of the tubes themselves.  Although you feel brief relief from the bronchodilator inhaler, your asthma may actually be worsening with the tubes becoming more swollen and filled with mucus.  This can delay other crucial treatments, such as oral steroid medications. If you need to use a bronchodilator  inhaler daily, several times per day, you should discuss this with your provider.  There are probably better treatments that could be used to keep your asthma under control.  ?   ?HOME CARE ?Only take medications as instructed by your medical team. ?Complete the entire course of an antibiotic. ?Drink plenty of fluids and get plenty of rest. ?Avoid close contacts especially the very young and the elderly ?Cover your mouth if you cough or cough into your sleeve. ?Always remember to wash your hands ?A steam or ultrasonic humidifier can help congestion.  ? ?GET HELP RIGHT AWAY IF: ?You develop worsening fever. ?You become short of breath ?You cough up blood. ?Your symptoms persist after you have completed your treatment plan ?MAKE SURE YOU  ?Understand these instructions. ?Will watch your condition. ?Will get help right away if you are not doing well or get worse. ?  ? ?Thank you for choosing an e-visit. ? ?Your e-visit answers were reviewed by a board certified advanced clinical practitioner to complete your personal care plan. Depending upon the condition, your plan could have included both over the counter or prescription medications. ? ?Please review your pharmacy choice. Make sure the pharmacy is open so you can pick up prescription now. If there is a problem, you may contact your provider through MyChart messaging and have the prescription routed to another pharmacy.  Your safety is important to us. If you have drug allergies check your prescription carefully.  ? ?For the next 24 hours you can use MyChart to ask questions about today's visit, request a non-urgent call back, or ask   for a work or school excuse. ?You will get an email in the next two days asking about your experience. I hope that your e-visit has been valuable and will speed your recovery.  ? ?I spent approximately 7 minutes reviewing the patient's history, current symptoms and coordinating their plan of care today.   ? ?Meds ordered this encounter   ?Medications  ? azithromycin (ZITHROMAX) 250 MG tablet  ?  Sig: Take 2 tablets on day 1, then 1 tablet daily on days 2 through 5  ?  Dispense:  6 tablet  ?  Refill:  0  ? benzonatate (TESSALON) 100 MG capsule  ?  Sig: Take 1 capsule (100 mg total) by mouth 3 (three) times daily as needed.  ?  Dispense:  30 capsule  ?  Refill:  0  ?  ?

## 2021-06-01 ENCOUNTER — Ambulatory Visit: Payer: Medicare PPO | Admitting: Gastroenterology

## 2021-06-03 ENCOUNTER — Encounter: Payer: Self-pay | Admitting: Internal Medicine

## 2021-06-04 ENCOUNTER — Ambulatory Visit: Payer: Medicare PPO | Admitting: Family Medicine

## 2021-06-04 ENCOUNTER — Encounter: Payer: Self-pay | Admitting: Family Medicine

## 2021-06-04 DIAGNOSIS — J0100 Acute maxillary sinusitis, unspecified: Secondary | ICD-10-CM

## 2021-06-04 MED ORDER — AMOXICILLIN-POT CLAVULANATE 875-125 MG PO TABS: 1.0000 | ORAL_TABLET | Freq: Two times a day (BID) | ORAL | 0 refills | Status: DC

## 2021-06-04 NOTE — Patient Instructions (Signed)
Nice to see you. ?You likely have a sinus infection.  We will treat you with Augmentin.  If you develop diarrhea with this please let us know.  If you are not improving over the next 3 to 5 days please let us know as well. ?

## 2021-06-04 NOTE — Progress Notes (Signed)
?Tommi Rumps, MD ?Phone: [phone] ? ?Theresa Francis is a 71 y.o. female who presents today for same-day visit. ? ?Upper respiratory infection: Patient notes her symptoms started with a sore throat on 05/21/2021.  She then developed congestion in the back of her throat.  She had cough and postnasal drip.  She completed an E-visit and was placed on azithromycin and Tessalon.  She did note her symptoms improved some though her sinus congestion has worsened in the last several days.  She notes no shortness of breath.  No loss of taste or smell.  She does report COVID exposure initially though she had multiple negative home COVID tests. ? ?Social History  ? ?Tobacco Use  ?Smoking Status Never  ?Smokeless Tobacco Never  ? ? ?Current Outpatient Medications on File Prior to Visit  ?Medication Sig Dispense Refill  ? acetaminophen (TYLENOL) 650 MG CR tablet Take 1,300 mg by mouth every 6 (six) hours as needed for pain.    ? benzonatate (TESSALON) 100 MG capsule Take 1 capsule (100 mg total) by mouth 3 (three) times daily as needed. 30 capsule 0  ? Calcium Carbonate-Vitamin D (CALTRATE 600+D PO) Take by mouth daily.    ? Cholecalciferol 50 MCG (2000 UT) CAPS Take 1 capsule by mouth daily.    ? ondansetron (ZOFRAN) 4 MG tablet TAKE ONE TABLET BY MOUTH TWICE DAILY AS NEEDED FOR NAUSEA OR VOMITING 15 tablet 0  ? rosuvastatin (CRESTOR) 10 MG tablet Take 1 tablet (10 mg total) by mouth daily. One tablet by mouth daily 90 tablet 1  ? sertraline (ZOLOFT) 50 MG tablet TAKE 1 TABLET BY MOUTH DAILY 30 tablet 2  ? telmisartan (MICARDIS) 80 MG tablet Take 1 tablet (80 mg total) by mouth daily. 30 tablet 3  ? vitamin B-12 (CYANOCOBALAMIN) 100 MCG tablet Take 100 mcg by mouth daily.    ? ?No current facility-administered medications on file prior to visit.  ? ? ? ?ROS see history of present illness ? ?Objective ? ?Physical Exam ?Vitals:  ? 06/04/21 1333  ?BP: 122/78  ?Pulse: 90  ?Temp: 98.3 ?F (36.8 ?C)  ?SpO2: 98%  ? ? ?BP  Readings from Last 3 Encounters:  ?06/04/21 122/78  ?05/06/21 134/70  ?01/04/21 125/70  ? ?Wt Readings from Last 3 Encounters:  ?06/04/21 144 lb 12.8 oz (65.7 kg)  ?05/06/21 145 lb 9.6 oz (66 kg)  ?01/04/21 141 lb 3.2 oz (64 kg)  ? ? ?Physical Exam ?Constitutional:   ?   General: She is not in acute distress. ?   Appearance: She is not diaphoretic.  ?HENT:  ?   Right Ear: Tympanic membrane normal.  ?   Left Ear: Tympanic membrane normal.  ?   Mouth/Throat:  ?   Mouth: Mucous membranes are moist.  ?   Pharynx: Oropharynx is clear.  ?Cardiovascular:  ?   Rate and Rhythm: Normal rate and regular rhythm.  ?   Heart sounds: Normal heart sounds.  ?Pulmonary:  ?   Effort: Pulmonary effort is normal.  ?   Breath sounds: Normal breath sounds.  ?Skin: ?   General: Skin is warm and dry.  ?Neurological:  ?   Mental Status: She is alert.  ? ? ? ?Assessment/Plan: Please see individual problem list. ? ?Problem List Items Addressed This Visit   ? ? Maxillary sinusitis, acute  ?  At this point her symptoms are most consistent with a bacterial sinusitis.  I did discuss that she likely had a virus initially she  could have had COVID despite her negative home COVID tests.  We will treat with Augmentin 1 tablet twice daily for 7 days.  Discussed monitoring for diarrhea.  If she is not improving over the next 3 to 5 days she will let us know.  If she develops any shortness of breath or fevers of 103 ?F or higher she will seek medical attention.  Advised if she has any worsening symptoms over the weekend she should be reevaluated. ? ?  ?  ? Relevant Medications  ? amoxicillin-clavulanate (AUGMENTIN) 875-125 MG tablet  ? ? ? ? ?Return if symptoms worsen or fail to improve. ? ?This visit occurred during the SARS-CoV-2 public health emergency.  Safety protocols were in place, including screening questions prior to the visit, additional usage of staff PPE, and extensive cleaning of exam room while observing appropriate contact time as  indicated for disinfecting solutions.  ? ? ?Tommi Rumps, MD ?[place] ? ?

## 2021-06-04 NOTE — Assessment & Plan Note (Signed)
At this point her symptoms are most consistent with a bacterial sinusitis.  I did discuss that she likely had a virus initially she could have had COVID despite her negative home COVID tests.  We will treat with Augmentin 1 tablet twice daily for 7 days.  Discussed monitoring for diarrhea.  If she is not improving over the next 3 to 5 days she will let us know.  If she develops any shortness of breath or fevers of 103 ?F or higher she will seek medical attention.  Advised if she has any worsening symptoms over the weekend she should be reevaluated. ?

## 2021-06-29 ENCOUNTER — Other Ambulatory Visit: Payer: Self-pay | Admitting: Family

## 2021-07-22 ENCOUNTER — Other Ambulatory Visit: Payer: Self-pay | Admitting: Internal Medicine

## 2021-08-12 ENCOUNTER — Other Ambulatory Visit: Payer: Self-pay | Admitting: Family

## 2021-09-06 ENCOUNTER — Ambulatory Visit: Payer: Medicare PPO | Admitting: Internal Medicine

## 2021-09-13 ENCOUNTER — Ambulatory Visit: Payer: Medicare PPO | Admitting: Internal Medicine

## 2021-09-13 ENCOUNTER — Encounter: Payer: Self-pay | Admitting: Internal Medicine

## 2021-09-13 DIAGNOSIS — R7989 Other specified abnormal findings of blood chemistry: Secondary | ICD-10-CM

## 2021-09-13 DIAGNOSIS — R739 Hyperglycemia, unspecified: Secondary | ICD-10-CM | POA: Diagnosis not present

## 2021-09-13 DIAGNOSIS — Z809 Family history of malignant neoplasm, unspecified: Secondary | ICD-10-CM

## 2021-09-13 DIAGNOSIS — F439 Reaction to severe stress, unspecified: Secondary | ICD-10-CM | POA: Diagnosis not present

## 2021-09-13 DIAGNOSIS — I10 Essential (primary) hypertension: Secondary | ICD-10-CM

## 2021-09-13 DIAGNOSIS — Z860100 Personal history of colon polyps, unspecified: Secondary | ICD-10-CM

## 2021-09-13 DIAGNOSIS — E7800 Pure hypercholesterolemia, unspecified: Secondary | ICD-10-CM

## 2021-09-13 DIAGNOSIS — Z1231 Encounter for screening mammogram for malignant neoplasm of breast: Secondary | ICD-10-CM

## 2021-09-13 DIAGNOSIS — K824 Cholesterolosis of gallbladder: Secondary | ICD-10-CM

## 2021-09-13 LAB — HEMOGLOBIN A1C: Hgb A1c MFr Bld: 5.9 % (ref 4.6–6.5)

## 2021-09-13 LAB — HEPATIC FUNCTION PANEL
ALT: 18 U/L (ref 0–35)
AST: 20 U/L (ref 0–37)
Albumin: 4.7 g/dL (ref 3.5–5.2)
Alkaline Phosphatase: 75 U/L (ref 39–117)
Bilirubin, Direct: 0.2 mg/dL (ref 0.0–0.3)
Total Bilirubin: 1.1 mg/dL (ref 0.2–1.2)
Total Protein: 6.6 g/dL (ref 6.0–8.3)

## 2021-09-13 LAB — BASIC METABOLIC PANEL
BUN: 13 mg/dL (ref 6–23)
CO2: 25 mEq/L (ref 19–32)
Calcium: 9.2 mg/dL (ref 8.4–10.5)
Chloride: 106 mEq/L (ref 96–112)
Creatinine, Ser: 0.73 mg/dL (ref 0.40–1.20)
GFR: 82.48 mL/min (ref >60.00)
Glucose, Bld: 97 mg/dL (ref 70–99)
Potassium: 4.1 mEq/L (ref 3.5–5.1)
Sodium: 140 mEq/L (ref 135–145)

## 2021-09-13 LAB — LIPID PANEL
Cholesterol: 141 mg/dL (ref 0–200)
HDL: 57.8 mg/dL (ref >39.00)
LDL Cholesterol: 70 mg/dL (ref 0–99)
NonHDL: 83.18
Total CHOL/HDL Ratio: 2
Triglycerides: 68 mg/dL (ref 0.0–149.0)
VLDL: 13.6 mg/dL (ref 0.0–40.0)

## 2021-09-13 MED ORDER — SERTRALINE HCL 50 MG PO TABS: 50.0000 mg | ORAL_TABLET | Freq: Every day | ORAL | 2 refills | Status: DC

## 2021-09-13 NOTE — Progress Notes (Unsigned)
Patient ID: Theresa Francis, female   DOB: 1951-02-10, 71 y.o.   MRN: [id number]   Subjective:    Patient ID: Theresa Francis, female    DOB: [birth year], 71 y.o.   MRN: [id number]   Patient here for a scheduled follow up.   Chief Complaint  Patient presents with   Hypertension   .   Hypertension Pertinent negatives include no chest pain, headaches, palpitations or shortness of breath.   Increased stress recently.  Sister is in Hospice.  Discussed.  On sertraline.  Does not feel needs anything more at this time, but will notify me if she does.  Stays active.  No chest pain.  No sob.  No acid reflux.  No abdominal pain.  Bowels moving.  Blood pressures on outside checks 128-132/60-70s.  Discussed genetic testing - mother and sister with breast cancer.  Sister with angiosarcoma.  Also discussed gall bladder polyp - f/u ultrasound.     Past Medical History:  Diagnosis Date   Abnormal Pap smear    s/p conization.    Basal cell carcinoma    eye lid   History of chicken pox    History of shingles    Osteoarthritis    Urine incontinence    H/O occasional   Past Surgical History:  Procedure Laterality Date   BREAST BIOPSY Left 11/03/2014   neg./ done by Dr. 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   FINE NEEDLE ASPIRATION Left 2010   Dr Bary Castilla   TONSILLECTOMY  1967   Family History  Problem Relation Age of Onset   Breast cancer Mother 65   Heart disease Father    Breast cancer Sister        angiosarcoma   Social History   Socioeconomic History   Marital status: Married    Spouse name: Not on file   Number of children: 3   Years of education: Not on  file   Highest education level: Not on file  Occupational History   Not on file  Tobacco Use   Smoking status: Never   Smokeless tobacco: Never  Vaping Use   Vaping Use: Never used  Substance and Sexual Activity   Alcohol use: Yes    Alcohol/week: 0.0 standard drinks of alcohol    Comment: drinks wine occasionally   Drug use: No   Sexual activity: Not on file  Other Topics Concern   Not on file  Social History Narrative   Not on file   Social Determinants of Health   Financial Resource Strain: Low Risk  (11/04/2020)   Overall Financial Resource Strain (CARDIA)    Difficulty of Paying Living Expenses: Not hard at all  Food Insecurity: No Food Insecurity (11/04/2020)   Hunger Vital Sign    Worried About Running Out of Food in the Last Year: Never true    Ran Out of Food in the Last Year: Never true  Transportation Needs: No Transportation Needs (11/04/2020)   PRAPARE - Hydrologist (Medical): No    Lack of Transportation (Non-Medical): No  Physical Activity: Sufficiently Active (11/04/2020)  Exercise Vital Sign    Days of Exercise per Week: 3 days    Minutes of Exercise per Session: 60 min  Stress: No Stress Concern Present (11/04/2020)   [place]    Feeling of Stress : Not at all  Social Connections: Unknown (11/04/2020)   Social Connection and Isolation Panel [NHANES]    Frequency of Communication with Friends and Family: More than three times a week    Frequency of Social Gatherings with Friends and Family: More than three times a week    Attends Religious Services: Not on Advertising copywriter or Organizations: Not on file    Attends Archivist Meetings: Not on file    Marital Status: Not on file     Review of Systems  Constitutional:  Negative for appetite change and unexpected weight change.  HENT:  Negative for congestion and sinus pressure.    Respiratory:  Negative for cough, chest tightness and shortness of breath.   Cardiovascular:  Negative for chest pain, palpitations and leg swelling.  Gastrointestinal:  Negative for abdominal pain, diarrhea, nausea and vomiting.  Genitourinary:  Negative for difficulty urinating and dysuria.  Musculoskeletal:  Negative for joint swelling and myalgias.  Skin:  Negative for color change and rash.  Neurological:  Negative for dizziness, light-headedness and headaches.  Psychiatric/Behavioral:  Negative for agitation and dysphoric mood.        Objective:     BP 140/74 (BP Location: Left Arm, Patient Position: Sitting, Cuff Size: Small)   Pulse 83   Temp 97.8 F (36.6 C) (Temporal)   Resp 18   Ht '5\' 4"'$  (1.626 m)   Wt 143 lb 3.2 oz (65 kg)   SpO2 98%   BMI 24.58 kg/m  Wt Readings from Last 3 Encounters:  09/13/21 143 lb 3.2 oz (65 kg)  06/04/21 144 lb 12.8 oz (65.7 kg)  05/06/21 145 lb 9.6 oz (66 kg)    Physical Exam Vitals reviewed.  Constitutional:      General: She is not in acute distress.    Appearance: Normal appearance.  HENT:     Head: Normocephalic and atraumatic.     Right Ear: External ear normal.     Left Ear: External ear normal.  Eyes:     General: No scleral icterus.       Right eye: No discharge.        Left eye: No discharge.     Conjunctiva/sclera: Conjunctivae normal.  Neck:     Thyroid: No thyromegaly.  Cardiovascular:     Rate and Rhythm: Normal rate and regular rhythm.  Pulmonary:     Effort: No respiratory distress.     Breath sounds: Normal breath sounds. No wheezing.  Abdominal:     General: Bowel sounds are normal.     Palpations: Abdomen is soft.     Tenderness: There is no abdominal tenderness.  Musculoskeletal:        General: No swelling or tenderness.     Cervical back: Neck supple. No tenderness.  Lymphadenopathy:     Cervical: No cervical adenopathy.  Skin:    Findings: No erythema or rash.  Neurological:     Mental Status:  She is alert.  Psychiatric:        Mood and Affect: Mood normal.        Behavior: Behavior normal.      Outpatient Encounter Medications as of 09/13/2021  Medication Sig   acetaminophen (TYLENOL) 650 MG CR tablet Take 1,300 mg by mouth every 6 (six) hours as needed for pain.   benzonatate (TESSALON) 100 MG capsule Take 1 capsule (100 mg total) by mouth 3 (three) times daily as needed.   Calcium Carbonate-Vitamin D (CALTRATE 600+D PO) Take by mouth daily.   Cholecalciferol 50 MCG (2000 UT) CAPS Take 1 capsule by mouth daily.   ondansetron (ZOFRAN) 4 MG tablet TAKE ONE TABLET BY MOUTH TWICE DAILY AS NEEDED FOR NAUSEA OR VOMITING   rosuvastatin (CRESTOR) 10 MG tablet Take 1 tablet (10 mg total) by mouth daily. One tablet by mouth daily   sertraline (ZOLOFT) 50 MG tablet TAKE 1 TABLET BY MOUTH DAILY   telmisartan (MICARDIS) 80 MG tablet TAKE ONE TABLET BY MOUTH EVERY DAY   vitamin B-12 (CYANOCOBALAMIN) 100 MCG tablet Take 100 mcg by mouth daily.   [DISCONTINUED] amoxicillin-clavulanate (AUGMENTIN) 875-125 MG tablet Take 1 tablet by mouth 2 (two) times daily.   No facility-administered encounter medications on file as of 09/13/2021.     Lab Results  Component Value Date   WBC 6.6 05/06/2021   HGB 12.9 05/06/2021   HCT 38.6 05/06/2021   PLT 316.0 05/06/2021   GLUCOSE 98 05/06/2021   CHOL 182 05/06/2021   TRIG 80.0 05/06/2021   HDL 69.90 05/06/2021   LDLDIRECT 130.6 11/26/2012   LDLCALC 96 05/06/2021   ALT 19 05/06/2021   AST 20 05/06/2021   NA 139 05/06/2021   K 4.2 05/06/2021   CL 104 05/06/2021   CREATININE 0.70 05/06/2021   BUN 16 05/06/2021   CO2 27 05/06/2021   TSH 1.76 05/06/2021   HGBA1C 5.9 05/06/2021    MM 3D SCREEN BREAST BILATERAL  Result Date: 12/16/2020 CLINICAL DATA:  Screening. EXAM: DIGITAL SCREENING BILATERAL MAMMOGRAM WITH TOMOSYNTHESIS AND CAD TECHNIQUE: Bilateral screening digital craniocaudal and mediolateral oblique mammograms were obtained. Bilateral  screening digital breast tomosynthesis was performed. The images were evaluated with computer-aided detection. COMPARISON:  Previous exam(s). ACR Breast Density Category b: There are scattered areas of fibroglandular density. FINDINGS: There are no findings suspicious for malignancy. IMPRESSION: No mammographic evidence of malignancy. A result letter of this screening mammogram will be mailed directly to the patient. RECOMMENDATION: Screening mammogram in one year. (Code:SM-B-01Y) BI-RADS CATEGORY  1: Negative. Electronically Signed   By: Ammie Ferrier M.D.   On: 12/16/2020 14:11      Assessment & Plan:   Problem List Items Addressed This Visit     Hypercholesterolemia   Relevant Orders   Hepatic function panel   Lipid Profile   Hyperglycemia   Relevant Orders   HgB A1c   Hypertension   Relevant Orders   Basic Metabolic Panel (BMET)   Other Visit Diagnoses     Encounter for screening mammogram for malignant neoplasm of breast    -  Primary   Relevant Orders   MM 3D SCREEN BREAST BILATERAL        Einar Pheasant, MD

## 2021-09-14 ENCOUNTER — Encounter: Payer: Self-pay | Admitting: Internal Medicine

## 2021-09-14 DIAGNOSIS — Z809 Family history of malignant neoplasm, unspecified: Secondary | ICD-10-CM | POA: Insufficient documentation

## 2021-09-14 NOTE — Assessment & Plan Note (Signed)
Evaluated by GI 2022.  Recommended follow-up ultrasound in 2 years. Sister recently diagnosed with cancer.  Will recheck ultrasound to confirm gallbladder polyp stable.

## 2021-09-14 NOTE — Assessment & Plan Note (Signed)
Continue crestor.  Low cholesterol diet and exercise. Follow lipid panel and liver function tests.   

## 2021-09-14 NOTE — Assessment & Plan Note (Signed)
Has had slightly elevated bilirubin previously.  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  Recheck ultrasound to confirm stable.

## 2021-09-14 NOTE — Assessment & Plan Note (Signed)
Colonoscopy 03/2021 - One 12 mm polyp in the ascending colon, removed with mucosal resection. Resected and retrieved.  Post mucosectomy scar in the ascending colon.  One 10 mm polyp in the descending colon, removed with a cold snare. Resected and retrieved.  Diverticulosis in the sigmoid colon.  Internal hemorrhoids. Mucosal resection was performed. Resection and retrieval were complete.  Recommended f/u colonoscopy in 3 years.   

## 2021-09-14 NOTE — Assessment & Plan Note (Signed)
Blood pressure as outlined.  Recheck wnl.  On micardis 80mg q day.  Follow pressures.  Check metabolic panel.  

## 2021-09-14 NOTE — Assessment & Plan Note (Signed)
Discussed: sister with breast cancer, sister - angiosarcoma and mother with breast cancer.  Discussed genetic testing.  Agreeable.

## 2021-09-14 NOTE — Assessment & Plan Note (Signed)
Low-carb diet and exercise.  Follow [place] and A1c.  Lab Results  Component Value Date   HGBA1C 5.9 09/13/2021

## 2021-09-14 NOTE — Assessment & Plan Note (Signed)
Appears to be handling stress relatively well.  Continues on Zoloft.  No changes.  Follow.  

## 2021-09-15 ENCOUNTER — Telehealth: Payer: Self-pay | Admitting: Genetic Counselor

## 2021-09-15 NOTE — Telephone Encounter (Signed)
Scheduled appt per 7/25 referral. Pt is aware of appt date and time. Pt is aware to arrive 15 mins prior to appt time and to bring and updated insurance card. Pt is aware of appt location.

## 2021-09-21 ENCOUNTER — Telehealth: Payer: Self-pay

## 2021-09-21 NOTE — Telephone Encounter (Signed)
Pt advised mammo sched 10/25 at 1120am

## 2021-09-24 ENCOUNTER — Ambulatory Visit
Admission: RE | Admit: 2021-09-24 | Discharge: 2021-09-24 | Disposition: A | Discharge status: Home / Self Care | Payer: Medicare PPO | Source: Ambulatory Visit | Attending: Internal Medicine | Admitting: Internal Medicine

## 2021-09-24 DIAGNOSIS — K824 Cholesterolosis of gallbladder: Secondary | ICD-10-CM | POA: Insufficient documentation

## 2021-09-27 ENCOUNTER — Telehealth: Payer: Self-pay

## 2021-09-27 NOTE — Telephone Encounter (Signed)
Patient returned your call , She will be around her phone until around 1:00.

## 2021-09-27 NOTE — Telephone Encounter (Signed)
LMTCB for results. 

## 2021-09-27 NOTE — Telephone Encounter (Signed)
LMTCB for this message. Okay to read to her. If ay questions can transfer back. Please call and notify abdominal ultrasound revealed gallbladder polyps - stable.  No evidence of any acute inflammation or abnormality.

## 2021-10-01 ENCOUNTER — Other Ambulatory Visit: Payer: Self-pay | Admitting: Internal Medicine

## 2021-11-04 ENCOUNTER — Inpatient Hospital Stay: Payer: Medicare PPO | Attending: Genetic Counselor | Admitting: Genetic Counselor

## 2021-11-04 ENCOUNTER — Encounter: Payer: Self-pay | Admitting: Genetic Counselor

## 2021-11-04 ENCOUNTER — Inpatient Hospital Stay: Payer: Medicare PPO

## 2021-11-04 DIAGNOSIS — Z803 Family history of malignant neoplasm of breast: Secondary | ICD-10-CM | POA: Diagnosis not present

## 2021-11-04 NOTE — Progress Notes (Signed)
REFERRING PROVIDER: Einar Pheasant, MD [address] [place],  [place] [id number]  PRIMARY PROVIDER:  Einar Pheasant, MD  PRIMARY REASON FOR VISIT:  Encounter Diagnosis  Name Primary?   Family history of breast cancer Yes   HISTORY OF PRESENT ILLNESS:   Theresa Francis, a 71 y.o. female, was seen for a [place] cancer genetics consultation at the request of Dr. Nicki Reaper due to a family history of cancer.  Theresa Francis presents to clinic today to discuss the possibility of a hereditary predisposition to cancer, to discuss genetic testing, and to further clarify her future cancer risks, as well as potential cancer risks for family members.   Theresa Francis is a 71 y.o. female with no personal history of cancer.    RISK FACTORS:  Menarche was at age 46.  First live birth at age 64.  OCP use for approximately 10 years.  Ovaries intact: yes.  Uterus intact: yes.  Menopausal status: postmenopausal.  HRT use: 1 year. Colonoscopy: yes;  history of colon polyps . Mammogram within the last year: yes. Any excessive radiation exposure in the past: no  Past Medical History:  Diagnosis Date   Abnormal Pap smear    s/p conization.    Basal cell carcinoma    eye lid   History of chicken pox    History of shingles    Osteoarthritis    Urine incontinence    H/O occasional    Past Surgical History:  Procedure Laterality Date   BREAST BIOPSY Left 11/03/2014   neg./ done by Dr. 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   FINE NEEDLE ASPIRATION Left 2010   Dr Bary Castilla   TONSILLECTOMY  1967    Social History   Socioeconomic History   Marital status:  Married    Spouse name: Not on file   Number of children: 3   Years of education: Not on file   Highest education level: Not on file  Occupational History   Not on file  Tobacco Use   Smoking status: Never   Smokeless tobacco: Never  Vaping Use   Vaping Use: Never used  Substance and Sexual Activity   Alcohol use: Yes    Alcohol/week: 0.0 standard drinks of alcohol    Comment: drinks wine occasionally   Drug use: No   Sexual activity: Not on file  Other Topics Concern   Not on file  Social History Narrative   Not on file   Social Determinants of Health   Financial Resource Strain: Low Risk  (11/04/2020)   Overall Financial Resource Strain (CARDIA)    Difficulty of Paying Living Expenses: Not hard at all  Food Insecurity: No Food Insecurity (11/04/2020)   Hunger Vital Sign    Worried About Running Out of Food in the Last Year: Never true    Ran Out of Food in the Last Year: Never true  Transportation Needs: No Transportation Needs (11/04/2020)   PRAPARE - Hydrologist (Medical): No    Lack of Transportation (Non-Medical): No  Physical Activity: Sufficiently Active (11/04/2020)   Exercise Vital Sign  Days of Exercise per Week: 3 days    Minutes of Exercise per Session: 60 min  Stress: No Stress Concern Present (11/04/2020)   [place]    Feeling of Stress : Not at all  Social Connections: Unknown (11/04/2020)   Social Connection and Isolation Panel [NHANES]    Frequency of Communication with Friends and Family: More than three times a week    Frequency of Social Gatherings with Friends and Family: More than three times a week    Attends Religious Services: Not on Advertising copywriter or Organizations: Not on file    Attends Archivist Meetings: Not on file    Marital Status: Not on file     FAMILY HISTORY:  We obtained a detailed, 4-generation family  history.  Significant diagnoses are listed below: Family History  Problem Relation Age of Onset   Breast cancer Mother 40   Heart disease Father    Breast cancer Sister 41   Cancer Sister 39       angiosarcoma   Uterine cancer Maternal Aunt 60 - 67     Theresa Francis had three sisters. One sister was diagnosed with breast cancer at age 77 and was diagnosed with recurrent breast cancer in her late 46s, the breast cancer metastasized to her lungs and she died at age 24, she reportedly had negative BRCA1/2 genetic testing. Her second sister was diagnosed with angiosarcoma of the breast at age 21, she died at age 45. Theresa Francis mother was diagnosed with breast cancer at age 61 and died at age 60 due to COPD. Her maternal aunt was diagnosed with uterine cancer in her 51s, she died due to metastatic cancer. There is no reported Ashkenazi Jewish ancestry.   GENETIC COUNSELING ASSESSMENT: Theresa Francis is a 71 y.o. female with a family history of cancer which is somewhat suggestive of a hereditary predisposition to cancer. We, therefore, discussed and recommended the following at today's visit.   DISCUSSION: We discussed that 5 - 10% of cancer is hereditary, with most cases of hereditary breast cancer associated with BRCA1/2.  There are other genes that can be associated with hereditary breast cancer syndromes.  We discussed that testing is beneficial for several reasons, including knowing about other cancer risks, identifying potential screening and risk-reduction options that may be appropriate, and to understanding if other family members could be at risk for cancer and allowing them to undergo genetic testing.  We reviewed the characteristics, features and inheritance patterns of hereditary cancer syndromes. We also discussed genetic testing, including the appropriate family members to test, the process of testing, insurance coverage and turn-around-time for results. We discussed the implications of a  negative, positive, carrier and/or variant of uncertain significant result. We discussed that negative results would be uninformative given that Theresa Francis does not have a personal history of cancer.   Theresa Francis was offered a common hereditary cancer panel (48 genes) and an expanded pan-cancer panel (77 genes). Theresa Francis was informed of the benefits and limitations of each panel, including that expanded pan-cancer panels contain several genes that do not have clear management guidelines at this point in time.  We also discussed that as the number of genes included on a panel increases, the chances of variants of uncertain significance increases.  After considering the benefits and limitations of each gene panel, Theresa Francis elected to have [employer].  The CustomNext-Cancer+RNAinsight panel  offered by [employer] [employer] includes sequencing and rearrangement analysis for the following 48 genes:  APC, ATM, AXIN2, BARD1, BMPR1A, BRCA1, BRCA2, BRIP1, CDH1, CDK4, CDKN2A, CHEK2, CTNNA1, DICER1, EPCAM, GREM1, HOXB13, KIT, MEN1, MLH1, MSH2, MSH3, MSH6, MUTYH, NBN, NF1, NTHL1, PALB2, PDGFRA, PMS2, POLD1, POLE, POT1, PTEN, RAD50, RAD51C, RAD51D, SDHA, SDHB, SDHC, SDHD, SMAD4, SMARCA4, STK11, TP53, TSC1, TSC2, and VHL.  RNA data is routinely analyzed for use in variant interpretation for all genes.  Based on Theresa Francis's family history of cancer, she does not meet medical criteria for genetic testing. She may have an out of pocket cost. We discussed that if her out of pocket cost for testing is over $100, the laboratory will call and confirm whether she wants to proceed with testing.  If the out of pocket cost of testing is less than $100 she will be billed by the genetic testing laboratory.   A federal law called the Genetic Information Non-Discrimination Act (GINA) of 2008 helps protect individuals against genetic discrimination based on their genetic test results.  It impacts both health insurance  and employment.  With health insurance, it protects against increased premiums, being kicked off insurance or being forced to take a test in order to be insured.  For employment it protects against hiring, firing and promoting decisions based on genetic test results.  GINA does not apply to those in the [employer], those who work for companies with less than 15 employees, and new life insurance or long-term disability insurance policies.  Health status due to a cancer diagnosis is not protected under GINA.  PLAN: After considering the risks, benefits, and limitations, Ms. Dejarnette provided informed consent to pursue genetic testing and the blood sample was sent to [facility] for analysis of the CustomNext Panel. Results should be available within approximately 2-3 weeks' time, at which point they will be disclosed by telephone to Theresa Francis, as will any additional recommendations warranted by these results. Theresa Francis will receive a summary of her genetic counseling visit and a copy of her results once available. This information will also be available in Epic.   Theresa Francis questions were answered to her satisfaction today. Our contact information was provided should additional questions or concerns arise. Thank you for the referral and allowing [place] to share in the care of your patient.   Lucille Passy, MS, [facility] Genetic Counselor [email or web address] (P) [phone]  The patient was seen for a total of 35 minutes in face-to-face genetic counseling. The patient was seen alone.  Drs. Lindi Adie and/or Burr Medico were available to discuss this case as needed.  _______________________________________________________________________ For Office Staff:  Number of people involved in session: 1 Was an Intern/ student involved with case: no

## 2021-11-19 ENCOUNTER — Telehealth: Payer: Self-pay | Admitting: Internal Medicine

## 2021-11-19 NOTE — Telephone Encounter (Signed)
Copied from [place] [phone] [phone]. Topic: Medicare AWV >> Nov 19, 2021 11:47 AM Devoria Glassing wrote: Reason for CRM: Left message for patient to schedule Annual Wellness Visit.  Please schedule with Nurse Health Advisor Denisa O'Brien-Blaney, LPN at [facility]. This appt can be telephone or office visit.  Please call [phone] ask for [facility]

## 2021-11-26 ENCOUNTER — Telehealth: Payer: Self-pay | Admitting: Genetic Counselor

## 2021-11-26 ENCOUNTER — Encounter: Payer: Self-pay | Admitting: Genetic Counselor

## 2021-11-26 DIAGNOSIS — Z1379 Encounter for other screening for genetic and chromosomal anomalies: Secondary | ICD-10-CM | POA: Insufficient documentation

## 2021-11-26 NOTE — Telephone Encounter (Signed)
I attempted to contact Theresa Francis to discuss her genetic testing results (48 genes). I left a voicemail requesting she call me back at [phone].  Lucille Passy, MS, [facility] Genetic Counselor [email or web address] (P) [phone]

## 2021-11-30 ENCOUNTER — Telehealth: Payer: Self-pay | Admitting: Genetic Counselor

## 2021-11-30 NOTE — Telephone Encounter (Signed)
I contacted Ms. Pritchard to discuss her genetic testing results. No pathogenic variants were identified in the 48 genes analyzed. Detailed clinic note to follow.  The test report has been scanned into EPIC and is located under the Molecular Pathology section of the Results Review tab.  A portion of the result report is included below for reference.   Casia Corti Dajaun Goldring, MS, [facility] Genetic Counselor [email or web address] (P) [phone]    

## 2021-12-01 ENCOUNTER — Telehealth: Payer: Self-pay

## 2021-12-01 ENCOUNTER — Ambulatory Visit: Payer: Self-pay | Admitting: Genetic Counselor

## 2021-12-01 ENCOUNTER — Ambulatory Visit: Payer: Medicare PPO

## 2021-12-01 DIAGNOSIS — Z1379 Encounter for other screening for genetic and chromosomal anomalies: Secondary | ICD-10-CM

## 2021-12-01 NOTE — Telephone Encounter (Signed)
No answer when called patient for scheduled AWV. Left message to reschedule.

## 2021-12-01 NOTE — Progress Notes (Addendum)
 HPI:   Ms. Aguinaga was previously seen in the [facility] due to a family history of cancer and concerns regarding a hereditary predisposition to cancer. Please refer to our prior cancer genetics clinic note for more information regarding our discussion, assessment and recommendations, at the time. Ms. Wisler recent genetic test results were disclosed to her, as were recommendations warranted by these results. These results and recommendations are discussed in more detail below.  CANCER HISTORY:  Oncology History   No history exists.    FAMILY HISTORY:  We obtained a detailed, 4-generation family history.  Significant diagnoses are listed below:      Family History  Problem Relation Age of Onset   Breast cancer Mother 72   Heart disease Father     Breast cancer Sister 60   Cancer Sister 60        angiosarcoma   Uterine cancer Maternal Aunt 1 - 63       Ms. Carr had three sisters. One sister was diagnosed with breast cancer at age 75 and was diagnosed with recurrent breast cancer in her late 63s, the breast cancer metastasized to her lungs and she died at age 76, she reportedly had negative BRCA1/2 genetic testing. Her second sister was diagnosed with angiosarcoma of the breast at age 23, she died at age 52. Ms. Treu mother was diagnosed with breast cancer at age 72 and died at age 59 due to COPD. Her maternal aunt was diagnosed with uterine cancer in her 38s, she died due to metastatic cancer. There is no reported Ashkenazi Jewish ancestry.   GENETIC TEST RESULTS:  The [facility] CustomNext Panel found no pathogenic mutations.  The CustomNext-Cancer+RNAinsight panel offered by [facility] includes sequencing and rearrangement analysis for the following 48 genes:  APC, ATM, AXIN2, BARD1, BMPR1A, BRCA1, BRCA2, BRIP1, CDH1, CDK4, CDKN2A, CHEK2, CTNNA1, DICER1, EPCAM, GREM1, HOXB13, KIT, MEN1, MLH1, MSH2, MSH3, MSH6, MUTYH, NBN, NF1, NTHL1, PALB2, PDGFRA,  PMS2, POLD1, POLE, POT1, PTEN, RAD50, RAD51C, RAD51D, SDHA, SDHB, SDHC, SDHD, SMAD4, SMARCA4, STK11, TP53, TSC1, TSC2, and VHL.  RNA data is routinely analyzed for use in variant interpretation for all genes.  The test report has been scanned into EPIC and is located under the Molecular Pathology section of the Results Review tab.  A portion of the result report is included below for reference. Genetic testing reported out on 11/18/2021.       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 At this time, it is unknown if the variants are associated with an increased risk for cancer or if they are benign, but most uncertain variants are reclassified to benign. It should not be used to make medical management decisions. With time, we suspect the laboratory will determine the significance of the variants, if any. If the laboratory reclassifies the variants, we will attempt to contact Ms. Schooler to discuss it further.   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 Report date is 06/16/2023.  Even though a pathogenic variant was not identified, possible explanations for the cancer in the family may include: There may be no hereditary risk for cancer in the family. The cancers in her family may be due to other genetic or environmental factors. There may be a gene mutation in one of these genes that current testing methods cannot detect, but that chance is small. There could be another gene that has not yet been discovered, or that  we have not yet tested, that is responsible for the cancer diagnoses in the family.  It is also possible there is a hereditary cause for the cancer in the family that Ms. Maxfield did not inherit.  Therefore, it is important to remain in touch with cancer genetics in the future so that we can continue to offer Ms. Bendix the most up to date genetic testing.   ADDITIONAL GENETIC TESTING:  We discussed with  Ms. Rucci that her genetic testing was fairly extensive.  If there are genes identified to increase cancer risk that can be analyzed in the future, we would be happy to discuss and coordinate this testing at that time.    CANCER SCREENING RECOMMENDATIONS:  Ms. Armwood test result is considered negative (normal).  This means that we have not identified a hereditary cause for her family history of cancer at this time.   An individual's cancer risk and medical management are not determined by genetic test results alone. Overall cancer risk assessment incorporates additional factors, including personal medical history, family history, and any available genetic information that may result in a personalized plan for cancer prevention and surveillance. Therefore, it is recommended she continue to follow the cancer management and screening guidelines provided by her healthcare providers. Ms. Muecke Tyrer-Cuzick risk score is 8%.  RECOMMENDATIONS FOR FAMILY MEMBERS:   Since she did not inherit a mutation in a cancer predisposition gene included on this panel, her children could not have inherited a mutation from her in one of these genes. We do not recommend familial testing for the ATM and POT1 variants of uncertain significance (VUS).  FOLLOW-UP:  Cancer genetics is a rapidly advancing field and it is possible that new genetic tests will be appropriate for her and/or her family members in the future. We encouraged her to remain in contact with cancer genetics on an annual basis so we can update her personal and family histories and let her know of advances in cancer genetics that may benefit this family.   Our contact number was provided. Ms. Petion questions were answered to her satisfaction, and she knows she is welcome to call us  at anytime with additional questions or concerns.   Brittanyann Wittner, MS, [facility] Genetic Counselor [email or web address] (P) [phone]

## 2021-12-03 ENCOUNTER — Other Ambulatory Visit: Payer: Self-pay | Admitting: Internal Medicine

## 2021-12-14 ENCOUNTER — Ambulatory Visit: Payer: Medicare PPO | Admitting: Internal Medicine

## 2021-12-14 ENCOUNTER — Telehealth: Payer: Self-pay

## 2021-12-14 ENCOUNTER — Encounter: Payer: Self-pay | Admitting: Internal Medicine

## 2021-12-14 VITALS — BP 122/64 | HR 80 | Temp 98.4°F | Ht 64.0 in | Wt 142.6 lb

## 2021-12-14 DIAGNOSIS — I10 Essential (primary) hypertension: Secondary | ICD-10-CM

## 2021-12-14 DIAGNOSIS — K219 Gastro-esophageal reflux disease without esophagitis: Secondary | ICD-10-CM

## 2021-12-14 DIAGNOSIS — R110 Nausea: Secondary | ICD-10-CM | POA: Diagnosis not present

## 2021-12-14 DIAGNOSIS — Z86010 Personal history of colonic polyps: Secondary | ICD-10-CM | POA: Diagnosis not present

## 2021-12-14 DIAGNOSIS — K824 Cholesterolosis of gallbladder: Secondary | ICD-10-CM | POA: Diagnosis not present

## 2021-12-14 DIAGNOSIS — E7800 Pure hypercholesterolemia, unspecified: Secondary | ICD-10-CM

## 2021-12-14 DIAGNOSIS — F439 Reaction to severe stress, unspecified: Secondary | ICD-10-CM

## 2021-12-14 DIAGNOSIS — R739 Hyperglycemia, unspecified: Secondary | ICD-10-CM | POA: Diagnosis not present

## 2021-12-14 LAB — LIPID PANEL
Cholesterol: 164 mg/dL (ref 0–200)
HDL: 66.4 mg/dL (ref >39.00)
LDL Cholesterol: 87 mg/dL (ref 0–99)
NonHDL: 97.81
Total CHOL/HDL Ratio: 2
Triglycerides: 53 mg/dL (ref 0.0–149.0)
VLDL: 10.6 mg/dL (ref 0.0–40.0)

## 2021-12-14 LAB — BASIC METABOLIC PANEL
BUN: 17 mg/dL (ref 6–23)
CO2: 28 mEq/L (ref 19–32)
Calcium: 9 mg/dL (ref 8.4–10.5)
Chloride: 106 mEq/L (ref 96–112)
Creatinine, Ser: 0.71 mg/dL (ref 0.40–1.20)
GFR: 85.12 mL/min (ref >60.00)
Glucose, Bld: 112 mg/dL — ABNORMAL HIGH (ref 70–99)
Potassium: 4.4 mEq/L (ref 3.5–5.1)
Sodium: 140 mEq/L (ref 135–145)

## 2021-12-14 LAB — HEPATIC FUNCTION PANEL
ALT: 16 U/L (ref 0–35)
AST: 19 U/L (ref 0–37)
Albumin: 4.4 g/dL (ref 3.5–5.2)
Alkaline Phosphatase: 77 U/L (ref 39–117)
Bilirubin, Direct: 0.2 mg/dL (ref 0.0–0.3)
Total Bilirubin: 1.1 mg/dL (ref 0.2–1.2)
Total Protein: 6.7 g/dL (ref 6.0–8.3)

## 2021-12-14 LAB — CBC WITH DIFFERENTIAL/PLATELET
Basophils Absolute: 0.1 10*3/uL (ref 0.0–0.1)
Basophils Relative: 0.7 % (ref 0.0–3.0)
Eosinophils Absolute: 0.1 10*3/uL (ref 0.0–0.7)
Eosinophils Relative: 1.1 % (ref 0.0–5.0)
HCT: 38 % (ref 36.0–46.0)
Hemoglobin: 12.5 g/dL (ref 12.0–15.0)
Lymphocytes Relative: 26.7 % (ref 12.0–46.0)
Lymphs Abs: 2 10*3/uL (ref 0.7–4.0)
MCHC: 33 g/dL (ref 30.0–36.0)
MCV: 87.3 fl (ref 78.0–100.0)
Monocytes Absolute: 0.5 10*3/uL (ref 0.1–1.0)
Monocytes Relative: 7.2 % (ref 3.0–12.0)
Neutro Abs: 4.8 10*3/uL (ref 1.4–7.7)
Neutrophils Relative %: 64.3 % (ref 43.0–77.0)
Platelets: 300 10*3/uL (ref 150.0–400.0)
RBC: 4.35 Mil/uL (ref 3.87–5.11)
RDW: 13.7 % (ref 11.5–15.5)
WBC: 7.5 10*3/uL (ref 4.0–10.5)

## 2021-12-14 LAB — HEMOGLOBIN A1C: Hgb A1c MFr Bld: 5.9 % (ref 4.6–6.5)

## 2021-12-14 MED ORDER — SERTRALINE HCL 50 MG PO TABS: 50.0000 mg | ORAL_TABLET | Freq: Every day | ORAL | 1 refills | Status: DC

## 2021-12-14 MED ORDER — TELMISARTAN 80 MG PO TABS: 80.0000 mg | ORAL_TABLET | Freq: Every day | ORAL | 1 refills | Status: DC

## 2021-12-14 MED ORDER — ROSUVASTATIN CALCIUM 10 MG PO TABS: 10.0000 mg | ORAL_TABLET | Freq: Every day | ORAL | 1 refills | Status: DC

## 2021-12-14 NOTE — Telephone Encounter (Signed)
Was there anything that you would like for me to let the pt know.

## 2021-12-14 NOTE — Progress Notes (Signed)
Patient ID: TAREA SKILLMAN, female   DOB: 01-15-1951, 71 y.o.   MRN: [id number]   Subjective:    Patient ID: Theresa Francis, female    DOB: Jul 14, 1950, 71 y.o.   MRN: [id number]   Patient here for  Chief Complaint  Patient presents with   Follow-up    3 month f/u Pt would like to discuss her consistent nausea and possibly having her gallbladder checked    .   HPI Here to follow up regarding her blood pressure and increased stress.  On sertraline.  Appears to be doing on on this medication.  Recent genetic testing negative.  Does report persistent intermittent nausea.  Has noticed over the last month.  Eating.  Some decreased appetite.  Previous acid reflux.  Not a significant issue now.  Regular bowel movements.  Taking pepto bismol.  Discussed recent ultrasound and results including gallbladder polyps.    Past Medical History:  Diagnosis Date   Abnormal Pap smear    s/p conization.    Basal cell carcinoma    eye lid   History of chicken pox    History of shingles    Osteoarthritis    Urine incontinence    H/O occasional   Past Surgical History:  Procedure Laterality Date   BREAST BIOPSY Left 11/03/2014   neg./ done by Dr. 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   FINE NEEDLE ASPIRATION Left 2010   Dr Bary Castilla   TONSILLECTOMY  1967   Family History  Problem Relation Age of Onset   Breast cancer Mother 50   Heart disease Father    Breast cancer Sister 41   Cancer Sister 69       angiosarcoma   Uterine cancer Maternal Aunt 60 - 34   Social History   Socioeconomic History   Marital status: Married    Spouse name: Not on file   Number of  children: 3   Years of education: Not on file   Highest education level: Not on file  Occupational History   Not on file  Tobacco Use   Smoking status: Never   Smokeless tobacco: Never  Vaping Use   Vaping Use: Never used  Substance and Sexual Activity   Alcohol use: Yes    Alcohol/week: 0.0 standard drinks of alcohol    Comment: drinks wine occasionally   Drug use: No   Sexual activity: Not on file  Other Topics Concern   Not on file  Social History Narrative   Not on file   Social Determinants of Health   Financial Resource Strain: Low Risk  (11/04/2020)   Overall Financial Resource Strain (CARDIA)    Difficulty of Paying Living Expenses: Not hard at all  Food Insecurity: No Food Insecurity (11/04/2020)   Hunger Vital Sign    Worried About Running Out of Food in the Last Year: Never true    Ran Out of Food in the Last Year: Never true  Transportation Needs: No Transportation Needs (11/04/2020)   PRAPARE - Hydrologist (Medical): No    Lack of Transportation (Non-Medical): No  Physical Activity: Sufficiently Active (11/04/2020)   Exercise Vital Sign    Days of Exercise per Week: 3 days    Minutes of Exercise per Session: 60 min  Stress: No Stress Concern Present (11/04/2020)   [place]    Feeling of Stress : Not at all  Social Connections: Unknown (11/04/2020)   Social Connection and Isolation Panel [NHANES]    Frequency of Communication with Friends and Family: More than three times a week    Frequency of Social Gatherings with Friends and Family: More than three times a week    Attends Religious Services: Not on Advertising copywriter or Organizations: Not on file    Attends Archivist Meetings: Not on file    Marital Status: Not on file     Review of Systems  Constitutional:  Negative for unexpected weight change.       Some decreased appetite.   HENT:   Negative for congestion and sinus pressure.   Respiratory:  Negative for cough, chest tightness and shortness of breath.   Cardiovascular:  Negative for chest pain, palpitations and leg swelling.  Gastrointestinal:  Positive for nausea. Negative for abdominal pain, diarrhea and vomiting.  Genitourinary:  Negative for difficulty urinating and dysuria.  Musculoskeletal:  Negative for joint swelling and myalgias.  Skin:  Negative for color change and rash.  Neurological:  Negative for dizziness, light-headedness and headaches.  Psychiatric/Behavioral:  Negative for agitation and dysphoric mood.        Objective:     BP 122/64 (BP Location: Left Arm, Patient Position: Sitting, Cuff Size: Normal)   Pulse 80   Temp 98.4 F (36.9 C) (Oral)   Ht '5\' 4"'$  (1.626 m)   Wt 142 lb 9.6 oz (64.7 kg)   SpO2 98%   BMI 24.48 kg/m  Wt Readings from Last 3 Encounters:  12/14/21 142 lb 9.6 oz (64.7 kg)  09/13/21 143 lb 3.2 oz (65 kg)  06/04/21 144 lb 12.8 oz (65.7 kg)    Physical Exam Vitals reviewed.  Constitutional:      General: She is not in acute distress.    Appearance: Normal appearance.  HENT:     Head: Normocephalic and atraumatic.     Right Ear: External ear normal.     Left Ear: External ear normal.  Eyes:     General: No scleral icterus.       Right eye: No discharge.        Left eye: No discharge.     Conjunctiva/sclera: Conjunctivae normal.  Neck:     Thyroid: No thyromegaly.  Cardiovascular:     Rate and Rhythm: Normal rate and regular rhythm.  Pulmonary:     Effort: No respiratory distress.     Breath sounds: Normal breath sounds. No wheezing.  Abdominal:     General: Bowel sounds are normal.     Palpations: Abdomen is soft.     Tenderness: There is no abdominal tenderness.  Musculoskeletal:        General: No swelling or tenderness.     Cervical back: Neck supple. No tenderness.  Lymphadenopathy:     Cervical: No cervical adenopathy.  Skin:    Findings: No  erythema or rash.  Neurological:     Mental Status: She is alert.  Psychiatric:        Mood and Affect: Mood normal.        Behavior: Behavior normal.  Outpatient Encounter Medications as of 12/14/2021  Medication Sig   acetaminophen (TYLENOL) 650 MG CR tablet Take 1,300 mg by mouth every 6 (six) hours as needed for pain.   benzonatate (TESSALON) 100 MG capsule Take 1 capsule (100 mg total) by mouth 3 (three) times daily as needed.   Calcium Carbonate-Vitamin D (CALTRATE 600+D PO) Take by mouth daily.   ondansetron (ZOFRAN) 4 MG tablet TAKE ONE TABLET BY MOUTH TWICE DAILY AS NEEDED FOR NAUSEA OR VOMITING   vitamin B-12 (CYANOCOBALAMIN) 100 MCG tablet Take 100 mcg by mouth daily.   [DISCONTINUED] rosuvastatin (CRESTOR) 10 MG tablet TAKE ONE TABLET BY MOUTH EVERY DAY   [DISCONTINUED] sertraline (ZOLOFT) 50 MG tablet TAKE ONE TABLET BY MOUTH EVERY DAY   [DISCONTINUED] telmisartan (MICARDIS) 80 MG tablet TAKE ONE TABLET BY MOUTH EVERY DAY   rosuvastatin (CRESTOR) 10 MG tablet Take 1 tablet (10 mg total) by mouth daily.   sertraline (ZOLOFT) 50 MG tablet Take 1 tablet (50 mg total) by mouth daily.   telmisartan (MICARDIS) 80 MG tablet Take 1 tablet (80 mg total) by mouth daily.   [DISCONTINUED] Cholecalciferol 50 MCG (2000 UT) CAPS Take 1 capsule by mouth daily. (Patient not taking: Reported on 12/14/2021)   No facility-administered encounter medications on file as of 12/14/2021.     Lab Results  Component Value Date   WBC 7.5 12/14/2021   HGB 12.5 12/14/2021   HCT 38.0 12/14/2021   PLT 300.0 12/14/2021   GLUCOSE 112 (H) 12/14/2021   CHOL 164 12/14/2021   TRIG 53.0 12/14/2021   HDL 66.40 12/14/2021   LDLDIRECT 130.6 11/26/2012   LDLCALC 87 12/14/2021   ALT 16 12/14/2021   AST 19 12/14/2021   NA 140 12/14/2021   K 4.4 12/14/2021   CL 106 12/14/2021   CREATININE 0.71 12/14/2021   BUN 17 12/14/2021   CO2 28 12/14/2021   TSH 1.76 05/06/2021   HGBA1C 5.9 12/14/2021    US  Abdomen Complete  Result Date: 09/24/2021 CLINICAL DATA:  71 year old female for follow-up of gallbladder polyps. EXAM: ABDOMEN ULTRASOUND COMPLETE COMPARISON:  05/28/2020 and prior ultrasounds FINDINGS: Gallbladder: Multiple gallbladder polyps are again identified and not significantly changed, the largest measuring 0.4 cm. There is no evidence of cholelithiasis or acute cholecystitis. Common bile duct: Diameter: 4 mm. There is no evidence of intrahepatic or extrahepatic biliary dilatation. Liver: No focal lesion identified. Within normal limits in parenchymal echogenicity. Portal vein is patent on color Doppler imaging with normal direction of blood flow towards the liver. IVC: No abnormality visualized. Pancreas: Visualized portion unremarkable. Spleen: Size and appearance within normal limits. Right Kidney: Length: 8.8 cm. Echogenicity within normal limits. No mass or hydronephrosis visualized. Left Kidney: Length: 9.9 cm. Echogenicity within normal limits. No mass or hydronephrosis visualized. Abdominal aorta: No aneurysm visualized. Other findings: None. IMPRESSION: 1. Unchanged gallbladder polyps. No evidence of cholelithiasis, acute cholecystitis or biliary dilatation. 2. No other significant abnormalities. Electronically Signed   By: Margarette Canada M.D.   On: 09/24/2021 11:03       Assessment & Plan:   Problem List Items Addressed This Visit     Gallbladder polyp    Ultrasound 09/2021 - no acute abnormality.  Gallbladder polyps - unchanged.  Previously saw GI.       GERD (gastroesophageal reflux disease)    Start pepcid.  See if nausea improves.  Follow.       History of colon polyps    Colonoscopy 03/2021 - One 12 mm  polyp in the ascending colon, removed with mucosal resection. Resected and retrieved.  Post mucosectomy scar in the ascending colon.  One 10 mm polyp in the descending colon, removed with a cold snare. Resected and retrieved.  Diverticulosis in the sigmoid colon.  Internal  hemorrhoids. Mucosal resection was performed. Resection and retrieval were complete.  Recommended f/u colonoscopy in 3 years.        Hypercholesterolemia    Continue crestor.  Low cholesterol diet and exercise.  Follow lipid panel and liver function tests.        Relevant Medications   rosuvastatin (CRESTOR) 10 MG tablet   telmisartan (MICARDIS) 80 MG tablet   Other Relevant Orders   Hepatic function panel (Completed)   Lipid panel (Completed)   Hyperglycemia    Low-carb diet and exercise.  Follow [facility] and A1c.  Lab Results  Component Value Date   HGBA1C 5.9 12/14/2021       Relevant Orders   Hemoglobin A1c (Completed)   Hypertension - Primary    Blood pressure as outlined.  Recheck wnl.  On micardis '80mg'$  q day.  Follow pressures.  Check metabolic panel.       Relevant Medications   rosuvastatin (CRESTOR) 10 MG tablet   telmisartan (MICARDIS) 80 MG tablet   Other Relevant Orders   Basic metabolic panel (Completed)   Nausea    Nausea as outlined.  Recent abdominal ultrasound - no acute abnormality.  Gallbladder polyps unchanged.  Trial of pepcid.  Request referral back to GI [facility].  No abdominal pain. Bowels moving.      Relevant Orders   CBC with Differential/Platelet (Completed)   Ambulatory referral to Gastroenterology   Stress    Appears to be handling stress relatively well.  Continues on Zoloft.  No changes.  Follow.         Einar Pheasant, MD

## 2021-12-14 NOTE — Telephone Encounter (Signed)
Please tell her to take pepcid '20mg'$  - one tablet before breakfast.  I forgot to write down for her.  Thanks

## 2021-12-14 NOTE — Telephone Encounter (Signed)
Patient states at check-out that Dr. Einar Pheasant was going to give her some information about pepcid, but she does not see it on her After Visit Summary.  Please call.

## 2021-12-15 ENCOUNTER — Ambulatory Visit
Admission: RE | Admit: 2021-12-15 | Discharge: 2021-12-15 | Disposition: A | Discharge status: Home / Self Care | Payer: Medicare PPO | Source: Ambulatory Visit | Attending: Internal Medicine | Admitting: Internal Medicine

## 2021-12-15 DIAGNOSIS — Z1231 Encounter for screening mammogram for malignant neoplasm of breast: Secondary | ICD-10-CM | POA: Diagnosis not present

## 2021-12-15 NOTE — Telephone Encounter (Signed)
Pt is aware and gave a verbal understanding.  

## 2021-12-19 ENCOUNTER — Encounter: Payer: Self-pay | Admitting: Internal Medicine

## 2021-12-19 NOTE — Assessment & Plan Note (Signed)
Low-carb diet and exercise.  Follow [facility] and A1c.  Lab Results  Component Value Date   HGBA1C 5.9 12/14/2021

## 2021-12-19 NOTE — Assessment & Plan Note (Signed)
Appears to be handling stress relatively well.  Continues on Zoloft.  No changes.  Follow.

## 2021-12-19 NOTE — Assessment & Plan Note (Signed)
Ultrasound 09/2021 - no acute abnormality.  Gallbladder polyps - unchanged.  Previously saw GI.

## 2021-12-19 NOTE — Assessment & Plan Note (Signed)
Continue crestor.  Low cholesterol diet and exercise. Follow lipid panel and liver function tests.   

## 2021-12-19 NOTE — Assessment & Plan Note (Signed)
Colonoscopy 03/2021 - One 12 mm polyp in the ascending colon, removed with mucosal resection. Resected and retrieved.  Post mucosectomy scar in the ascending colon.  One 10 mm polyp in the descending colon, removed with a cold snare. Resected and retrieved.  Diverticulosis in the sigmoid colon.  Internal hemorrhoids. Mucosal resection was performed. Resection and retrieval were complete.  Recommended f/u colonoscopy in 3 years.

## 2021-12-19 NOTE — Assessment & Plan Note (Signed)
Start pepcid.  See if nausea improves.  Follow.

## 2021-12-19 NOTE — Assessment & Plan Note (Signed)
Nausea as outlined.  Recent abdominal ultrasound - no acute abnormality.  Gallbladder polyps unchanged.  Trial of pepcid.  Request referral back to GI [facility].  No abdominal pain. Bowels moving.

## 2021-12-19 NOTE — Assessment & Plan Note (Signed)
Blood pressure as outlined.  Recheck wnl.  On micardis '80mg'$  q day.  Follow pressures.  Check metabolic panel.

## 2021-12-20 ENCOUNTER — Encounter: Payer: Self-pay | Admitting: Genetic Counselor

## 2021-12-28 ENCOUNTER — Ambulatory Visit (INDEPENDENT_AMBULATORY_CARE_PROVIDER_SITE_OTHER): Payer: Medicare PPO

## 2021-12-28 VITALS — Ht 64.0 in | Wt 141.0 lb

## 2021-12-28 DIAGNOSIS — Z0000 Encounter for general adult medical examination without abnormal findings: Secondary | ICD-10-CM

## 2021-12-28 NOTE — Patient Instructions (Addendum)
Theresa Francis , Thank you for taking time to come for your Medicare Wellness Visit. I appreciate your ongoing commitment to your health goals. Please review the following plan we discussed and let me know if I can assist you in the future.   These are the goals we discussed:  Goals       Patient Stated     Keep all routine scheduled appointments (pt-stated)      Other     Avoid fried foods        This is a list of the screening recommended for you and due dates:  Health Maintenance  Topic Date Due   COVID-19 Vaccine (5 - Pfizer series) 04/01/2022   Medicare Annual Wellness Visit  12/29/2022   Mammogram  12/16/2023   Colon Cancer Screening  04/14/2024   Tetanus Vaccine  05/07/2031   Pneumonia Vaccine  Completed   Flu Shot  Completed   DEXA scan (bone density measurement)  Completed   Hepatitis C Screening: USPSTF Recommendation to screen - Ages 60-79 yo.  Completed   Zoster (Shingles) Vaccine  Completed   HPV Vaccine  Aged Out   Cologuard (Stool DNA test)  Discontinued    Advanced directives: End of life planning; Advance aging; Advanced directives discussed.  Copy of current HCPOA/Living Will requested.    Conditions/risks identified: none new  Next appointment: Follow up in one year for your annual wellness visit    Preventive Care 65 Years and Older, Female Preventive care refers to lifestyle choices and visits with your health care provider that can promote health and wellness. What does preventive care include? A yearly physical exam. This is also called an annual well check. Dental exams once or twice a year. Routine eye exams. Ask your health care provider how often you should have your eyes checked. Personal lifestyle choices, including: Daily care of your teeth and gums. Regular physical activity. Eating a healthy diet. Avoiding tobacco and drug use. Limiting alcohol use. Practicing safe sex. Taking low-dose aspirin every day. Taking vitamin and mineral  supplements as recommended by your health care provider. What happens during an annual well check? The services and screenings done by your health care provider during your annual well check will depend on your age, overall health, lifestyle risk factors, and family history of disease. Counseling  Your health care provider may ask you questions about your: Alcohol use. Tobacco use. Drug use. Emotional well-being. Home and relationship well-being. Sexual activity. Eating habits. History of falls. Memory and ability to understand (cognition). Work and work Statistician. Reproductive health. Screening  You may have the following tests or measurements: Height, weight, and BMI. Blood pressure. Lipid and cholesterol levels. These may be checked every 5 years, or more frequently if you are over 87 years old. Skin check. Lung cancer screening. You may have this screening every year starting at age 76 if you have a 30-pack-year history of smoking and currently smoke or have quit within the past 15 years. Fecal occult blood test (FOBT) of the stool. You may have this test every year starting at age 58. Flexible sigmoidoscopy or colonoscopy. You may have a sigmoidoscopy every 5 years or a colonoscopy every 10 years starting at age 70. Hepatitis C blood test. Hepatitis B blood test. Sexually transmitted disease (STD) testing. Diabetes screening. This is done by checking your blood sugar (glucose) after you have not eaten for a while (fasting). You may have this done every 1-3 years. Bone density scan. This is  done to screen for osteoporosis. You may have this done starting at age 42. Mammogram. This may be done every 1-2 years. Talk to your health care provider about how often you should have regular mammograms. Talk with your health care provider about your test results, treatment options, and if necessary, the need for more tests. Vaccines  Your health care provider may recommend certain  vaccines, such as: Influenza vaccine. This is recommended every year. Tetanus, diphtheria, and acellular pertussis (Tdap, Td) vaccine. You may need a Td booster every 10 years. Zoster vaccine. You may need this after age 18. Pneumococcal 13-valent conjugate (PCV13) vaccine. One dose is recommended after age 6. Pneumococcal polysaccharide (PPSV23) vaccine. One dose is recommended after age 34. Talk to your health care provider about which screenings and vaccines you need and how often you need them. This information is not intended to replace advice given to you by your health care provider. Make sure you discuss any questions you have with your health care provider. Document Released: 03/06/2015 Document Revised: 10/28/2015 Document Reviewed: 12/09/2014 Elsevier Interactive Patient Education  2017 [place] Prevention in the Home Falls can cause injuries. They can happen to people of all ages. There are many things you can do to make your home safe and to help prevent falls. What can I do on the outside of my home? Regularly fix the edges of walkways and driveways and fix any cracks. Remove anything that might make you trip as you walk through a door, such as a raised step or threshold. Trim any bushes or trees on the path to your home. Use bright outdoor lighting. Clear any walking paths of anything that might make someone trip, such as rocks or tools. Regularly check to see if handrails are loose or broken. Make sure that both sides of any steps have handrails. Any raised decks and porches should have guardrails on the edges. Have any leaves, snow, or ice cleared regularly. Use sand or salt on walking paths during winter. Clean up any spills in your garage right away. This includes oil or grease spills. What can I do in the bathroom? Use night lights. Install grab bars by the toilet and in the tub and shower. Do not use towel bars as grab bars. Use non-skid mats or decals in  the tub or shower. If you need to sit down in the shower, use a plastic, non-slip stool. Keep the floor dry. Clean up any water that spills on the floor as soon as it happens. Remove soap buildup in the tub or shower regularly. Attach bath mats securely with double-sided non-slip rug tape. Do not have throw rugs and other things on the floor that can make you trip. What can I do in the bedroom? Use night lights. Make sure that you have a light by your bed that is easy to reach. Do not use any sheets or blankets that are too big for your bed. They should not hang down onto the floor. Have a firm chair that has side arms. You can use this for support while you get dressed. Do not have throw rugs and other things on the floor that can make you trip. What can I do in the kitchen? Clean up any spills right away. Avoid walking on wet floors. Keep items that you use a lot in easy-to-reach places. If you need to reach something above you, use a strong step stool that has a grab bar. Keep electrical cords out of  the way. Do not use floor polish or wax that makes floors slippery. If you must use wax, use non-skid floor wax. Do not have throw rugs and other things on the floor that can make you trip. What can I do with my stairs? Do not leave any items on the stairs. Make sure that there are handrails on both sides of the stairs and use them. Fix handrails that are broken or loose. Make sure that handrails are as long as the stairways. Check any carpeting to make sure that it is firmly attached to the stairs. Fix any carpet that is loose or worn. Avoid having throw rugs at the top or bottom of the stairs. If you do have throw rugs, attach them to the floor with carpet tape. Make sure that you have a light switch at the top of the stairs and the bottom of the stairs. If you do not have them, ask someone to add them for you. What else can I do to help prevent falls? Wear shoes that: Do not have high  heels. Have rubber bottoms. Are comfortable and fit you well. Are closed at the toe. Do not wear sandals. If you use a stepladder: Make sure that it is fully opened. Do not climb a closed stepladder. Make sure that both sides of the stepladder are locked into place. Ask someone to hold it for you, if possible. Clearly mark and make sure that you can see: Any grab bars or handrails. First and last steps. Where the edge of each step is. Use tools that help you move around (mobility aids) if they are needed. These include: Canes. Walkers. Scooters. Crutches. Turn on the lights when you go into a dark area. Replace any light bulbs as soon as they burn out. Set up your furniture so you have a clear path. Avoid moving your furniture around. If any of your floors are uneven, fix them. If there are any pets around you, be aware of where they are. Review your medicines with your doctor. Some medicines can make you feel dizzy. This can increase your chance of falling. Ask your doctor what other things that you can do to help prevent falls. This information is not intended to replace advice given to you by your health care provider. Make sure you discuss any questions you have with your health care provider. Document Released: 12/04/2008 Document Revised: 07/16/2015 Document Reviewed: 03/14/2014 Elsevier Interactive Patient Education  2017 [employer].

## 2021-12-28 NOTE — Progress Notes (Signed)
Subjective:   Theresa Francis is a 71 y.o. female who presents for Medicare Annual (Subsequent) preventive examination.  Review of Systems    No ROS.  Medicare Wellness Virtual Visit.  Visual/audio telehealth visit, UTA vital signs.   See social history for additional risk factors.   Cardiac Risk Factors include: advanced age (>41mn, >>73women);hypertension     Objective:    Today's Vitals   12/28/21 0835  Weight: 141 lb (64 kg)  Height: '5\' 4"'$  (1.626 m)   Body mass index is 24.2 kg/m.     12/28/2021    8:34 AM 11/04/2020    9:32 AM 11/27/2019   10:54 AM 09/14/2018    8:29 AM 09/12/2017    9:00 AM  Advanced Directives  Does Patient Have a Medical Advance Directive? Yes Yes Yes Yes No  Type of AParamedicof AHaiglerLiving will HCollegeLiving will Living will H[place]Living will   Does patient want to make changes to medical advance directive? No - Patient declined No - Patient declined  No - Patient declined   Copy of H[place]in Chart? No - copy requested No - copy requested  No - copy requested   Would patient like information on creating a medical advance directive?     Yes (MAU/Ambulatory/Procedural Areas - Information given)    Current Medications (verified) Outpatient Encounter Medications as of 12/28/2021  Medication Sig   acetaminophen (TYLENOL) 650 MG CR tablet Take 1,300 mg by mouth every 6 (six) hours as needed for pain.   benzonatate (TESSALON) 100 MG capsule Take 1 capsule (100 mg total) by mouth 3 (three) times daily as needed.   Calcium Carbonate-Vitamin D (CALTRATE 600+D PO) Take by mouth daily.   ondansetron (ZOFRAN) 4 MG tablet TAKE ONE TABLET BY MOUTH TWICE DAILY AS NEEDED FOR NAUSEA OR VOMITING   rosuvastatin (CRESTOR) 10 MG tablet Take 1 tablet (10 mg total) by mouth daily.   sertraline (ZOLOFT) 50 MG tablet Take 1 tablet (50 mg total) by mouth daily.    telmisartan (MICARDIS) 80 MG tablet Take 1 tablet (80 mg total) by mouth daily.   vitamin B-12 (CYANOCOBALAMIN) 100 MCG tablet Take 100 mcg by mouth daily.   No facility-administered encounter medications on file as of 12/28/2021.    Allergies (verified) Patient has no known allergies.   History: Past Medical History:  Diagnosis Date   Abnormal Pap smear    s/p conization.    Basal cell carcinoma    eye lid   History of chicken pox    History of shingles    Osteoarthritis    Urine incontinence    H/O occasional   Past Surgical History:  Procedure Laterality Date   BREAST BIOPSY Left 11/03/2014   neg./ done by Dr. BBary Castillain office   BREAST CYST ASPIRATION Left    COLONOSCOPY  2008   Dr SSonny Masters  COLONOSCOPY WITH PROPOFOL N/A 11/27/2019   Procedure: COLONOSCOPY WITH PROPOFOL;  Surgeon: TVirgel Manifold MD;  Location: [facility] ENDOSCOPY;  Service: Endoscopy;  Laterality: N/A;  PATIENT COVID + on 9/03/202;   COPY ON CHART   EYE SURGERY Left 1957, 2002   x2 for Strbismis   FINE NEEDLE ASPIRATION Left 2010   Dr BBary Castilla  TONSILLECTOMY  1967   Family History  Problem Relation Age of Onset   Breast cancer Mother 778  Heart disease Father    Breast cancer Sister  51   Cancer Sister 38       angiosarcoma   Uterine cancer Maternal Aunt 60 - 49   Social History   Socioeconomic History   Marital status: Married    Spouse name: Not on file   Number of children: 3   Years of education: Not on file   Highest education level: Not on file  Occupational History   Not on file  Tobacco Use   Smoking status: Never   Smokeless tobacco: Never  Vaping Use   Vaping Use: Never used  Substance and Sexual Activity   Alcohol use: Yes    Alcohol/week: 0.0 standard drinks of alcohol    Comment: drinks wine occasionally   Drug use: No   Sexual activity: Not on file  Other Topics Concern   Not on file  Social History Narrative   Not on file   Social Determinants of Health    Financial Resource Strain: Low Risk  (12/28/2021)   Overall Financial Resource Strain (CARDIA)    Difficulty of Paying Living Expenses: Not hard at all  Food Insecurity: No Food Insecurity (12/28/2021)   Hunger Vital Sign    Worried About Running Out of Food in the Last Year: Never true    Ran Out of Food in the Last Year: Never true  Transportation Needs: No Transportation Needs (12/28/2021)   PRAPARE - Hydrologist (Medical): No    Lack of Transportation (Non-Medical): No  Physical Activity: Sufficiently Active (12/28/2021)   Exercise Vital Sign    Days of Exercise per Week: 3 days    Minutes of Exercise per Session: 60 min  Stress: No Stress Concern Present (12/28/2021)   [place]    Feeling of Stress : Not at all  Social Connections: Unknown (12/28/2021)   Social Connection and Isolation Panel [NHANES]    Frequency of Communication with Friends and Family: More than three times a week    Frequency of Social Gatherings with Friends and Family: More than three times a week    Attends Religious Services: Not on Advertising copywriter or Organizations: Not on file    Attends Archivist Meetings: Not on file    Marital Status: Not on file    Tobacco Counseling Counseling given: Not Answered   Clinical Intake:  Pre-visit preparation completed: Yes        Diabetes: No  How often do you need to have someone help you when you read instructions, pamphlets, or other written materials from your doctor or pharmacy?: 1 - Never    Interpreter Needed?: No      Activities of Daily Living    12/28/2021    8:39 AM  In your present state of health, do you have any difficulty performing the following activities:  Hearing? 0  Vision? 0  Difficulty concentrating or making decisions? 0  Walking or climbing stairs? 0  Dressing or bathing? 0  Doing errands, shopping? 0   Preparing Food and eating ? N  Using the Toilet? N  In the past six months, have you accidently leaked urine? N  Do you have problems with loss of bowel control? N  Managing your Medications? N  Managing your Finances? N  Housekeeping or managing your Housekeeping? N    Patient Care Team: Einar Pheasant, MD as PCP - General (Internal Medicine) Einar Pheasant, MD (Internal Medicine) Bary Castilla Forest Gleason, MD (  General Surgery)  Indicate any recent Medical Services you may have received from other than [facility] providers in the past year (date may be approximate).     Assessment:   This is a routine wellness examination for [employer].  I connected with  [name] on 12/28/21 by a audio enabled telemedicine application and verified that I am speaking with the correct person using two identifiers.  Patient Location: Home  Provider Location: Office/Clinic  I discussed the limitations of evaluation and management by telemedicine. The patient expressed understanding and agreed to proceed.   Hearing/Vision screen Hearing Screening - Comments:: Patient is able to hear conversational tones without difficulty.  No issues reported.   Vision Screening - Comments:: Followed by [facility] Wears corrective lenses They have seen their ophthalmologist in the last 12 months.    Dietary issues and exercise activities discussed: Current Exercise Habits: Home exercise routine, Type of exercise: walking, Time (Minutes): 60, Frequency (Times/Week): 3, Weekly Exercise (Minutes/Week): 180, Intensity: Moderate Avoids fried foods She tries to have a healthy diet Good water intake   Goals Addressed               This Visit's Progress     Patient Stated     Keep all routine scheduled appointments (pt-stated)        Other     Avoid fried foods   On track      Depression Screen    12/28/2021    8:39 AM 12/14/2021    9:12 AM 09/13/2021    8:09 AM 06/04/2021    1:39 PM  01/04/2021    8:40 AM 11/04/2020    9:10 AM 09/02/2020    9:18 AM  PHQ 2/9 Scores  PHQ - 2 Score 1 1 0 0 0 0 1    Fall Risk    12/28/2021    8:39 AM 12/14/2021    9:11 AM 09/13/2021    8:09 AM 06/04/2021    1:38 PM 01/04/2021    8:40 AM  Fall Risk   Falls in the past year? 0 0 0 0 0  Number falls in past yr: 0 0 0 0 0  Injury with Fall? 0 0 0 0 0  Risk for fall due to : No Fall Risks No Fall Risks No Fall Risks No Fall Risks No Fall Risks  Follow up Falls evaluation completed;Falls prevention discussed Falls evaluation completed Falls evaluation completed Falls evaluation completed Falls evaluation completed    FALL RISK PREVENTION PERTAINING TO THE HOME: Home free of loose throw rugs in walkways, pet beds, electrical cords, etc? Yes  Adequate lighting in your home to reduce risk of falls? Yes   ASSISTIVE DEVICES UTILIZED TO PREVENT FALLS: Life alert? No  Use of a cane, walker or w/c? No  Grab bars in the bathroom? No  Shower chair or bench in shower? No  Elevated toilet seat or a handicapped toilet? No   TIMED UP AND GO: Was the test performed? No .   Cognitive Function:    09/12/2017    9:08 AM  MMSE - Mini Mental State Exam  Orientation to time 5  Orientation to Place 5  Registration 3  Attention/ Calculation 5  Recall 3  Language- name 2 objects 2  Language- repeat 1  Language- follow 3 step command 3  Language- read & follow direction 1  Write a sentence 1  Copy design 1  Total score 30  12/28/2021    8:42 AM 09/14/2018    8:32 AM  6CIT Screen  What Year? 0 points 0 points  What month? 0 points 0 points  What time? 0 points 0 points  Count back from 20 0 points 0 points  Months in reverse 0 points 0 points  Repeat phrase 0 points 0 points  Total Score 0 points 0 points    Immunizations Immunization History  Administered Date(s) Administered   Fluad Quad(high Dose 65+) 12/10/2021   Influenza Split 11/22/2012, 11/19/2013   Influenza Whole  11/21/2016   Influenza, High Dose Seasonal PF 10/28/2015, 11/19/2016, 11/15/2018, 12/22/2020, 12/10/2021   Influenza,inj,Quad PF,6+ Mos 12/03/2014, 12/06/2017   Influenza-Unspecified 12/10/2014, 12/06/2017, 11/18/2019   PFIZER Comirnaty(Gray Top)Covid-19 Tri-Sucrose Vaccine 11/29/2021   PFIZER(Purple Top)SARS-COV-2 Vaccination 02/25/2019, 03/18/2019, 01/24/2020   Pfizer Covid-19 Vaccine Bivalent Booster 80yr & up 12/22/2020   Pneumococcal Conjugate-13 12/19/2016   Pneumococcal Polysaccharide-23 11/23/2017   Tdap 05/06/2021   Zoster Recombinat (Shingrix) 10/18/2017, 01/05/2018   Screening Tests Health Maintenance  Topic Date Due   COVID-19 Vaccine (5 - Pfizer series) 04/01/2022   Medicare Annual Wellness (AWV)  12/29/2022   MAMMOGRAM  12/16/2023   COLONOSCOPY (Pts 45-444yrInsurance coverage will need to be confirmed)  04/14/2024   TETANUS/TDAP  05/07/2031   Pneumonia Vaccine 6584Years old  Completed   INFLUENZA VACCINE  Completed   DEXA SCAN  Completed   Hepatitis C Screening  Completed   Zoster Vaccines- Shingrix  Completed   HPV VACCINES  Aged Out   Fecal DNA (Cologuard)  Discontinued   Health Maintenance There are no preventive care reminders to display for this patient.  Lung Cancer Screening: (Low Dose CT Chest recommended if Age 72-80ears, 30 pack-year currently smoking OR have quit w/in 15years.) does not qualify.   Vision Screening: Recommended annual ophthalmology exams for early detection of glaucoma and other disorders of the eye.  Dental Screening: Recommended annual dental exams for proper oral hygiene  Community Resource Referral / Chronic Care Management: CRR required this visit?  No   CCM required this visit?  No      Plan:     I have personally reviewed and noted the following in the patient's chart:   Medical and social history Use of alcohol, tobacco or illicit drugs  Current medications and supplements including opioid prescriptions. Patient  is not currently taking opioid prescriptions. Functional ability and status Nutritional status Physical activity Advanced directives List of other physicians Hospitalizations, surgeries, and ER visits in previous 12 months Vitals Screenings to include cognitive, depression, and falls Referrals and appointments  In addition, I have reviewed and discussed with patient certain preventive protocols, quality metrics, and best practice recommendations. A written personalized care plan for preventive services as well as general preventive health recommendations were provided to patient.     DeLeta JunglingLPN   [id number]

## 2022-02-23 DIAGNOSIS — Z0100 Encounter for examination of eyes and vision without abnormal findings: Secondary | ICD-10-CM | POA: Diagnosis not present

## 2022-02-23 DIAGNOSIS — H2513 Age-related nuclear cataract, bilateral: Secondary | ICD-10-CM | POA: Diagnosis not present

## 2022-02-24 ENCOUNTER — Encounter: Payer: Self-pay | Admitting: Internal Medicine

## 2022-02-24 MED ORDER — SERTRALINE HCL 50 MG PO TABS: 50.0000 mg | ORAL_TABLET | Freq: Every day | ORAL | 1 refills | Status: DC

## 2022-02-24 NOTE — Telephone Encounter (Signed)
Rx sent in for zoloft #90 with one refill.

## 2022-03-17 ENCOUNTER — Encounter: Payer: Self-pay | Admitting: Internal Medicine

## 2022-03-17 ENCOUNTER — Ambulatory Visit: Payer: Medicare PPO | Admitting: Internal Medicine

## 2022-03-17 VITALS — BP 122/70 | HR 89 | Temp 97.9°F | Resp 16 | Ht 64.0 in | Wt 142.4 lb

## 2022-03-17 DIAGNOSIS — I10 Essential (primary) hypertension: Secondary | ICD-10-CM | POA: Diagnosis not present

## 2022-03-17 DIAGNOSIS — E2839 Other primary ovarian failure: Secondary | ICD-10-CM

## 2022-03-17 DIAGNOSIS — K824 Cholesterolosis of gallbladder: Secondary | ICD-10-CM

## 2022-03-17 DIAGNOSIS — M8580 Other specified disorders of bone density and structure, unspecified site: Secondary | ICD-10-CM

## 2022-03-17 DIAGNOSIS — R739 Hyperglycemia, unspecified: Secondary | ICD-10-CM | POA: Diagnosis not present

## 2022-03-17 DIAGNOSIS — E7800 Pure hypercholesterolemia, unspecified: Secondary | ICD-10-CM

## 2022-03-17 DIAGNOSIS — R5383 Other fatigue: Secondary | ICD-10-CM

## 2022-03-17 DIAGNOSIS — Z86010 Personal history of colonic polyps: Secondary | ICD-10-CM | POA: Diagnosis not present

## 2022-03-17 DIAGNOSIS — K219 Gastro-esophageal reflux disease without esophagitis: Secondary | ICD-10-CM | POA: Diagnosis not present

## 2022-03-17 DIAGNOSIS — R7989 Other specified abnormal findings of blood chemistry: Secondary | ICD-10-CM

## 2022-03-17 DIAGNOSIS — F439 Reaction to severe stress, unspecified: Secondary | ICD-10-CM

## 2022-03-17 DIAGNOSIS — R490 Dysphonia: Secondary | ICD-10-CM

## 2022-03-17 LAB — BASIC METABOLIC PANEL
BUN: 13 mg/dL (ref 6–23)
CO2: 27 mEq/L (ref 19–32)
Calcium: 9.4 mg/dL (ref 8.4–10.5)
Chloride: 104 mEq/L (ref 96–112)
Creatinine, Ser: 0.72 mg/dL (ref 0.40–1.20)
GFR: 83.56 mL/min (ref >60.00)
Glucose, Bld: 105 mg/dL — ABNORMAL HIGH (ref 70–99)
Potassium: 4.3 mEq/L (ref 3.5–5.1)
Sodium: 140 mEq/L (ref 135–145)

## 2022-03-17 LAB — CBC WITH DIFFERENTIAL/PLATELET
Basophils Absolute: 0 10*3/uL (ref 0.0–0.1)
Basophils Relative: 0.4 % (ref 0.0–3.0)
Eosinophils Absolute: 0.1 10*3/uL (ref 0.0–0.7)
Eosinophils Relative: 1 % (ref 0.0–5.0)
HCT: 39.9 % (ref 36.0–46.0)
Hemoglobin: 13.3 g/dL (ref 12.0–15.0)
Lymphocytes Relative: 35 % (ref 12.0–46.0)
Lymphs Abs: 2.3 10*3/uL (ref 0.7–4.0)
MCHC: 33.2 g/dL (ref 30.0–36.0)
MCV: 86.5 fl (ref 78.0–100.0)
Monocytes Absolute: 0.5 10*3/uL (ref 0.1–1.0)
Monocytes Relative: 7.4 % (ref 3.0–12.0)
Neutro Abs: 3.7 10*3/uL (ref 1.4–7.7)
Neutrophils Relative %: 56.2 % (ref 43.0–77.0)
Platelets: 301 10*3/uL (ref 150.0–400.0)
RBC: 4.62 Mil/uL (ref 3.87–5.11)
RDW: 13.7 % (ref 11.5–15.5)
WBC: 6.6 10*3/uL (ref 4.0–10.5)

## 2022-03-17 LAB — LIPID PANEL
Cholesterol: 167 mg/dL (ref 0–200)
HDL: 70.4 mg/dL (ref >39.00)
LDL Cholesterol: 81 mg/dL (ref 0–99)
NonHDL: 96.2
Total CHOL/HDL Ratio: 2
Triglycerides: 74 mg/dL (ref 0.0–149.0)
VLDL: 14.8 mg/dL (ref 0.0–40.0)

## 2022-03-17 LAB — HEPATIC FUNCTION PANEL
ALT: 18 U/L (ref 0–35)
AST: 20 U/L (ref 0–37)
Albumin: 4.8 g/dL (ref 3.5–5.2)
Alkaline Phosphatase: 79 U/L (ref 39–117)
Bilirubin, Direct: 0.2 mg/dL (ref 0.0–0.3)
Total Bilirubin: 1.2 mg/dL (ref 0.2–1.2)
Total Protein: 7.2 g/dL (ref 6.0–8.3)

## 2022-03-17 LAB — TSH: TSH: 1.58 u[IU]/mL (ref 0.35–5.50)

## 2022-03-17 LAB — HEMOGLOBIN A1C: Hgb A1c MFr Bld: 6 % (ref 4.6–6.5)

## 2022-03-17 NOTE — Progress Notes (Signed)
Subjective:    Patient ID: Theresa Francis, female    DOB: 04-25-1950, 72 y.o.   MRN: [id number]  Patient here for  Chief Complaint  Patient presents with   Medical Management of Chronic Issues    HPI Here to follow up regarding her blood pressure and increased stress.  Previous nausea.  Started pepcid.  Was referred to [facility]).  Reports today that the nausea has resolved.  Eating.  No significant acid reflux.  Rarely takes pepcid.  Stays active.  No chest pain.  No sob.  No cough or congestion.  Does report persistent hoarseness.  No swallowing issues.  No abdominal pain or bowel change.  Increased stress and some increased anxiety.  Overall feels she is handling things relatively well.  Does report some fatigue.    Past Medical History:  Diagnosis Date   Abnormal Pap smear    s/p conization.    Basal cell carcinoma    eye lid   History of chicken pox    History of shingles    Osteoarthritis    Urine incontinence    H/O occasional   Past Surgical History:  Procedure Laterality Date   BREAST BIOPSY Left 11/03/2014   neg./ done by Dr. 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   FINE NEEDLE ASPIRATION Left 2010   Dr Bary Castilla   TONSILLECTOMY  1967   Family History  Problem Relation Age of Onset   Breast cancer Mother 71   Heart disease Father    Breast cancer Sister 21   Cancer Sister 59       angiosarcoma   Uterine cancer Maternal Aunt 60 - 40   Social History   Socioeconomic History   Marital status: Married    Spouse name: Not on file   Number of children: 3   Years of education: Not on file   Highest education level: Not on file   Occupational History   Not on file  Tobacco Use   Smoking status: Never   Smokeless tobacco: Never  Vaping Use   Vaping Use: Never used  Substance and Sexual Activity   Alcohol use: Yes    Alcohol/week: 0.0 standard drinks of alcohol    Comment: drinks wine occasionally   Drug use: No   Sexual activity: Not on file  Other Topics Concern   Not on file  Social History Narrative   Not on file   Social Determinants of Health   Financial Resource Strain: Low Risk  (12/28/2021)   Overall Financial Resource Strain (CARDIA)    Difficulty of Paying Living Expenses: Not hard at all  Food Insecurity: No Food Insecurity (12/28/2021)   Hunger Vital Sign    Worried About Running Out of Food in the Last Year: Never true    Ran Out of Food in the Last Year: Never true  Transportation Needs: No Transportation Needs (12/28/2021)   PRAPARE - Hydrologist (Medical): No    Lack of Transportation (Non-Medical): No  Physical Activity: Sufficiently Active (12/28/2021)   Exercise Vital Sign    Days of Exercise  per Week: 3 days    Minutes of Exercise per Session: 60 min  Stress: No Stress Concern Present (12/28/2021)   [place]    Feeling of Stress : Not at all  Social Connections: Unknown (12/28/2021)   Social Connection and Isolation Panel [NHANES]    Frequency of Communication with Friends and Family: More than three times a week    Frequency of Social Gatherings with Friends and Family: More than three times a week    Attends Religious Services: Not on Advertising copywriter or Organizations: Not on file    Attends Archivist Meetings: Not on file    Marital Status: Not on file     Review of Systems  Constitutional:  Positive for fatigue. Negative for appetite change and unexpected weight change.  HENT:  Negative for congestion and sinus pressure.        Persistent hoarseness.    Respiratory:  Negative for cough, chest tightness and shortness of breath.   Cardiovascular:  Negative for chest pain, palpitations and leg swelling.  Gastrointestinal:  Negative for abdominal pain, diarrhea, nausea and vomiting.  Genitourinary:  Negative for difficulty urinating and dysuria.  Musculoskeletal:  Negative for joint swelling and myalgias.  Skin:  Negative for color change and rash.  Neurological:  Negative for dizziness and headaches.  Psychiatric/Behavioral:  Negative for agitation and dysphoric mood.        Objective:     BP 122/70   Pulse 89   Temp 97.9 F (36.6 C)   Resp 16   Ht '5\' 4"'$  (1.626 m)   Wt 142 lb 6.4 oz (64.6 kg)   SpO2 98%   BMI 24.44 kg/m  Wt Readings from Last 3 Encounters:  03/17/22 142 lb 6.4 oz (64.6 kg)  12/28/21 141 lb (64 kg)  12/14/21 142 lb 9.6 oz (64.7 kg)    Physical Exam Vitals reviewed.  Constitutional:      General: She is not in acute distress.    Appearance: Normal appearance.  HENT:     Head: Normocephalic and atraumatic.     Right Ear: External ear normal.     Left Ear: External ear normal.  Eyes:     General: No scleral icterus.       Right eye: No discharge.        Left eye: No discharge.     Conjunctiva/sclera: Conjunctivae normal.  Neck:     Thyroid: No thyromegaly.  Cardiovascular:     Rate and Rhythm: Normal rate and regular rhythm.  Pulmonary:     Effort: No respiratory distress.     Breath sounds: Normal breath sounds. No wheezing.  Abdominal:     General: Bowel sounds are normal.     Palpations: Abdomen is soft.     Tenderness: There is no abdominal tenderness.  Musculoskeletal:        General: No swelling or tenderness.     Cervical back: Neck supple. No tenderness.  Lymphadenopathy:     Cervical: No cervical adenopathy.  Skin:    Findings: No erythema or rash.  Neurological:     Mental Status: She is alert.  Psychiatric:        Mood and Affect: Mood normal.        Behavior: Behavior normal.       Outpatient Encounter Medications as of 03/17/2022  Medication Sig   acetaminophen (TYLENOL) 650 MG CR tablet Take  1,300 mg by mouth every 6 (six) hours as needed for pain.   Calcium Carbonate-Vitamin D (CALTRATE 600+D PO) Take by mouth daily.   ondansetron (ZOFRAN) 4 MG tablet TAKE ONE TABLET BY MOUTH TWICE DAILY AS NEEDED FOR NAUSEA OR VOMITING   rosuvastatin (CRESTOR) 10 MG tablet Take 1 tablet (10 mg total) by mouth daily.   sertraline (ZOLOFT) 50 MG tablet Take 1 tablet (50 mg total) by mouth daily.   telmisartan (MICARDIS) 80 MG tablet Take 1 tablet (80 mg total) by mouth daily.   vitamin B-12 (CYANOCOBALAMIN) 100 MCG tablet Take 100 mcg by mouth daily.   [DISCONTINUED] benzonatate (TESSALON) 100 MG capsule Take 1 capsule (100 mg total) by mouth 3 (three) times daily as needed.   No facility-administered encounter medications on file as of 03/17/2022.     Lab Results  Component Value Date   WBC 6.6 03/17/2022   HGB 13.3 03/17/2022   HCT 39.9 03/17/2022   PLT 301.0 03/17/2022   GLUCOSE 105 (H) 03/17/2022   CHOL 167 03/17/2022   TRIG 74.0 03/17/2022   HDL 70.40 03/17/2022   LDLDIRECT 130.6 11/26/2012   LDLCALC 81 03/17/2022   ALT 18 03/17/2022   AST 20 03/17/2022   NA 140 03/17/2022   K 4.3 03/17/2022   CL 104 03/17/2022   CREATININE 0.72 03/17/2022   BUN 13 03/17/2022   CO2 27 03/17/2022   TSH 1.58 03/17/2022   HGBA1C 6.0 03/17/2022    MM 3D SCREEN BREAST BILATERAL  Result Date: 12/16/2021 CLINICAL DATA:  Screening. EXAM: DIGITAL SCREENING BILATERAL MAMMOGRAM WITH TOMOSYNTHESIS AND CAD TECHNIQUE: Bilateral screening digital craniocaudal and mediolateral oblique mammograms were obtained. Bilateral screening digital breast tomosynthesis was performed. The images were evaluated with computer-aided detection. COMPARISON:  Previous exam(s). ACR Breast Density Category b: There are scattered areas of fibroglandular density. FINDINGS: There are no findings suspicious for  malignancy. IMPRESSION: No mammographic evidence of malignancy. A result letter of this screening mammogram will be mailed directly to the patient. RECOMMENDATION: Screening mammogram in one year. (Code:SM-B-01Y) BI-RADS CATEGORY  1: Negative. Electronically Signed   By: Margarette Canada M.D.   On: 12/16/2021 16:50       Assessment & Plan:  Hypertension, unspecified type Assessment & Plan: Blood pressure as outlined. On micardis '80mg'$  q day.  Follow pressures.  Follow metabolic panel.   Orders: -     Basic metabolic panel  Hypercholesterolemia Assessment & Plan: Continue crestor.  Low cholesterol diet and exercise.  Follow lipid panel and liver function tests.    Orders: -     Hepatic function panel -     TSH -     Lipid panel  Hyperglycemia Assessment & Plan: Low-carb diet and exercise.  Follow Met b and A1c.  Lab Results  Component Value Date   HGBA1C 6.0 03/17/2022    Orders: -     Hemoglobin A1c  Estrogen deficiency -     DG Bone Density; Future  Other fatigue Assessment & Plan: Probably multifactorial. Increased stress. Discussed.  Trying to stay active.  Check cbc and tsh.   Orders: -     CBC with Differential/Platelet  Abnormal liver function tests Assessment & Plan: Has had slightly elevated bilirubin previously.  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  Abdominal ultrasound (09/2021)  - unchanged gallbladder polyps. No evidence of cholelithiasis, acute cholecystitis or biliary dilatation. No other significant abnormalities. Follow liver panel.    Gallbladder polyp Assessment & Plan: Ultrasound 09/2021 - no acute  abnormality.  Gallbladder polyps - unchanged.  Previously saw GI.    Gastroesophageal reflux disease, unspecified whether esophagitis present Assessment & Plan: No symptoms now.  Has pepcid.     History of colon polyps Assessment & Plan: Colonoscopy 03/2021 - One 12 mm polyp in the ascending colon, removed with mucosal resection. Resected and retrieved.  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  One 10 mm polyp in the descending colon, removed with a cold snare. Resected and retrieved.  Diverticulosis in the sigmoid colon.  Internal hemorrhoids. Mucosal resection was performed. Resection and retrieval were complete.  Recommended f/u colonoscopy in 3 years.     Osteopenia, unspecified location Assessment & Plan: Schedule f/u bone bone density. Received reclast previously.    Stress Assessment & Plan: Appears to be handling stress relatively well.  Continues on Zoloft.  No changes.  Follow.    Hoarseness Assessment & Plan: Persistent.  No significant acid reflux.  No increased drainage.  Given persistent hoarseness, refer to ENT for further evaluation.   Orders: -     Ambulatory referral to ENT     Einar Pheasant, MD

## 2022-03-18 ENCOUNTER — Encounter: Payer: Self-pay | Admitting: Internal Medicine

## 2022-03-18 DIAGNOSIS — R490 Dysphonia: Secondary | ICD-10-CM | POA: Insufficient documentation

## 2022-03-18 NOTE — Assessment & Plan Note (Signed)
Appears to be handling stress relatively well.  Continues on Zoloft.  No changes.  Follow.

## 2022-03-18 NOTE — Assessment & Plan Note (Signed)
No symptoms now.  Has pepcid.

## 2022-03-18 NOTE — Assessment & Plan Note (Signed)
Continue crestor.  Low cholesterol diet and exercise. Follow lipid panel and liver function tests.   

## 2022-03-18 NOTE — Assessment & Plan Note (Signed)
Probably multifactorial. Increased stress. Discussed.  Trying to stay active.  Check cbc and tsh.

## 2022-03-18 NOTE — Assessment & Plan Note (Signed)
Low-carb diet and exercise.  Follow Met b and A1c.  Lab Results  Component Value Date   HGBA1C 6.0 03/17/2022

## 2022-03-18 NOTE — Assessment & Plan Note (Signed)
Ultrasound 09/2021 - no acute abnormality.  Gallbladder polyps - unchanged.  Previously saw GI.

## 2022-03-18 NOTE — Assessment & Plan Note (Signed)
Schedule f/u bone bone density. Received reclast previously.

## 2022-03-18 NOTE — Assessment & Plan Note (Signed)
Colonoscopy 03/2021 - One 12 mm polyp in the ascending colon, removed with mucosal resection. Resected and retrieved.  Post mucosectomy scar in the ascending colon.  One 10 mm polyp in the descending colon, removed with a cold snare. Resected and retrieved.  Diverticulosis in the sigmoid colon.  Internal hemorrhoids. Mucosal resection was performed. Resection and retrieval were complete.  Recommended f/u colonoscopy in 3 years.

## 2022-03-18 NOTE — Assessment & Plan Note (Signed)
Persistent.  No significant acid reflux.  No increased drainage.  Given persistent hoarseness, refer to ENT for further evaluation.

## 2022-03-18 NOTE — Assessment & Plan Note (Signed)
Has had slightly elevated bilirubin previously.  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  Abdominal ultrasound (09/2021)  - unchanged gallbladder polyps. No evidence of cholelithiasis, acute cholecystitis or biliary dilatation. No other significant abnormalities. Follow liver panel.

## 2022-03-18 NOTE — Assessment & Plan Note (Addendum)
Blood pressure as outlined. On micardis '80mg'$  q day.  Follow pressures.  Follow metabolic panel.

## 2022-04-14 DIAGNOSIS — R490 Dysphonia: Secondary | ICD-10-CM | POA: Diagnosis not present

## 2022-04-14 DIAGNOSIS — K219 Gastro-esophageal reflux disease without esophagitis: Secondary | ICD-10-CM | POA: Diagnosis not present

## 2022-04-21 DIAGNOSIS — D2271 Melanocytic nevi of right lower limb, including hip: Secondary | ICD-10-CM | POA: Diagnosis not present

## 2022-04-21 DIAGNOSIS — Z85828 Personal history of other malignant neoplasm of skin: Secondary | ICD-10-CM | POA: Diagnosis not present

## 2022-04-21 DIAGNOSIS — D2261 Melanocytic nevi of right upper limb, including shoulder: Secondary | ICD-10-CM | POA: Diagnosis not present

## 2022-04-21 DIAGNOSIS — D225 Melanocytic nevi of trunk: Secondary | ICD-10-CM | POA: Diagnosis not present

## 2022-04-21 DIAGNOSIS — Z08 Encounter for follow-up examination after completed treatment for malignant neoplasm: Secondary | ICD-10-CM | POA: Diagnosis not present

## 2022-04-21 DIAGNOSIS — L718 Other rosacea: Secondary | ICD-10-CM | POA: Diagnosis not present

## 2022-05-04 ENCOUNTER — Ambulatory Visit
Admission: RE | Admit: 2022-05-04 | Discharge: 2022-05-04 | Disposition: A | Discharge status: Home / Self Care | Payer: Medicare PPO | Source: Ambulatory Visit | Attending: Internal Medicine | Admitting: Internal Medicine

## 2022-05-04 DIAGNOSIS — E2839 Other primary ovarian failure: Secondary | ICD-10-CM | POA: Diagnosis not present

## 2022-05-04 DIAGNOSIS — M85832 Other specified disorders of bone density and structure, left forearm: Secondary | ICD-10-CM | POA: Diagnosis not present

## 2022-05-04 DIAGNOSIS — Z780 Asymptomatic menopausal state: Secondary | ICD-10-CM | POA: Diagnosis not present

## 2022-05-05 ENCOUNTER — Telehealth: Payer: Self-pay

## 2022-05-05 NOTE — Telephone Encounter (Signed)
LMTCB regarding bone density results.

## 2022-05-05 NOTE — Telephone Encounter (Signed)
-----   Message from Einar Pheasant, MD sent at 05/05/2022  5:44 AM EDT ----- Please call and notify pt that her bone density is relatively stable from last check and reveals osteopenia. (Some bone loss, but does not meet criteria for osteoporosis).  Continue calcium, vitamin D and weight bearing exercise.  We will follow.

## 2022-06-16 ENCOUNTER — Telehealth: Payer: Self-pay | Admitting: Internal Medicine

## 2022-06-16 DIAGNOSIS — E7800 Pure hypercholesterolemia, unspecified: Secondary | ICD-10-CM

## 2022-06-16 DIAGNOSIS — R739 Hyperglycemia, unspecified: Secondary | ICD-10-CM

## 2022-06-16 NOTE — Telephone Encounter (Signed)
Labs ordered.

## 2022-06-16 NOTE — Telephone Encounter (Signed)
Patient has a lab appointment 06/20/2022, there are no orders in.

## 2022-06-16 NOTE — Addendum Note (Signed)
Addended by: Rita Ohara D on: 06/16/2022 02:43 PM   Modules accepted: Orders

## 2022-06-20 ENCOUNTER — Other Ambulatory Visit (INDEPENDENT_AMBULATORY_CARE_PROVIDER_SITE_OTHER): Payer: Medicare PPO

## 2022-06-20 DIAGNOSIS — E7800 Pure hypercholesterolemia, unspecified: Secondary | ICD-10-CM

## 2022-06-20 DIAGNOSIS — R739 Hyperglycemia, unspecified: Secondary | ICD-10-CM | POA: Diagnosis not present

## 2022-06-20 LAB — HEPATIC FUNCTION PANEL
ALT: 14 U/L (ref 0–35)
AST: 18 U/L (ref 0–37)
Albumin: 4.5 g/dL (ref 3.5–5.2)
Alkaline Phosphatase: 72 U/L (ref 39–117)
Bilirubin, Direct: 0.2 mg/dL (ref 0.0–0.3)
Total Bilirubin: 1 mg/dL (ref 0.2–1.2)
Total Protein: 6.9 g/dL (ref 6.0–8.3)

## 2022-06-20 LAB — LIPID PANEL
Cholesterol: 141 mg/dL (ref 0–200)
HDL: 59.4 mg/dL (ref >39.00)
LDL Cholesterol: 70 mg/dL (ref 0–99)
NonHDL: 81.14
Total CHOL/HDL Ratio: 2
Triglycerides: 56 mg/dL (ref 0.0–149.0)
VLDL: 11.2 mg/dL (ref 0.0–40.0)

## 2022-06-20 LAB — BASIC METABOLIC PANEL
BUN: 13 mg/dL (ref 6–23)
CO2: 29 mEq/L (ref 19–32)
Calcium: 9.1 mg/dL (ref 8.4–10.5)
Chloride: 106 mEq/L (ref 96–112)
Creatinine, Ser: 0.73 mg/dL (ref 0.40–1.20)
GFR: 82.04 mL/min (ref >60.00)
Glucose, Bld: 101 mg/dL — ABNORMAL HIGH (ref 70–99)
Potassium: 4.3 mEq/L (ref 3.5–5.1)
Sodium: 141 mEq/L (ref 135–145)

## 2022-06-20 LAB — HEMOGLOBIN A1C: Hgb A1c MFr Bld: 5.9 % (ref 4.6–6.5)

## 2022-06-21 ENCOUNTER — Ambulatory Visit (INDEPENDENT_AMBULATORY_CARE_PROVIDER_SITE_OTHER): Payer: Medicare PPO | Admitting: Internal Medicine

## 2022-06-21 ENCOUNTER — Encounter: Payer: Self-pay | Admitting: Internal Medicine

## 2022-06-21 VITALS — BP 126/70 | HR 83 | Temp 98.3°F | Resp 16 | Ht 64.0 in | Wt 142.6 lb

## 2022-06-21 DIAGNOSIS — K824 Cholesterolosis of gallbladder: Secondary | ICD-10-CM

## 2022-06-21 DIAGNOSIS — R7989 Other specified abnormal findings of blood chemistry: Secondary | ICD-10-CM

## 2022-06-21 DIAGNOSIS — Z860100 Personal history of colon polyps, unspecified: Secondary | ICD-10-CM

## 2022-06-21 DIAGNOSIS — Z0000 Encounter for general adult medical examination without abnormal findings: Secondary | ICD-10-CM | POA: Diagnosis not present

## 2022-06-21 DIAGNOSIS — E7800 Pure hypercholesterolemia, unspecified: Secondary | ICD-10-CM | POA: Diagnosis not present

## 2022-06-21 DIAGNOSIS — I10 Essential (primary) hypertension: Secondary | ICD-10-CM

## 2022-06-21 DIAGNOSIS — F439 Reaction to severe stress, unspecified: Secondary | ICD-10-CM | POA: Diagnosis not present

## 2022-06-21 DIAGNOSIS — R945 Abnormal results of liver function studies: Secondary | ICD-10-CM

## 2022-06-21 DIAGNOSIS — R739 Hyperglycemia, unspecified: Secondary | ICD-10-CM

## 2022-06-21 MED ORDER — ROSUVASTATIN CALCIUM 10 MG PO TABS: 10.0000 mg | ORAL_TABLET | Freq: Every day | ORAL | 1 refills | Status: DC

## 2022-06-21 MED ORDER — TELMISARTAN 80 MG PO TABS: 80.0000 mg | ORAL_TABLET | Freq: Every day | ORAL | 1 refills | Status: DC

## 2022-06-21 MED ORDER — SERTRALINE HCL 50 MG PO TABS: 50.0000 mg | ORAL_TABLET | Freq: Every day | ORAL | 1 refills | Status: DC

## 2022-06-21 NOTE — Progress Notes (Signed)
Subjective:    Patient ID: Theresa Francis, female    DOB: [birth year], 72 y.o.   MRN: [id number]  Patient here for  Chief Complaint  Patient presents with   Annual Exam    HPI Here for a physical exam.  Reports doing relatively well.  Some increased stress with her husband's health issues.  Overall she feels she is handling things relatively well.  On zoloft.  Does not feel needs to change.  On micardis. Blood pressure doing well.  No chest pain or sob reported.  No abdominal pain or bowel change reported.  Saw Dr Andee Poles for hoarseness.  Started on omeprazole.  Has helped.  No acid reflux now.     Past Medical History:  Diagnosis Date   Abnormal Pap smear    s/p conization.    Basal cell carcinoma    eye lid   History of chicken pox    History of shingles    Osteoarthritis    Urine incontinence    H/O occasional   Past Surgical History:  Procedure Laterality Date   BREAST BIOPSY Left 11/03/2014   neg./ done by Dr. 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   FINE NEEDLE ASPIRATION Left 2010   Dr Lemar Livings   TONSILLECTOMY  1967   Family History  Problem Relation Age of Onset   Breast cancer Mother 70   Heart disease Father    Breast cancer Sister 17   Cancer Sister 53       angiosarcoma   Uterine cancer Maternal Aunt 59 - 17   Social History   Socioeconomic History   Marital status: Married    Spouse name: Not on file   Number of children: 3   Years of education: Not on file   Highest education level: Not on file  Occupational History   Not on file  Tobacco Use   Smoking status: Never   Smokeless tobacco: Never  Vaping Use   Vaping Use:  Never used  Substance and Sexual Activity   Alcohol use: Yes    Alcohol/week: 0.0 standard drinks of alcohol    Comment: drinks wine occasionally   Drug use: No   Sexual activity: Not on file  Other Topics Concern   Not on file  Social History Narrative   Not on file   Social Determinants of Health   Financial Resource Strain: Low Risk  (12/28/2021)   Overall Financial Resource Strain (CARDIA)    Difficulty of Paying Living Expenses: Not hard at all  Food Insecurity: No Food Insecurity (12/28/2021)   Hunger Vital Sign    Worried About Running Out of Food in the Last Year: Never true    Ran Out of Food in the Last Year: Never true  Transportation Needs: No Transportation Needs (12/28/2021)   PRAPARE - Administrator, Civil Service (Medical): No    Lack of Transportation (Non-Medical): No  Physical Activity: Sufficiently Active (12/28/2021)   Exercise Vital Sign    Days of Exercise per Week: 3 days    Minutes of Exercise per Session: 60 min  Stress: No  Stress Concern Present (12/28/2021)   [employer] of Occupational Health - Occupational Stress Questionnaire    Feeling of Stress : Not at all  Social Connections: Unknown (12/28/2021)   Social Connection and Isolation Panel [NHANES]    Frequency of Communication with Friends and Family: More than three times a week    Frequency of Social Gatherings with Friends and Family: More than three times a week    Attends Religious Services: Not on Marketing executive or Organizations: Not on file    Attends Banker Meetings: Not on file    Marital Status: Not on file     Review of Systems  Constitutional:  Negative for appetite change and unexpected weight change.  HENT:  Negative for congestion and sinus pressure.   Respiratory:  Negative for cough, chest tightness and shortness of breath.   Cardiovascular:  Negative for chest pain, palpitations and leg swelling.  Gastrointestinal:  Negative for  abdominal pain, diarrhea, nausea and vomiting.  Genitourinary:  Negative for difficulty urinating and dysuria.  Musculoskeletal:  Negative for joint swelling and myalgias.  Skin:  Negative for color change and rash.  Neurological:  Negative for dizziness and headaches.  Psychiatric/Behavioral:  Negative for agitation and dysphoric mood.        Objective:     BP 126/70   Pulse 83   Temp 98.3 F (36.8 C)   Resp 16   Ht 5\' 4"  (1.626 m)   Wt 142 lb 9.6 oz (64.7 kg)   SpO2 98%   BMI 24.48 kg/m  Wt Readings from Last 3 Encounters:  06/21/22 142 lb 9.6 oz (64.7 kg)  03/17/22 142 lb 6.4 oz (64.6 kg)  12/28/21 141 lb (64 kg)    Physical Exam Vitals reviewed.  Constitutional:      General: She is not in acute distress.    Appearance: Normal appearance.  HENT:     Head: Normocephalic and atraumatic.     Right Ear: External ear normal.     Left Ear: External ear normal.  Eyes:     General: No scleral icterus.       Right eye: No discharge.        Left eye: No discharge.     Conjunctiva/sclera: Conjunctivae normal.  Neck:     Thyroid: No thyromegaly.  Cardiovascular:     Rate and Rhythm: Normal rate and regular rhythm.  Pulmonary:     Effort: No respiratory distress.     Breath sounds: Normal breath sounds. No wheezing.  Abdominal:     General: Bowel sounds are normal.     Palpations: Abdomen is soft.     Tenderness: There is no abdominal tenderness.  Musculoskeletal:        General: No swelling or tenderness.     Cervical back: Neck supple. No tenderness.  Lymphadenopathy:     Cervical: No cervical adenopathy.  Skin:    Findings: No erythema or rash.  Neurological:     Mental Status: She is alert.  Psychiatric:        Mood and Affect: Mood normal.        Behavior: Behavior normal.      Outpatient Encounter Medications as of 06/21/2022  Medication Sig   omeprazole (PRILOSEC) 20 MG capsule Take 20 mg by mouth every morning.   acetaminophen (TYLENOL) 650 MG CR  tablet Take 1,300 mg by mouth every 6 (six) hours as needed for pain.   Calcium  Carbonate-Vitamin D (CALTRATE 600+D PO) Take by mouth daily.   ondansetron (ZOFRAN) 4 MG tablet TAKE ONE TABLET BY MOUTH TWICE DAILY AS NEEDED FOR NAUSEA OR VOMITING   rosuvastatin (CRESTOR) 10 MG tablet Take 1 tablet (10 mg total) by mouth daily.   sertraline (ZOLOFT) 50 MG tablet Take 1 tablet (50 mg total) by mouth daily.   telmisartan (MICARDIS) 80 MG tablet Take 1 tablet (80 mg total) by mouth daily.   vitamin B-12 (CYANOCOBALAMIN) 100 MCG tablet Take 100 mcg by mouth daily.   [DISCONTINUED] rosuvastatin (CRESTOR) 10 MG tablet Take 1 tablet (10 mg total) by mouth daily.   [DISCONTINUED] sertraline (ZOLOFT) 50 MG tablet Take 1 tablet (50 mg total) by mouth daily.   [DISCONTINUED] telmisartan (MICARDIS) 80 MG tablet Take 1 tablet (80 mg total) by mouth daily.   No facility-administered encounter medications on file as of 06/21/2022.     Lab Results  Component Value Date   WBC 6.6 03/17/2022   HGB 13.3 03/17/2022   HCT 39.9 03/17/2022   PLT 301.0 03/17/2022   GLUCOSE 101 (H) 06/20/2022   CHOL 141 06/20/2022   TRIG 56.0 06/20/2022   HDL 59.40 06/20/2022   LDLDIRECT 130.6 11/26/2012   LDLCALC 70 06/20/2022   ALT 14 06/20/2022   AST 18 06/20/2022   NA 141 06/20/2022   K 4.3 06/20/2022   CL 106 06/20/2022   CREATININE 0.73 06/20/2022   BUN 13 06/20/2022   CO2 29 06/20/2022   TSH 1.58 03/17/2022   HGBA1C 5.9 06/20/2022    DG Bone Density  Result Date: 05/04/2022 EXAM: DUAL X-RAY ABSORPTIOMETRY (DXA) FOR BONE MINERAL DENSITY IMPRESSION: Your patient Lanay Zinda completed a BMD test on 05/04/2022 using the [employer] DXA System (software version: 14.10) manufactured by [employer]. The following summarizes the results of our evaluation. Technologist: SCE PATIENT BIOGRAPHICAL: Name: Christeena, Krogh Patient ID: [id number] Birth Date: Jun 27, 1950 Height: 63.0 in. Gender: Female Exam  Date: 05/04/2022 Weight: 145.3 lbs. Indications: Advanced Age, Bilateral Hip Replacements, Caucasian, Family Hx of Osteoporosis, Height Loss, History of Fracture (Adult), Osteoporotic, Postmenopausal, Vitamin D Deficiency Fractures: Left elbow Treatments: calcium w/ vit D, Multi-Vitamin DENSITOMETRY RESULTS: Site         Region     Measured Date Measured Age WHO Classification Young Adult T-score BMD         %Change vs. Previous Significant Change (*) Left Forearm Radius 33% 05/04/2021 72.5 Oste[age] -1.4 0.755 g/cm2 -1.4% - Left Forearm Radius 33% 05/29/2019 69.5 Osteopenia -1.3 0.766 g/cm2 -3.5% - Left Forearm Radius 33% 2/07/2017 67.3 Normal -0.9 0.794 g/cm2 -5.3% Yes Left Forearm Radius 33% 10/16/2013 63.9 Normal -0.4 0.838 g/cm2 9.5% Yes Left Forearm Radius 33% 02/24/2009 59.3 Osteopenia -1.3 0.765 g/cm2 -3.7% - Left Forearm Radius 33% 02/12/2007 57.2 Normal -0.9 0.794 g/cm2 - - ASSESSMENT: The BMD measured at Forearm Radius 33% is 0.755 g/cm2 with a T-score of -1.4. This patient is considered osteopenic according to World Health Organization [facility]) criteria. The lumbar spine was excluded due to degenerative changes. Bilateral femurs was excluded due to surgical hardware. Compared with prior study, there has been a significant increase in the spine. Compared with prior study, there has been no significant change in the total hip. World Health Organization [facility]) criteria for post-menopausal, Caucasian Women: Normal:                   T-score at or above -1 SD Osteopenia/low bone mass: T-score between -1 and -2.5  SD Osteoporosis:             T-score at or below -2.5 SD RECOMMENDATIONS: 1. All patients should optimize calcium and vitamin D intake. 2. Consider FDA-approved medical therapies in postmenopausal women and men aged 75 years and older, based on the following: a. A hip or vertebral(clinical or morphometric) fracture b. T-score < -2.5 at the femoral neck or spine after appropriate evaluation to exclude  secondary causes c. Low bone mass (T-score between -1.0 and -2.5 at the femoral neck or spine) and a 10-year probability of a hip fracture > 3% or a 10-year probability of a major osteoporosis-related fracture > 20% based on the [place]-adapted WHO algorithm 3. Clinician judgment and/or patient preferences may indicate treatment for people with 10-year fracture probabilities above or below these levels FOLLOW-UP: People with diagnosed cases of osteoporosis or at high risk for fracture should have regular bone mineral density tests. For patients eligible for Medicare, routine testing is allowed once every 2 years. The testing frequency can be increased to one year for patients who have rapidly progressing disease, those who are receiving or discontinuing medical therapy to restore bone mass, or have additional risk factors. I have reviewed this report, and agree with the above findings. [facility] Electronically Signed   By: Romona Curls M.D.   On: 05/04/2022 10:15       Assessment & Plan:  Routine general medical examination at a health care facility  Health care maintenance Assessment & Plan: Physical today 06/21/22.   Colonoscopy 12/2020: The examined portion of the ileum was normal.                       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                       - One 12 mm polyp in the ascending colon, removed with                        mucosal resection. Resected and retrieved.                       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                       - One 10 mm polyp in the descending colon, removed                        with a cold snare. Resected and retrieved.                       - Diverticulosis in the sigmoid colon.                       - Internal hemorrhoids.   Recommended f/u colonoscopy in 3 years.   Mammogram 12/15/21 - Birads I.   Bone density 05/04/22 - osteopenia.    Hyperglycemia Assessment & Plan: Low-carb diet and exercise.  Follow Met  b and A1c.  Lab Results  Component Value Date   HGBA1C 5.9 06/20/2022    Orders: -     Hemoglobin A1c; Future  Hypercholesterolemia Assessment & Plan: Continue crestor.  Low cholesterol diet and exercise.  Follow lipid panel and liver function tests.    Orders: -  Lipid panel; Future -     Hepatic function panel; Future  Hypertension, unspecified type Assessment & Plan: Blood pressure as outlined. On micardis 80mg  q day.  Follow pressures.  Follow metabolic panel.   Orders: -     Basic metabolic panel; Future  Abnormal liver function tests Assessment & Plan: Has had slightly elevated bilirubin previously.  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  Abdominal ultrasound (09/2021)  - unchanged gallbladder polyps. No evidence of cholelithiasis, acute cholecystitis or biliary dilatation. No other significant abnormalities. Follow liver panel.    Gallbladder polyp Assessment & Plan: Ultrasound 09/2021 - no acute abnormality.  Gallbladder polyps - unchanged.  Previously saw GI.    History of colon polyps Assessment & Plan: Colonoscopy 03/2021 - One 12 mm polyp in the ascending colon, removed with mucosal resection. Resected and retrieved.  Post mucosectomy scar in the ascending colon.  One 10 mm polyp in the descending colon, removed with a cold snare. Resected and retrieved.  Diverticulosis in the sigmoid colon.  Internal hemorrhoids. Mucosal resection was performed. Resection and retrieval were complete.  Recommended f/u colonoscopy in 3 years.     Stress Assessment & Plan: Appears to be handling stress relatively well.  Continues on Zoloft.  No changes.  Follow.    Other orders -     Rosuvastatin Calcium; Take 1 tablet (10 mg total) by mouth daily.  Dispense: 90 tablet; Refill: 1 -     Sertraline HCl; Take 1 tablet (50 mg total) by mouth daily.  Dispense: 90 tablet; Refill: 1 -     Telmisartan; Take 1 tablet (80 mg total) by mouth daily.  Dispense: 90 tablet; Refill:  1     Dale Worth, MD

## 2022-06-21 NOTE — Assessment & Plan Note (Signed)
Physical today 06/21/22.   Colonoscopy 12/2020: The examined portion of the ileum was normal.                       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                       - One 12 mm polyp in the ascending colon, removed with                        mucosal resection. Resected and retrieved.                       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                       - One 10 mm polyp in the descending colon, removed                        with a cold snare. Resected and retrieved.                       - Diverticulosis in the sigmoid colon.                       - Internal hemorrhoids.   Recommended f/u colonoscopy in 3 years.   Mammogram 12/15/21 - Birads I.   Bone density 05/04/22 - osteopenia.

## 2022-06-27 ENCOUNTER — Encounter: Payer: Self-pay | Admitting: Internal Medicine

## 2022-06-27 NOTE — Assessment & Plan Note (Signed)
Ultrasound 09/2021 - no acute abnormality.  Gallbladder polyps - unchanged.  Previously saw GI.  

## 2022-06-27 NOTE — Assessment & Plan Note (Signed)
Blood pressure as outlined. On micardis 80mg q day.  Follow pressures.  Follow metabolic panel.  

## 2022-06-27 NOTE — Assessment & Plan Note (Signed)
Appears to be handling stress relatively well.  Continues on Zoloft.  No changes.  Follow.  

## 2022-06-27 NOTE — Assessment & Plan Note (Signed)
Colonoscopy 03/2021 - One 12 mm polyp in the ascending colon, removed with mucosal resection. Resected and retrieved.  Post mucosectomy scar in the ascending colon.  One 10 mm polyp in the descending colon, removed with a cold snare. Resected and retrieved.  Diverticulosis in the sigmoid colon.  Internal hemorrhoids. Mucosal resection was performed. Resection and retrieval were complete.  Recommended f/u colonoscopy in 3 years.   

## 2022-06-27 NOTE — Assessment & Plan Note (Signed)
Low-carb diet and exercise.  Follow Met b and A1c.  Lab Results  Component Value Date   HGBA1C 5.9 06/20/2022

## 2022-06-27 NOTE — Assessment & Plan Note (Signed)
Continue crestor.  Low cholesterol diet and exercise.  Follow lipid panel and liver function tests.  

## 2022-06-27 NOTE — Assessment & Plan Note (Signed)
Has had slightly elevated bilirubin previously.  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  Abdominal ultrasound (09/2021)  - unchanged gallbladder polyps. No evidence of cholelithiasis, acute cholecystitis or biliary dilatation. No other significant abnormalities. Follow liver panel.  

## 2022-07-27 ENCOUNTER — Other Ambulatory Visit (INDEPENDENT_AMBULATORY_CARE_PROVIDER_SITE_OTHER): Payer: Medicare PPO

## 2022-07-27 ENCOUNTER — Encounter: Payer: Self-pay | Admitting: Internal Medicine

## 2022-07-27 DIAGNOSIS — R300 Dysuria: Secondary | ICD-10-CM

## 2022-07-27 NOTE — Telephone Encounter (Signed)
Spoke with patient. Scheduled her for urine sample this PM and virtual with Dr Lorin Picket tomorrow. Urine orders placed.

## 2022-07-28 ENCOUNTER — Other Ambulatory Visit: Payer: Medicare PPO

## 2022-07-28 ENCOUNTER — Encounter: Payer: Self-pay | Admitting: Internal Medicine

## 2022-07-28 ENCOUNTER — Telehealth: Payer: Medicare PPO | Admitting: Internal Medicine

## 2022-07-28 VITALS — Ht 64.0 in | Wt 142.0 lb

## 2022-07-28 DIAGNOSIS — R300 Dysuria: Secondary | ICD-10-CM | POA: Diagnosis not present

## 2022-07-28 LAB — URINALYSIS, ROUTINE W REFLEX MICROSCOPIC
Bilirubin Urine: NEGATIVE
Ketones, ur: NEGATIVE
Nitrite: NEGATIVE
Specific Gravity, Urine: 1.01 (ref 1.000–1.030)
Total Protein, Urine: 30 — AB
Urine Glucose: NEGATIVE
Urobilinogen, UA: 0.2 (ref 0.0–1.0)
pH: 7 (ref 5.0–8.0)

## 2022-07-28 MED ORDER — CEFDINIR 300 MG PO CAPS: 300.0000 mg | ORAL_CAPSULE | Freq: Two times a day (BID) | ORAL | 0 refills | Status: DC

## 2022-07-28 MED ORDER — ROSUVASTATIN CALCIUM 10 MG PO TABS: 10.0000 mg | ORAL_TABLET | Freq: Every day | ORAL | 3 refills | Status: DC

## 2022-07-28 MED ORDER — SERTRALINE HCL 50 MG PO TABS: 50.0000 mg | ORAL_TABLET | Freq: Every day | ORAL | 1 refills | Status: DC

## 2022-07-28 MED ORDER — TELMISARTAN 80 MG PO TABS: 80.0000 mg | ORAL_TABLET | Freq: Every day | ORAL | 3 refills | Status: DC

## 2022-07-28 NOTE — Progress Notes (Deleted)
Subjective:    Patient ID: Theresa Francis, female    DOB: 1950-03-21, 72 y.o.   MRN: [id number]  Patient here for  Chief Complaint  Patient presents with   Urinary Frequency    HPI    Past Medical History:  Diagnosis Date   Abnormal Pap smear    s/p conization.    Basal cell carcinoma    eye lid   History of chicken pox    History of shingles    Osteoarthritis    Urine incontinence    H/O occasional   Past Surgical History:  Procedure Laterality Date   BREAST BIOPSY Left 11/03/2014   neg./ done by Dr. 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   FINE NEEDLE ASPIRATION Left 2010   Dr Lemar Livings   TONSILLECTOMY  1967   Family History  Problem Relation Age of Onset   Breast cancer Mother 56   Heart disease Father    Breast cancer Sister 57   Cancer Sister 31       angiosarcoma   Uterine cancer Maternal Aunt 1 - 63   Social History   Socioeconomic History   Marital status: Married    Spouse name: Not on file   Number of children: 3   Years of education: Not on file   Highest education level: Not on file  Occupational History   Not on file  Tobacco Use   Smoking status: Never   Smokeless tobacco: Never  Vaping Use   Vaping Use: Never used  Substance and Sexual Activity   Alcohol use: Yes    Alcohol/week: 0.0 standard drinks of alcohol    Comment: drinks wine occasionally   Drug use: No   Sexual activity: Not on file  Other Topics Concern   Not on file  Social History Narrative   Not on file   Social Determinants of Health   Financial Resource Strain: Low Risk  (12/28/2021)   Overall Financial Resource Strain (CARDIA)     Difficulty of Paying Living Expenses: Not hard at all  Food Insecurity: No Food Insecurity (12/28/2021)   Hunger Vital Sign    Worried About Running Out of Food in the Last Year: Never true    Ran Out of Food in the Last Year: Never true  Transportation Needs: No Transportation Needs (12/28/2021)   PRAPARE - Administrator, Civil Service (Medical): No    Lack of Transportation (Non-Medical): No  Physical Activity: Sufficiently Active (12/28/2021)   Exercise Vital Sign    Days of Exercise per Week: 3 days    Minutes of Exercise per Session: 60 min  Stress: No Stress Concern Present (12/28/2021)   [employer] of Occupational Health - Occupational Stress Questionnaire    Feeling of Stress : Not at all  Social Connections: Unknown (12/28/2021)   Social Connection and Isolation Panel [NHANES]    Frequency of Communication with Friends and Family: More than three times a week    Frequency of Social Gatherings with Friends and Family: More than three times a week    Attends Religious Services: Not on file  Active Member of Clubs or Organizations: Not on file    Attends Banker Meetings: Not on file    Marital Status: Not on file     Review of Systems     Objective:     Ht 5\' 4"  (1.626 m)   Wt 142 lb (64.4 kg)   BMI 24.37 kg/m  Wt Readings from Last 3 Encounters:  07/28/22 142 lb (64.4 kg)  06/21/22 142 lb 9.6 oz (64.7 kg)  03/17/22 142 lb 6.4 oz (64.6 kg)    Physical Exam   Outpatient Encounter Medications as of 07/28/2022  Medication Sig   acetaminophen (TYLENOL) 650 MG CR tablet Take 1,300 mg by mouth every 6 (six) hours as needed for pain.   Calcium Carbonate-Vitamin D (CALTRATE 600+D PO) Take by mouth daily.   omeprazole (PRILOSEC) 20 MG capsule Take 20 mg by mouth every morning.   ondansetron (ZOFRAN) 4 MG tablet TAKE ONE TABLET BY MOUTH TWICE DAILY AS NEEDED FOR NAUSEA OR VOMITING   rosuvastatin (CRESTOR) 10 MG tablet Take 1 tablet (10 mg  total) by mouth daily.   sertraline (ZOLOFT) 50 MG tablet Take 1 tablet (50 mg total) by mouth daily.   telmisartan (MICARDIS) 80 MG tablet Take 1 tablet (80 mg total) by mouth daily.   vitamin B-12 (CYANOCOBALAMIN) 100 MCG tablet Take 100 mcg by mouth daily.   No facility-administered encounter medications on file as of 07/28/2022.     Lab Results  Component Value Date   WBC 6.6 03/17/2022   HGB 13.3 03/17/2022   HCT 39.9 03/17/2022   PLT 301.0 03/17/2022   GLUCOSE 101 (H) 06/20/2022   CHOL 141 06/20/2022   TRIG 56.0 06/20/2022   HDL 59.40 06/20/2022   LDLDIRECT 130.6 11/26/2012   LDLCALC 70 06/20/2022   ALT 14 06/20/2022   AST 18 06/20/2022   NA 141 06/20/2022   K 4.3 06/20/2022   CL 106 06/20/2022   CREATININE 0.73 06/20/2022   BUN 13 06/20/2022   CO2 29 06/20/2022   TSH 1.58 03/17/2022   HGBA1C 5.9 06/20/2022    DG Bone Density  Result Date: 05/04/2022 EXAM: DUAL X-RAY ABSORPTIOMETRY (DXA) FOR BONE MINERAL DENSITY IMPRESSION: Your patient Theresa Francis completed a BMD test on 05/04/2022 using the [employer] DXA System (software version: 14.10) manufactured by [employer]. The following summarizes the results of our evaluation. Technologist: SCE PATIENT BIOGRAPHICAL: Name: Theresa Francis, Theresa Francis Patient ID: [id number] Birth Date: 03/10/1950 Height: 63.0 in. Gender: Female Exam Date: 05/04/2022 Weight: 145.3 lbs. Indications: Advanced Age, Bilateral Hip Replacements, Caucasian, Family Hx of Osteoporosis, Height Loss, History of Fracture (Adult), Osteoporotic, Postmenopausal, Vitamin D Deficiency Fractures: Left elbow Treatments: calcium w/ vit D, Multi-Vitamin DENSITOMETRY RESULTS: Site         Region     Measured Date Measured Age WHO Classification Young Adult T-score BMD         %Change vs. Previous Significant Change (*) Left Forearm Radius 33% 05/04/2022 72.5 Osteopenia -1.4 0.755 g/cm2 -1.4% - Left Forearm Radius 33% 05/29/2019 69.5 Osteopenia -1.3 0.766 g/cm2  -3.5% - Left Forearm Radius 33% 03/07/2017 67.3 Normal -0.9 0.794 g/cm2 -5.3% Yes Left Forearm Radius 33% 10/16/2013 63.9 Normal -0.4 0.838 g/cm2 9.5% Yes Left Forearm Radius 33% 02/24/2009 59.3 Osteopenia -1.3 0.765 g/cm2 -3.7% - Left Forearm Radius 33% 02/12/2007 57.2 Normal -0.9 0.794 g/cm2 - - ASSESSMENT: The BMD measured at Forearm Radius 33% is 0.755 g/cm2 with a T-score of -1.4. This patient is considered  osteopenic according to World Health Organization [facility]) criteria. The lumbar spine was excluded due to degenerative changes. Bilateral femurs was excluded due to surgical hardware. Compared with prior study, there has been a significant increase in the spine. Compared with prior study, there has been no significant change in the total hip. World Science writer [facility]) criteria for post-menopausal, Caucasian Women: Normal:                   T-score at or above -1 SD Osteopenia/low bone mass: T-score between -1 and -2.5 SD Osteoporosis:             T-score at or below -2.5 SD RECOMMENDATIONS: 1. All patients should optimize calcium and vitamin D intake. 2. Consider FDA-approved medical therapies in postmenopausal women and men aged 35 years and older, based on the following: a. A hip or vertebral(clinical or morphometric) fracture b. T-score < -2.5 at the femoral neck or spine after appropriate evaluation to exclude secondary causes c. Low bone mass (T-score between -1.0 and -2.5 at the femoral neck or spine) and a 10-year probability of a hip fracture > 3% or a 10-year probability of a major osteoporosis-related fracture > 20% based on the [place]-adapted WHO algorithm 3. Clinician judgment and/or patient preferences may indicate treatment for people with 10-year fracture probabilities above or below these levels FOLLOW-UP: People with diagnosed cases of osteoporosis or at high risk for fracture should have regular bone mineral density tests. For patients eligible for Medicare, routine testing is allowed once  every 2 years. The testing frequency can be increased to one year for patients who have rapidly progressing disease, those who are receiving or discontinuing medical therapy to restore bone mass, or have additional risk factors. I have reviewed this report, and agree with the above findings. [facility] Electronically Signed   By: Romona Curls M.D.   On: 05/04/2022 10:15       Assessment & Plan:  There are no diagnoses linked to this encounter.   Dale Browning, MD

## 2022-07-30 ENCOUNTER — Encounter: Payer: Self-pay | Admitting: Internal Medicine

## 2022-07-30 DIAGNOSIS — R300 Dysuria: Secondary | ICD-10-CM | POA: Insufficient documentation

## 2022-07-30 LAB — URINE CULTURE
MICRO NUMBER:: 15044608
SPECIMEN QUALITY:: ADEQUATE

## 2022-07-30 NOTE — Progress Notes (Signed)
Patient ID: Theresa Francis, female   DOB: September 09, 1950, 72 y.o.   MRN: [id number]   Virtual Visit via vide Note  All issues noted in this document were discussed and addressed.  No physical exam was performed (except for noted visual exam findings with Video Visits).   I connected with Theresa Francis by a video enabled telemedicine application and verified that I am speaking with the correct person using two identifiers. Location patient: home Location provider: work  Persons participating in the virtual visit: patient, provider  The limitations, risks, security and privacy concerns of performing an evaluation and management service by video and the availability of in person appointments have been discussed.  It has also been discussed with the patient that there may be a patient responsible charge related to this service. The patient expressed understanding and agreed to proceed.   Reason for visit: work in appt  HPI: Work in with concerns regarding a UTI.  States symptoms started a few days ago.  Noticed increased frequency.  Felt "vibration - pelvic floor" with urination.  Dysuria.  No vaginal symptoms.  No vomiting.  Some back pain.  No hematuria.  Some lower abdominal pressure.     ROS: See pertinent positives and negatives per HPI.  Past Medical History:  Diagnosis Date   Abnormal Pap smear    s/p conization.    Basal cell carcinoma    eye lid   History of chicken pox    History of shingles    Osteoarthritis    Urine incontinence    H/O occasional    Past Surgical History:  Procedure Laterality Date   BREAST BIOPSY Left 11/03/2014   neg./ done by Dr. Lemar Livings in office   BREAST CYST ASPIRATION Left    COLONOSCOPY  2008   Dr Henrene Hawking   COLONOSCOPY WITH PROPOFOL N/A 11/27/2019   Procedure: COLONOSCOPY WITH PROPOFOL;  Surgeon: Pasty Spillers, MD;  Location: [facility] ENDOSCOPY;  Service: Endoscopy;  Laterality: N/A;  PATIENT COVID + on 9/03/202;   COPY ON CHART   EYE  SURGERY Left 1957, 2002   x2 for Strbismis   FINE NEEDLE ASPIRATION Left 2010   Dr Lemar Livings   TONSILLECTOMY  1967    Family History  Problem Relation Age of Onset   Breast cancer Mother 67   Heart disease Father    Breast cancer Sister 67   Cancer Sister 61       angiosarcoma   Uterine cancer Maternal Aunt 14 - 69    SOCIAL HX: reviewed.    Current Outpatient Medications:    cefdinir (OMNICEF) 300 MG capsule, Take 1 capsule (300 mg total) by mouth 2 (two) times daily., Disp: 10 capsule, Rfl: 0   acetaminophen (TYLENOL) 650 MG CR tablet, Take 1,300 mg by mouth every 6 (six) hours as needed for pain., Disp: , Rfl:    Calcium Carbonate-Vitamin D (CALTRATE 600+D PO), Take by mouth daily., Disp: , Rfl:    omeprazole (PRILOSEC) 20 MG capsule, Take 20 mg by mouth every morning., Disp: , Rfl:    ondansetron (ZOFRAN) 4 MG tablet, TAKE ONE TABLET BY MOUTH TWICE DAILY AS NEEDED FOR NAUSEA OR VOMITING, Disp: 15 tablet, Rfl: 0   rosuvastatin (CRESTOR) 10 MG tablet, Take 1 tablet (10 mg total) by mouth daily., Disp: 90 tablet, Rfl: 3   sertraline (ZOLOFT) 50 MG tablet, Take 1 tablet (50 mg total) by mouth daily., Disp: 90 tablet, Rfl: 1   telmisartan (MICARDIS) 80 MG tablet,  Take 1 tablet (80 mg total) by mouth daily., Disp: 90 tablet, Rfl: 3   vitamin B-12 (CYANOCOBALAMIN) 100 MCG tablet, Take 100 mcg by mouth daily., Disp: , Rfl:   EXAM:  GENERAL: alert, oriented, appears well and in no acute distress  HEENT: atraumatic, conjunttiva clear, no obvious abnormalities on inspection of external nose and ears  NECK: normal movements of the head and neck  LUNGS: on inspection no signs of respiratory distress, breathing rate appears normal, no obvious gross SOB, gasping or wheezing  CV: no obvious cyanosis  PSYCH/NEURO: pleasant and cooperative, no obvious depression or anxiety, speech and thought processing grossly intact  ASSESSMENT AND PLAN:  Discussed the following assessment and  plan:  Problem List Items Addressed This Visit     Dysuria - Primary    Symptoms and urinalysis appear to be c/w UTI. Discussed.  Treat with omnicef as directed.  Stay hydrated.  Follow.  Culture pending.  Discussed probiotics.        Return if symptoms worsen or fail to improve, for keep scheduled.   I discussed the assessment and treatment plan with the patient. The patient was provided an opportunity to ask questions and all were answered. The patient agreed with the plan and demonstrated an understanding of the instructions.   The patient was advised to call back or seek an in-person evaluation if the symptoms worsen or if the condition fails to improve as anticipated.    Dale , MD

## 2022-07-30 NOTE — Assessment & Plan Note (Signed)
Symptoms and urinalysis appear to be c/w UTI. Discussed.  Treat with omnicef as directed.  Stay hydrated.  Follow.  Culture pending.  Discussed probiotics.

## 2022-08-01 ENCOUNTER — Telehealth: Payer: Self-pay

## 2022-08-01 NOTE — Telephone Encounter (Signed)
-----   Message from Dale [place], MD sent at 07/30/2022  7:09 PM EDT ----- Sent pt a my chart message regarding her culture results.  Please also call her and confirm doing better.  Also, need to schedule a f/u urinalysis (dx hematuria) to confirm blood clears.  Please schedule 2 weeks after completing the abx.

## 2022-10-06 ENCOUNTER — Encounter (INDEPENDENT_AMBULATORY_CARE_PROVIDER_SITE_OTHER): Payer: Self-pay

## 2022-10-25 ENCOUNTER — Other Ambulatory Visit: Payer: Medicare PPO

## 2022-10-26 ENCOUNTER — Other Ambulatory Visit (INDEPENDENT_AMBULATORY_CARE_PROVIDER_SITE_OTHER): Payer: Medicare PPO

## 2022-10-26 DIAGNOSIS — E7800 Pure hypercholesterolemia, unspecified: Secondary | ICD-10-CM

## 2022-10-26 DIAGNOSIS — R739 Hyperglycemia, unspecified: Secondary | ICD-10-CM | POA: Diagnosis not present

## 2022-10-26 DIAGNOSIS — I10 Essential (primary) hypertension: Secondary | ICD-10-CM

## 2022-10-26 LAB — BASIC METABOLIC PANEL
BUN: 14 mg/dL (ref 6–23)
CO2: 31 meq/L (ref 19–32)
Calcium: 9.3 mg/dL (ref 8.4–10.5)
Chloride: 104 meq/L (ref 96–112)
Creatinine, Ser: 0.74 mg/dL (ref 0.40–1.20)
GFR: 80.51 mL/min (ref >60.00)
Glucose, Bld: 93 mg/dL (ref 70–99)
Potassium: 4.6 meq/L (ref 3.5–5.1)
Sodium: 142 meq/L (ref 135–145)

## 2022-10-26 LAB — HEPATIC FUNCTION PANEL
ALT: 18 U/L (ref 0–35)
AST: 20 U/L (ref 0–37)
Albumin: 4.4 g/dL (ref 3.5–5.2)
Alkaline Phosphatase: 79 U/L (ref 39–117)
Bilirubin, Direct: 0.2 mg/dL (ref 0.0–0.3)
Total Bilirubin: 1.1 mg/dL (ref 0.2–1.2)
Total Protein: 6.9 g/dL (ref 6.0–8.3)

## 2022-10-26 LAB — HEMOGLOBIN A1C: Hgb A1c MFr Bld: 5.9 % (ref 4.6–6.5)

## 2022-10-26 LAB — LIPID PANEL
Cholesterol: 143 mg/dL (ref 0–200)
HDL: 54.7 mg/dL (ref >39.00)
LDL Cholesterol: 74 mg/dL (ref 0–99)
NonHDL: 87.81
Total CHOL/HDL Ratio: 3
Triglycerides: 68 mg/dL (ref 0.0–149.0)
VLDL: 13.6 mg/dL (ref 0.0–40.0)

## 2022-10-28 ENCOUNTER — Encounter: Payer: Self-pay | Admitting: Internal Medicine

## 2022-10-28 ENCOUNTER — Ambulatory Visit: Payer: Medicare PPO | Admitting: Internal Medicine

## 2022-10-28 VITALS — BP 122/72 | HR 75 | Temp 98.6°F | Ht 64.0 in | Wt 147.0 lb

## 2022-10-28 DIAGNOSIS — K824 Cholesterolosis of gallbladder: Secondary | ICD-10-CM

## 2022-10-28 DIAGNOSIS — Z86010 Personal history of colonic polyps: Secondary | ICD-10-CM | POA: Diagnosis not present

## 2022-10-28 DIAGNOSIS — E2839 Other primary ovarian failure: Secondary | ICD-10-CM

## 2022-10-28 DIAGNOSIS — E7800 Pure hypercholesterolemia, unspecified: Secondary | ICD-10-CM

## 2022-10-28 DIAGNOSIS — R7989 Other specified abnormal findings of blood chemistry: Secondary | ICD-10-CM

## 2022-10-28 DIAGNOSIS — Z1231 Encounter for screening mammogram for malignant neoplasm of breast: Secondary | ICD-10-CM | POA: Diagnosis not present

## 2022-10-28 DIAGNOSIS — M8580 Other specified disorders of bone density and structure, unspecified site: Secondary | ICD-10-CM

## 2022-10-28 DIAGNOSIS — K625 Hemorrhage of anus and rectum: Secondary | ICD-10-CM | POA: Diagnosis not present

## 2022-10-28 DIAGNOSIS — I10 Essential (primary) hypertension: Secondary | ICD-10-CM

## 2022-10-28 DIAGNOSIS — R739 Hyperglycemia, unspecified: Secondary | ICD-10-CM | POA: Diagnosis not present

## 2022-10-28 DIAGNOSIS — F439 Reaction to severe stress, unspecified: Secondary | ICD-10-CM | POA: Diagnosis not present

## 2022-10-28 NOTE — Progress Notes (Unsigned)
Subjective:    Patient ID: Ferd Hibbs, female    DOB: 08-14-1950, 72 y.o.   MRN: [id number]  Patient here for  Chief Complaint  Patient presents with   Medication Management    HPI Here to follow up regarding hypercholesterolemia, increased stress and hypertension.  On zoloft. Feels handling things relatively well.  Saw Dr Andee Poles for hoarseness. Started on omeprazole. Has helped. Reports she is doing relatively well.  Tries to stay active.  No chest pain or sob reported.  Previously tried benefiber.  Felt caused increased gas.  Has had some issues with what she felt was hemorrhoids.  Has noticed some blood on a couple of occasions.  Has used preparation H.  Has helped.  Bowels are moving.  No increased straining.  Discussed f/u with GI. Also reports bowels have change - form/shape.     Past Medical History:  Diagnosis Date   Abnormal Pap smear    s/p conization.    Basal cell carcinoma    eye lid   History of chicken pox    History of shingles    Osteoarthritis    Urine incontinence    H/O occasional   Past Surgical History:  Procedure Laterality Date   BREAST BIOPSY Left 11/03/2014   neg./ done by Dr. 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   FINE NEEDLE ASPIRATION Left 2010   Dr Lemar Livings   TONSILLECTOMY  1967   Family History  Problem Relation Age of Onset   Breast cancer Mother 51   Heart disease Father    Breast cancer Sister 19   Cancer Sister 75       angiosarcoma   Uterine cancer Maternal Aunt 54 - 53   Social History   Socioeconomic History   Marital status: Married    Spouse name: Not on file   Number of children: 3   Years of  education: Not on file   Highest education level: Master's degree (e.g., MA, MS, MEng, MEd, MSW, MBA)  Occupational History   Not on file  Tobacco Use   Smoking status: Never   Smokeless tobacco: Never  Vaping Use   Vaping status: Never Used  Substance and Sexual Activity   Alcohol use: Yes    Alcohol/week: 0.0 standard drinks of alcohol    Comment: drinks wine occasionally   Drug use: No   Sexual activity: Not on file  Other Topics Concern   Not on file  Social History Narrative   Not on file   Social Determinants of Health   Financial Resource Strain: Low Risk  (10/24/2022)   Overall Financial Resource Strain (CARDIA)    Difficulty of Paying Living Expenses: Not hard at all  Food Insecurity: No Food Insecurity (10/24/2022)   Hunger Vital Sign    Worried About Running Out of Food in the Last Year: Never true    Ran Out of Food in the Last Year: Never true  Transportation Needs: No Transportation Needs (10/24/2022)   PRAPARE - Administrator, Civil Service (Medical): No    Lack of Transportation (Non-Medical): No  Physical  Activity: Sufficiently Active (10/24/2022)   Exercise Vital Sign    Days of Exercise per Week: 5 days    Minutes of Exercise per Session: 40 min  Stress: No Stress Concern Present (10/24/2022)   [employer] of Occupational Health - Occupational Stress Questionnaire    Feeling of Stress : Only a little  Social Connections: Socially Integrated (10/24/2022)   Social Connection and Isolation Panel [NHANES]    Frequency of Communication with Friends and Family: More than three times a week    Frequency of Social Gatherings with Friends and Family: Once a week    Attends Religious Services: More than 4 times per year    Active Member of [employer] or Organizations: Yes    Attends Engineer, structural: More than 4 times per year    Marital Status: Married     Review of Systems  Constitutional:  Negative for appetite change and unexpected weight  change.  HENT:  Negative for congestion and sinus pressure.   Respiratory:  Negative for cough, chest tightness and shortness of breath.   Cardiovascular:  Negative for chest pain, palpitations and leg swelling.  Gastrointestinal:  Negative for abdominal pain, diarrhea, nausea and vomiting.  Genitourinary:  Negative for difficulty urinating and dysuria.  Musculoskeletal:  Negative for joint swelling and myalgias.  Skin:  Negative for color change and rash.  Neurological:  Negative for dizziness and headaches.  Psychiatric/Behavioral:  Negative for agitation and dysphoric mood.        Objective:     BP 122/72   Pulse 75   Temp 98.6 F (37 C) (Oral)   Ht 5\' 4"  (1.626 m)   Wt 147 lb (66.7 kg)   SpO2 98%   BMI 25.23 kg/m  Wt Readings from Last 3 Encounters:  10/28/22 147 lb (66.7 kg)  07/28/22 142 lb (64.4 kg)  06/21/22 142 lb 9.6 oz (64.7 kg)    Physical Exam Vitals reviewed.  Constitutional:      General: She is not in acute distress.    Appearance: Normal appearance.  HENT:     Head: Normocephalic and atraumatic.     Right Ear: External ear normal.     Left Ear: External ear normal.  Eyes:     General: No scleral icterus.       Right eye: No discharge.        Left eye: No discharge.     Conjunctiva/sclera: Conjunctivae normal.  Neck:     Thyroid: No thyromegaly.  Cardiovascular:     Rate and Rhythm: Normal rate and regular rhythm.  Pulmonary:     Effort: No respiratory distress.     Breath sounds: Normal breath sounds. No wheezing.  Abdominal:     General: Bowel sounds are normal.     Palpations: Abdomen is soft.     Tenderness: There is no abdominal tenderness.  Genitourinary:    Comments: Rectal exam - heme negative.  No thrombosed hemorrhoid.  Musculoskeletal:        General: No swelling or tenderness.     Cervical back: Neck supple. No tenderness.  Lymphadenopathy:     Cervical: No cervical adenopathy.  Skin:    Findings: No erythema or rash.   Neurological:     Mental Status: She is alert.  Psychiatric:        Mood and Affect: Mood normal.        Behavior: Behavior normal.      Outpatient Encounter Medications as of 10/28/2022  Medication Sig   acetaminophen (TYLENOL) 650 MG CR tablet Take 1,300 mg by mouth every 6 (six) hours as needed for pain.   Calcium Carbonate-Vitamin D (CALTRATE 600+D PO) Take by mouth daily.   omeprazole (PRILOSEC) 20 MG capsule Take 20 mg by mouth every morning.   ondansetron (ZOFRAN) 4 MG tablet TAKE ONE TABLET BY MOUTH TWICE DAILY AS NEEDED FOR NAUSEA OR VOMITING   rosuvastatin (CRESTOR) 10 MG tablet Take 1 tablet (10 mg total) by mouth daily.   sertraline (ZOLOFT) 50 MG tablet Take 1 tablet (50 mg total) by mouth daily.   telmisartan (MICARDIS) 80 MG tablet Take 1 tablet (80 mg total) by mouth daily.   vitamin B-12 (CYANOCOBALAMIN) 100 MCG tablet Take 100 mcg by mouth daily.   [DISCONTINUED] cefdinir (OMNICEF) 300 MG capsule Take 1 capsule (300 mg total) by mouth 2 (two) times daily.   No facility-administered encounter medications on file as of 10/28/2022.     Lab Results  Component Value Date   WBC 6.6 03/17/2022   HGB 13.3 03/17/2022   HCT 39.9 03/17/2022   PLT 301.0 03/17/2022   GLUCOSE 93 10/26/2022   CHOL 143 10/26/2022   TRIG 68.0 10/26/2022   HDL 54.70 10/26/2022   LDLDIRECT 130.6 11/26/2012   LDLCALC 74 10/26/2022   ALT 18 10/26/2022   AST 20 10/26/2022   NA 142 10/26/2022   K 4.6 10/26/2022   CL 104 10/26/2022   CREATININE 0.74 10/26/2022   BUN 14 10/26/2022   CO2 31 10/26/2022   TSH 1.58 03/17/2022   HGBA1C 5.9 10/26/2022    DG Bone Density  Result Date: 05/04/2022 EXAM: DUAL X-RAY ABSORPTIOMETRY (DXA) FOR BONE MINERAL DENSITY IMPRESSION: Your patient Keley Moghadam completed a BMD test on 05/04/2022 using the [employer] DXA System (software version: 14.10) manufactured by [employer]. The following summarizes the results of our evaluation.  Technologist: SCE PATIENT BIOGRAPHICAL: Name: Bernetha, Dreyfuss Patient ID: [id number] Birth Date: 1950-08-16 Height: 63.0 in. Gender: Female Exam Date: 05/04/2022 Weight: 145.3 lbs. Indications: Advanced Age, Bilateral Hip Replacements, Caucasian, Family Hx of Osteoporosis, Height Loss, History of Fracture (Adult), Osteoporotic, Postmenopausal, Vitamin D Deficiency Fractures: Left elbow Treatments: calcium w/ vit D, Multi-Vitamin DENSITOMETRY RESULTS: Site         Region     Measured Date Measured Age WHO Classification Young Adult T-score BMD         %Change vs. Previous Significant Change (*) Left Forearm Radius 33% 05/04/2021 72.5 Oste[age] -1.4 0.755 g/cm2 -1.4% - Left Forearm Radius 33% 05/29/2019 69.5 Osteopenia -1.3 0.766 g/cm2 -3.5% - Left Forearm Radius 33% 03/07/2017 67.3 Normal -0.9 0.794 g/cm2 -5.3% Yes Left Forearm Radius 33% 10/16/2013 63.9 Normal -0.4 0.838 g/cm2 9.5% Yes Left Forearm Radius 33% 02/24/2009 59.3 Osteopenia -1.3 0.765 g/cm2 -3.7% - Left Forearm Radius 33% 02/12/2007 57.2 Normal -0.9 0.794 g/cm2 - - ASSESSMENT: The BMD measured at Forearm Radius 33% is 0.755 g/cm2 with a T-score of -1.4. This patient is considered osteopenic according to World Health Organization [facility]) criteria. The lumbar spine was excluded due to degenerative changes. Bilateral femurs was excluded due to surgical hardware. Compared with prior study, there has been a significant increase in the spine. Compared with prior study, there has been no significant change in the total hip. World Health Organization [facility]) criteria for post-menopausal, Caucasian Women: Normal:                   T-score at or above -1 SD  Osteopenia/low bone mass: T-score between -1 and -2.5 SD Osteoporosis:             T-score at or below -2.5 SD RECOMMENDATIONS: 1. All patients should optimize calcium and vitamin D intake. 2. Consider FDA-approved medical therapies in postmenopausal women and men aged 19 years and older, based on the  following: a. A hip or vertebral(clinical or morphometric) fracture b. T-score < -2.5 at the femoral neck or spine after appropriate evaluation to exclude secondary causes c. Low bone mass (T-score between -1.0 and -2.5 at the femoral neck or spine) and a 10-year probability of a hip fracture > 3% or a 10-year probability of a major osteoporosis-related fracture > 20% based on the [place]-adapted WHO algorithm 3. Clinician judgment and/or patient preferences may indicate treatment for people with 10-year fracture probabilities above or below these levels FOLLOW-UP: People with diagnosed cases of osteoporosis or at high risk for fracture should have regular bone mineral density tests. For patients eligible for Medicare, routine testing is allowed once every 2 years. The testing frequency can be increased to one year for patients who have rapidly progressing disease, those who are receiving or discontinuing medical therapy to restore bone mass, or have additional risk factors. I have reviewed this report, and agree with the above findings. [facility] Electronically Signed   By: Romona Curls M.D.   On: 05/04/2022 10:15       Assessment & Plan:  Hypercholesterolemia Assessment & Plan: Continue crestor.  Low cholesterol diet and exercise.  Follow lipid panel and liver function tests.    Orders: -     Lipid panel; Future -     Hepatic function panel; Future  Hyperglycemia Assessment & Plan: Low-carb diet and exercise.  Follow Met b and A1c.  Lab Results  Component Value Date   HGBA1C 5.9 10/26/2022    Orders: -     Hemoglobin A1c; Future  Hypertension, unspecified type Assessment & Plan: Blood pressure as outlined. Doing well. On micardis 80mg  q day.  Follow pressures.  Follow metabolic panel.   Orders: -     CBC with Differential/Platelet; Future -     Basic metabolic panel; Future -     TSH; Future  Visit for screening mammogram -     3D Screening Mammogram, Left and Right;  Future  Estrogen deficiency  Rectal bleeding Assessment & Plan: Colonoscopy 03/2021 - One 12 mm polyp in the ascending colon, removed with mucosal resection. Resected and retrieved.  Post mucosectomy scar in the ascending colon.  One 10 mm polyp in the descending colon, removed with a cold snare. Resected and retrieved.  Diverticulosis in the sigmoid colon.  Internal hemorrhoids. Mucosal resection was performed. Resection and retrieval were complete.  Recommended f/u colonoscopy in 3 years. Given bowel change will refer back to GI for further evaluation.  Has had a couple of episodes of noticing blood when wiping.  Attributed to hemorrhoids.  Better now.  GI f/u as outlined.   Orders: -     Ambulatory referral to Gastroenterology  Stress Assessment & Plan: Appears to be handling stress relatively well.  Continues on Zoloft.  No changes.  Follow.    Osteopenia, unspecified location Assessment & Plan: Bone density 04/2022 - osteopenia (T score -1.4).  continue calcium, vitamin D and weight bearing exercise.     History of colon polyps Assessment & Plan: Colonoscopy 03/2021 - One 12 mm polyp in the ascending colon, removed with mucosal resection. Resected and  retrieved.  Post mucosectomy scar in the ascending colon.  One 10 mm polyp in the descending colon, removed with a cold snare. Resected and retrieved.  Diverticulosis in the sigmoid colon.  Internal hemorrhoids. Mucosal resection was performed. Resection and retrieval were complete.  Recommended f/u colonoscopy in 3 years. Given bowel change will refer back to GI for further evaluation.  Has had a couple of episodes of noticing blood when wiping.  Attributed to hemorrhoids.  Better now.  GI f/u as outlined.     Gallbladder polyp Assessment & Plan: Ultrasound 09/2021 - no acute abnormality.  Gallbladder polyps - unchanged.  Previously saw GI.    Abnormal liver function tests Assessment & Plan: Has had slightly elevated bilirubin  previously.  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  Abdominal ultrasound (09/2021)  - unchanged gallbladder polyps. No evidence of cholelithiasis, acute cholecystitis or biliary dilatation. No other significant abnormalities. Follow liver panel.  Lab Results  Component Value Date   ALT 18 10/26/2022   AST 20 10/26/2022   ALKPHOS 79 10/26/2022   BILITOT 1.1 10/26/2022         Dale Carmen, MD

## 2022-10-30 ENCOUNTER — Encounter: Payer: Self-pay | Admitting: Internal Medicine

## 2022-10-30 DIAGNOSIS — K625 Hemorrhage of anus and rectum: Secondary | ICD-10-CM | POA: Insufficient documentation

## 2022-10-30 NOTE — Assessment & Plan Note (Signed)
Low-carb diet and exercise.  Follow Met b and A1c.  Lab Results  Component Value Date   HGBA1C 5.9 10/26/2022

## 2022-10-30 NOTE — Assessment & Plan Note (Signed)
Continue crestor.  Low cholesterol diet and exercise.  Follow lipid panel and liver function tests.  

## 2022-10-30 NOTE — Assessment & Plan Note (Signed)
Colonoscopy 03/2021 - One 12 mm polyp in the ascending colon, removed with mucosal resection. Resected and retrieved.  Post mucosectomy scar in the ascending colon.  One 10 mm polyp in the descending colon, removed with a cold snare. Resected and retrieved.  Diverticulosis in the sigmoid colon.  Internal hemorrhoids. Mucosal resection was performed. Resection and retrieval were complete.  Recommended f/u colonoscopy in 3 years. Given bowel change will refer back to GI for further evaluation.  Has had a couple of episodes of noticing blood when wiping.  Attributed to hemorrhoids.  Better now.  GI f/u as outlined.

## 2022-10-30 NOTE — Assessment & Plan Note (Signed)
Blood pressure as outlined. Doing well. On micardis 80mg  q day.  Follow pressures.  Follow metabolic panel.

## 2022-10-30 NOTE — Assessment & Plan Note (Signed)
Ultrasound 09/2021 - no acute abnormality.  Gallbladder polyps - unchanged.  Previously saw GI.  

## 2022-10-30 NOTE — Assessment & Plan Note (Addendum)
Has had slightly elevated bilirubin previously.  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  Abdominal ultrasound (09/2021)  - unchanged gallbladder polyps. No evidence of cholelithiasis, acute cholecystitis or biliary dilatation. No other significant abnormalities. Follow liver panel.  Lab Results  Component Value Date   ALT 18 10/26/2022   AST 20 10/26/2022   ALKPHOS 79 10/26/2022   BILITOT 1.1 10/26/2022

## 2022-10-30 NOTE — Assessment & Plan Note (Signed)
Appears to be handling stress relatively well.  Continues on Zoloft.  No changes.  Follow.  

## 2022-10-30 NOTE — Assessment & Plan Note (Signed)
Bone density 04/2022 - osteopenia (T score -1.4).  continue calcium, vitamin D and weight bearing exercise.

## 2022-12-07 ENCOUNTER — Encounter: Payer: Self-pay | Admitting: Internal Medicine

## 2022-12-07 DIAGNOSIS — R151 Fecal smearing: Secondary | ICD-10-CM | POA: Diagnosis not present

## 2022-12-07 DIAGNOSIS — K921 Melena: Secondary | ICD-10-CM | POA: Insufficient documentation

## 2022-12-07 DIAGNOSIS — R110 Nausea: Secondary | ICD-10-CM | POA: Diagnosis not present

## 2022-12-19 ENCOUNTER — Ambulatory Visit
Admission: RE | Admit: 2022-12-19 | Discharge: 2022-12-19 | Disposition: A | Discharge status: Home / Self Care | Payer: Medicare PPO | Source: Ambulatory Visit | Attending: Internal Medicine | Admitting: Internal Medicine

## 2022-12-19 DIAGNOSIS — Z1231 Encounter for screening mammogram for malignant neoplasm of breast: Secondary | ICD-10-CM | POA: Diagnosis not present

## 2022-12-21 ENCOUNTER — Other Ambulatory Visit: Payer: Self-pay | Admitting: Internal Medicine

## 2022-12-21 DIAGNOSIS — R928 Other abnormal and inconclusive findings on diagnostic imaging of breast: Secondary | ICD-10-CM

## 2022-12-21 NOTE — Progress Notes (Signed)
Order placed for right breast diagnostic mammogram and ultrasound.

## 2022-12-28 ENCOUNTER — Ambulatory Visit
Admission: RE | Admit: 2022-12-28 | Discharge: 2022-12-28 | Disposition: A | Discharge status: Home / Self Care | Payer: Medicare PPO | Source: Ambulatory Visit | Attending: Internal Medicine | Admitting: Internal Medicine

## 2022-12-28 DIAGNOSIS — R92333 Mammographic heterogeneous density, bilateral breasts: Secondary | ICD-10-CM | POA: Diagnosis not present

## 2022-12-28 DIAGNOSIS — R928 Other abnormal and inconclusive findings on diagnostic imaging of breast: Secondary | ICD-10-CM

## 2022-12-28 DIAGNOSIS — N6311 Unspecified lump in the right breast, upper outer quadrant: Secondary | ICD-10-CM | POA: Diagnosis not present

## 2023-01-03 ENCOUNTER — Ambulatory Visit (INDEPENDENT_AMBULATORY_CARE_PROVIDER_SITE_OTHER): Payer: Medicare PPO | Admitting: *Deleted

## 2023-01-03 VITALS — Ht 64.0 in | Wt 145.0 lb

## 2023-01-03 DIAGNOSIS — Z0000 Encounter for general adult medical examination without abnormal findings: Secondary | ICD-10-CM | POA: Diagnosis not present

## 2023-01-03 NOTE — Patient Instructions (Signed)
Theresa Francis , Thank you for taking time to come for your Medicare Wellness Visit. I appreciate your ongoing commitment to your health goals. Please review the following plan we discussed and let me know if I can assist you in the future.   Referrals/Orders/Follow-Ups/Clinician Recommendations: None  This is a list of the screening recommended for you and due dates:  Health Maintenance  Topic Date Due   COVID-19 Vaccine (7 - 2023-24 season) 03/19/2023   Medicare Annual Wellness Visit  01/03/2024   Colon Cancer Screening  04/14/2024   Mammogram  12/18/2024   DTaP/Tdap/Td vaccine (2 - Td or Tdap) 05/07/2031   Pneumonia Vaccine  Completed   Flu Shot  Completed   DEXA scan (bone density measurement)  Completed   Hepatitis C Screening  Completed   Zoster (Shingles) Vaccine  Completed   HPV Vaccine  Aged Out   Cologuard (Stool DNA test)  Discontinued    Advanced directives: (Copy Requested) Please bring a copy of your health care power of attorney and living will to the office to be added to your chart at your convenience.  Next Medicare Annual Wellness Visit scheduled for next year: Yes 01/09/24 @ 9:30

## 2023-01-03 NOTE — Progress Notes (Signed)
Subjective:   Theresa Francis is a 72 y.o. female who presents for Medicare Annual (Subsequent) preventive examination.  Visit Complete: Virtual I connected with  Ferd Hibbs on 01/03/23 by a audio enabled telemedicine application and verified that I am speaking with the correct person using two identifiers.  [place]  Provider Location: Office/Clinic  I discussed the limitations of evaluation and management by telemedicine. The patient expressed understanding and agreed to proceed.  Vital Signs: Because this visit was a virtual/telehealth visit, some criteria may be missing or patient reported. Any vitals not documented were not able to be obtained and vitals that have been documented are patient reported.  Patient Medicare AWV questionnaire was completed by the patient on 01/02/23; I have confirmed that all information answered by patient is correct and no changes since this date.  Cardiac Risk Factors include: advanced age (>40men, >67 women);hypertension;dyslipidemia     Objective:    Today's Vitals   01/03/23 0945  Weight: 145 lb (65.8 kg)  Height: 5\' 4"  (1.626 m)   Body mass index is 24.89 kg/m.     01/03/2023    9:55 AM 12/28/2021    8:34 AM 11/04/2020    9:32 AM 11/27/2019   10:54 AM 09/14/2018    8:29 AM 09/12/2017    9:00 AM  Advanced Directives  Does Patient Have a Medical Advance Directive? Yes Yes Yes Yes Yes No  Type of Estate agent of [place];Living will Healthcare Power of [place];Living will Healthcare Power of [place];Living will Living will Healthcare Power of [place];Living will   Does patient want to make changes to medical advance directive?  No - Patient declined No - Patient declined  No - Patient declined   Copy of Healthcare Power of Attorney in Chart? No - copy requested No - copy requested No - copy requested  No - copy requested   Would patient like information on creating a  medical advance directive?      Yes (MAU/Ambulatory/Procedural Areas - Information given)    Current Medications (verified) Outpatient Encounter Medications as of 01/03/2023  Medication Sig   acetaminophen (TYLENOL) 650 MG CR tablet Take 1,300 mg by mouth every 6 (six) hours as needed for pain.   Calcium Carbonate-Vitamin D (CALTRATE 600+D PO) Take by mouth daily.   omeprazole (PRILOSEC) 20 MG capsule Take 20 mg by mouth every morning.   ondansetron (ZOFRAN) 4 MG tablet TAKE ONE TABLET BY MOUTH TWICE DAILY AS NEEDED FOR NAUSEA OR VOMITING   rosuvastatin (CRESTOR) 10 MG tablet Take 1 tablet (10 mg total) by mouth daily.   sertraline (ZOLOFT) 50 MG tablet Take 1 tablet (50 mg total) by mouth daily.   telmisartan (MICARDIS) 80 MG tablet Take 1 tablet (80 mg total) by mouth daily.   vitamin B-12 (CYANOCOBALAMIN) 100 MCG tablet Take 100 mcg by mouth daily.   No facility-administered encounter medications on file as of 01/03/2023.    Allergies (verified) Patient has no known allergies.   History: Past Medical History:  Diagnosis Date   Abnormal Pap smear    s/p conization.    Basal cell carcinoma    eye lid   History of chicken pox    History of shingles    Osteoarthritis    Urine incontinence    H/O occasional   Past Surgical History:  Procedure Laterality Date   BREAST BIOPSY Left 11/03/2014   neg./ done by Dr. Lemar Livings in office  BREAST CYST ASPIRATION Left    COLONOSCOPY  2008   Dr Henrene Hawking   COLONOSCOPY WITH PROPOFOL N/A 11/27/2019   Procedure: COLONOSCOPY WITH PROPOFOL;  Surgeon: Pasty Spillers, MD;  Location: [facility] ENDOSCOPY;  Service: Endoscopy;  Laterality: N/A;  PATIENT COVID + on 9/03/202;   COPY ON CHART   EYE SURGERY Left 1957, 2002   x2 for Strbismis   FINE NEEDLE ASPIRATION Left 2010   Dr Lemar Livings   TONSILLECTOMY  1967   Family History  Problem Relation Age of Onset   Breast cancer Mother 60   Heart disease Father    Breast cancer Sister 53   Cancer  Sister 45       angiosarcoma   Uterine cancer Maternal Aunt 28 - 23   Social History   Socioeconomic History   Marital status: Married    Spouse name: Not on file   Number of children: 3   Years of education: Not on file   Highest education level: Master's degree (e.g., MA, MS, MEng, MEd, MSW, MBA)  Occupational History   Not on file  Tobacco Use   Smoking status: Never   Smokeless tobacco: Never  Vaping Use   Vaping status: Never Used  Substance and Sexual Activity   Alcohol use: Yes    Alcohol/week: 0.0 standard drinks of alcohol    Comment: drinks wine occasionally   Drug use: No   Sexual activity: Not on file  Other Topics Concern   Not on file  Social History Narrative   Married   Social Determinants of Health   Financial Resource Strain: Low Risk  (01/02/2023)   Overall Financial Resource Strain (CARDIA)    Difficulty of Paying Living Expenses: Not hard at all  Food Insecurity: No Food Insecurity (01/02/2023)   Hunger Vital Sign    Worried About Running Out of Food in the Last Year: Never true    Ran Out of Food in the Last Year: Never true  Transportation Needs: No Transportation Needs (01/02/2023)   PRAPARE - Administrator, Civil Service (Medical): No    Lack of Transportation (Non-Medical): No  Physical Activity: Sufficiently Active (01/02/2023)   Exercise Vital Sign    Days of Exercise per Week: 5 days    Minutes of Exercise per Session: 40 min  Stress: No Stress Concern Present (01/02/2023)   [employer] of Occupational Health - Occupational Stress Questionnaire    Feeling of Stress : Only a little  Social Connections: Socially Integrated (01/02/2023)   Social Connection and Isolation Panel [NHANES]    Frequency of Communication with Friends and Family: Once a week    Frequency of Social Gatherings with Friends and Family: Twice a week    Attends Religious Services: More than 4 times per year    Active Member of [employer] or  Organizations: Yes    Attends Banker Meetings: 1 to 4 times per year    Marital Status: Married    Tobacco Counseling Counseling given: Not Answered   Clinical Intake:  Pre-visit preparation completed: Yes  Pain : No/denies pain     BMI - recorded: 24.89 Nutritional Status: BMI of 19-24  Normal Nutritional Risks: None Diabetes: No  How often do you need to have someone help you when you read instructions, pamphlets, or other written materials from your doctor or pharmacy?: 1 - Never  Interpreter Needed?: No  Information entered by :: R. Lakesha Levinson LPN   Activities of Daily Living  01/03/2023    9:47 AM 01/02/2023   10:33 AM  In your present state of health, do you have any difficulty performing the following activities:  Hearing? 0 0  Vision? 0 0  Comment glasses   Difficulty concentrating or making decisions? 0 0  Walking or climbing stairs? 0 0  Dressing or bathing? 0 0  Doing errands, shopping? 0 0  Preparing Food and eating ? N N  Using the Toilet? N N  In the past six months, have you accidently leaked urine? N Y  Do you have problems with loss of bowel control? N N  Managing your Medications? N N  Managing your Finances? N N  Housekeeping or managing your Housekeeping? N N    Patient Care Team: Dale Ramirez-Perez, MD as PCP - General (Internal Medicine) Dale [place], MD (Internal Medicine) Lemar Livings Merrily Pew, MD (General Surgery)  Indicate any recent Medical Services you may have received from other than [facility] providers in the past year (date may be approximate).     Assessment:   This is a routine wellness examination for [facility].  Hearing/Vision screen Hearing Screening - Comments:: No issues Vision Screening - Comments:: glasses   Goals Addressed             This Visit's Progress    Patient Stated       Wants to lose some weight and exercise more       Depression Screen    01/03/2023    9:51 AM 10/28/2022    9:00 AM  03/17/2022   10:07 AM 12/28/2021    8:39 AM 12/14/2021    9:12 AM 09/13/2021    8:09 AM 06/04/2021    1:39 PM  PHQ 2/9 Scores  PHQ - 2 Score 0 0 0 1 1 0 0  PHQ- 9 Score 0 4 2        Fall Risk    01/02/2023   10:33 AM 12/24/2022    8:56 AM 10/28/2022    8:59 AM 03/17/2022   10:07 AM 12/28/2021    8:39 AM  Fall Risk   Falls in the past year? 0 0 0 0 0  Number falls in past yr: 0 0 0 0 0  Injury with Fall? 0 0 0 0 0  Risk for fall due to : No Fall Risks  No Fall Risks No Fall Risks No Fall Risks  Follow up Falls prevention discussed;Falls evaluation completed  Falls evaluation completed Falls evaluation completed Falls evaluation completed;Falls prevention discussed    MEDICARE RISK AT HOME: Medicare Risk at Home Any stairs in or around the home?: Yes If so, are there any without handrails?: No Home free of loose throw rugs in walkways, pet beds, electrical cords, etc?: Yes Adequate lighting in your home to reduce risk of falls?: Yes Life alert?: No Use of a cane, walker or w/c?: No Grab bars in the bathroom?: No Shower chair or bench in shower?: No Elevated toilet seat or a handicapped toilet?: No    Cognitive Function:    09/12/2017    9:08 AM  MMSE - Mini Mental State Exam  Orientation to time 5  Orientation to Place 5  Registration 3  Attention/ Calculation 5  Recall 3  Language- name 2 objects 2  Language- repeat 1  Language- follow 3 step command 3  Language- read & follow direction 1  Write a sentence 1  Copy design 1  Total score 30  01/03/2023    9:56 AM 12/28/2021    8:42 AM 09/14/2018    8:32 AM  6CIT Screen  What Year? 0 points 0 points 0 points  What month? 0 points 0 points 0 points  What time? 0 points 0 points 0 points  Count back from 20 0 points 0 points 0 points  Months in reverse 0 points 0 points 0 points  Repeat phrase 0 points 0 points 0 points  Total Score 0 points 0 points 0 points    Immunizations Immunization History   Administered Date(s) Administered   Fluad Quad(high Dose 65+) 12/10/2021   Influenza Split 11/22/2012, 11/19/2013   Influenza Whole 11/21/2016   Influenza, High Dose Seasonal PF 10/28/2015, 11/19/2016, 11/15/2018, 12/22/2020, 12/10/2021   Influenza,inj,Quad PF,6+ Mos 12/03/2014, 12/06/2017   Influenza-Unspecified 12/10/2014, 12/06/2017, 11/18/2019   PFIZER Comirnaty(Gray Top)Covid-19 Tri-Sucrose Vaccine 11/29/2021   PFIZER(Purple Top)SARS-COV-2 Vaccination 02/25/2019, 03/18/2019, 01/24/2020   Pfizer Covid-19 Vaccine Bivalent Booster 10yrs & up 12/22/2020   Pneumococcal Conjugate-13 12/19/2016   Pneumococcal Polysaccharide-23 11/23/2017   Tdap 05/06/2021   Zoster Recombinant(Shingrix) 10/18/2017, 01/05/2018    TDAP status: Up to date  Flu Vaccine status: Up to date  Pneumococcal vaccine status: Up to date  Covid-19 vaccine status: Completed vaccines  Qualifies for Shingles Vaccine? Yes   Zostavax completed No   Shingrix Completed?: Yes  Screening Tests Health Maintenance  Topic Date Due   COVID-19 Vaccine (6 - 2023-24 season) 10/23/2022   Medicare Annual Wellness (AWV)  12/29/2022   INFLUENZA VACCINE  05/22/2023 (Originally 09/22/2022)   Colonoscopy  04/14/2024   MAMMOGRAM  12/18/2024   DTaP/Tdap/Td (2 - Td or Tdap) 05/07/2031   Pneumonia Vaccine 68+ Years old  Completed   DEXA SCAN  Completed   Hepatitis C Screening  Completed   Zoster Vaccines- Shingrix  Completed   HPV VACCINES  Aged Out   Fecal DNA (Cologuard)  Discontinued    Health Maintenance  Health Maintenance Due  Topic Date Due   COVID-19 Vaccine (6 - 2023-24 season) 10/23/2022   Medicare Annual Wellness (AWV)  12/29/2022    Colorectal cancer screening: Type of screening: Colonoscopy. Completed 03/2021. Repeat every 3 years  Mammogram status: Completed 11/2022. Repeat every year  Bone Density status: Completed 04/2022. Results reflect: Bone density results: OSTEOPENIA. Repeat every 2 years.  Lung  Cancer Screening: (Low Dose CT Chest recommended if Age 67-80 years, 20 pack-year currently smoking OR have quit w/in 15years.) does not qualify.     Additional Screening:  Hepatitis C Screening: does qualify; Completed 08/2017  Vision Screening: Recommended annual ophthalmology exams for early detection of glaucoma and other disorders of the eye. Is the patient up to date with their annual eye exam?  Yes  Who is the provider or what is the name of the office in which the patient attends annual eye exams? [facility] If pt is not established with a provider, would they like to be referred to a provider to establish care? No .   Dental Screening: Recommended annual dental exams for proper oral hygiene  Community Resource Referral / Chronic Care Management: CRR required this visit?  No   CCM required this visit?  No     Plan:     I have personally reviewed and noted the following in the patient's chart:   Medical and social history Use of alcohol, tobacco or illicit drugs  Current medications and supplements including opioid prescriptions. Patient is not currently taking opioid prescriptions. Functional ability and  status Nutritional status Physical activity Advanced directives List of other physicians Hospitalizations, surgeries, and ER visits in previous 12 months Vitals Screenings to include cognitive, depression, and falls Referrals and appointments  In addition, I have reviewed and discussed with patient certain preventive protocols, quality metrics, and best practice recommendations. A written personalized care plan for preventive services as well as general preventive health recommendations were provided to patient.     Sydell Axon, LPN   28/41/3244   After Visit Summary: (MyChart) Due to this being a telephonic visit, the after visit summary with patients personalized plan was offered to patient via MyChart   Nurse Notes: None

## 2023-01-10 ENCOUNTER — Ambulatory Visit: Payer: Medicare PPO | Admitting: Nurse Practitioner

## 2023-01-10 ENCOUNTER — Telehealth: Payer: Medicare PPO | Admitting: Family Medicine

## 2023-01-10 ENCOUNTER — Encounter: Payer: Self-pay | Admitting: Nurse Practitioner

## 2023-01-10 VITALS — BP 140/78 | HR 76 | Temp 99.0°F | Ht 64.0 in | Wt 151.0 lb

## 2023-01-10 DIAGNOSIS — F439 Reaction to severe stress, unspecified: Secondary | ICD-10-CM | POA: Diagnosis not present

## 2023-01-10 DIAGNOSIS — R3989 Other symptoms and signs involving the genitourinary system: Secondary | ICD-10-CM

## 2023-01-10 DIAGNOSIS — N3001 Acute cystitis with hematuria: Secondary | ICD-10-CM | POA: Insufficient documentation

## 2023-01-10 DIAGNOSIS — R300 Dysuria: Secondary | ICD-10-CM | POA: Diagnosis not present

## 2023-01-10 LAB — POC URINALSYSI DIPSTICK (AUTOMATED)
Bilirubin, UA: NEGATIVE
Glucose, UA: NEGATIVE
Ketones, UA: NEGATIVE
Nitrite, UA: NEGATIVE
Protein, UA: POSITIVE — AB
Spec Grav, UA: 1.025 (ref 1.010–1.025)
Urobilinogen, UA: 0.2 U/dL
pH, UA: 7.5 (ref 5.0–8.0)

## 2023-01-10 LAB — URINALYSIS, ROUTINE W REFLEX MICROSCOPIC
Bilirubin Urine: NEGATIVE
Ketones, ur: NEGATIVE
Nitrite: NEGATIVE
Specific Gravity, Urine: 1.02 (ref 1.000–1.030)
Total Protein, Urine: 100 — AB
Urine Glucose: NEGATIVE
Urobilinogen, UA: 0.2 (ref 0.0–1.0)
pH: 7.5 (ref 5.0–8.0)

## 2023-01-10 MED ORDER — CEFDINIR 300 MG PO CAPS: 300.0000 mg | ORAL_CAPSULE | Freq: Two times a day (BID) | ORAL | 0 refills | Status: DC

## 2023-01-10 MED ORDER — SERTRALINE HCL 50 MG PO TABS: 75.0000 mg | ORAL_TABLET | Freq: Every day | ORAL | 1 refills | Status: DC

## 2023-01-10 NOTE — Assessment & Plan Note (Signed)
They report increased stressors and a possible need for increased medication, currently on sertraline 50mg . We will increase sertraline to 75mg  daily. Counseled patient on common side effects. Encouraged to contact if worsening symptoms, unusual behavior changes or suicidal thoughts occur. She will follow up with her PCP.

## 2023-01-10 NOTE — Assessment & Plan Note (Signed)
They present with symptoms of urgency, frequency, dysuria, and hematuria, alongside a low-grade fever. Urinalysis shows positive results for leukocytes and moderate blood, with a history of a previous UTI successfully treated with cefdinir. We will start cefdinir 300 mg twice daily and send urine for culture and sensitivity. We will contact them with culture results and adjust antibiotic therapy if necessary. Advised adequate hydration. Return precautions given to patient.

## 2023-01-10 NOTE — Progress Notes (Signed)
Because you have a low grade fever with UTi symptoms, your condition warrants further evaluation and I recommend that you be seen in a face to face visit with your PCP or at a local urgent care for a sample collection.   NOTE: There will be NO CHARGE for this eVisit   If you are having a true medical emergency please call 911.

## 2023-01-10 NOTE — Progress Notes (Signed)
Theresa Dicker, NP-C Phone: [phone]  Theresa Francis is a 72 y.o. female who presents today for UTI symptoms.   Discussed the use of AI scribe software for clinical note transcription with the patient, who gave verbal consent to proceed.  History of Present Illness   The patient, presents with symptoms suggestive of a UTI. They report feeling unwell since the previous Wednesday, with symptoms intensifying the day before the consultation. The patient describes urinary urgency and frequency, with a sensation of burning during urination. They also noticed blood in their urine for the first time on the day of the consultation. Accompanying these symptoms, the patient experienced feverish chills the night before, although they did not measure their temperature. They deny any abdominal pain or abnormal discharge.  The patient recalls having a severe UTI in the past, which was managed by Dr. Lorin Francis. They attempted to initiate an E-visit for the current symptoms, hoping to simply provide a urine sample for testing. The patient also mentions a history of anxiety, which they feel may have increased recently due to various stressors. They have been on Sertraline for a while, which they believe helps manage their anxiety. They express interest in possibly increasing the dosage of this medication.  The patient also mentions a recent trip to [place], during which they spent long periods sitting in the car. They speculate that this may have contributed to their current UTI symptoms. They have been under the care of [facility] for a colonoscopy, which they found somewhat stressful, but report that this issue is now resolved.      Social History   Tobacco Use  Smoking Status Never  Smokeless Tobacco Never    Current Outpatient Medications on File Prior to Visit  Medication Sig Dispense Refill   acetaminophen (TYLENOL) 650 MG CR tablet Take 1,300 mg by mouth every 6 (six) hours as needed for pain.      Calcium Carbonate-Vitamin D (CALTRATE 600+D PO) Take by mouth daily.     omeprazole (PRILOSEC) 20 MG capsule Take 20 mg by mouth every morning.     ondansetron (ZOFRAN) 4 MG tablet TAKE ONE TABLET BY MOUTH TWICE DAILY AS NEEDED FOR NAUSEA OR VOMITING 15 tablet 0   rosuvastatin (CRESTOR) 10 MG tablet Take 1 tablet (10 mg total) by mouth daily. 90 tablet 3   telmisartan (MICARDIS) 80 MG tablet Take 1 tablet (80 mg total) by mouth daily. 90 tablet 3   vitamin B-12 (CYANOCOBALAMIN) 100 MCG tablet Take 100 mcg by mouth daily.     No current facility-administered medications on file prior to visit.    ROS see history of present illness  Objective  Physical Exam Vitals:   01/10/23 0917  BP: (!) 140/78  Pulse: 76  Temp: 99 F (37.2 C)  SpO2: 97%    BP Readings from Last 3 Encounters:  01/10/23 (!) 140/78  10/28/22 122/72  06/21/22 126/70   Wt Readings from Last 3 Encounters:  01/10/23 151 lb (68.5 kg)  01/03/23 145 lb (65.8 kg)  10/28/22 147 lb (66.7 kg)    Physical Exam Constitutional:      General: She is not in acute distress.    Appearance: Normal appearance.  HENT:     Head: Normocephalic.  Cardiovascular:     Rate and Rhythm: Normal rate and regular rhythm.     Heart sounds: Normal heart sounds.  Pulmonary:     Effort: Pulmonary effort is normal.     Breath sounds: Normal breath sounds.  Abdominal:     General: Abdomen is flat. Bowel sounds are normal.     Palpations: Abdomen is soft.     Tenderness: There is no abdominal tenderness. There is no right CVA tenderness or left CVA tenderness.  Skin:    General: Skin is warm and dry.  Neurological:     General: No focal deficit present.     Mental Status: She is alert.  Psychiatric:        Mood and Affect: Mood normal.        Behavior: Behavior normal.    Assessment/Plan: Please see individual problem list.  Acute cystitis with hematuria Assessment & Plan: They present with symptoms of urgency, frequency,  dysuria, and hematuria, alongside a low-grade fever. Urinalysis shows positive results for leukocytes and moderate blood, with a history of a previous UTI successfully treated with cefdinir. We will start cefdinir 300 mg twice daily and send urine for culture and sensitivity. We will contact them with culture results and adjust antibiotic therapy if necessary. Advised adequate hydration. Return precautions given to patient.   Orders: -     Cefdinir; Take 1 capsule (300 mg total) by mouth 2 (two) times daily.  Dispense: 10 capsule; Refill: 0  Stress Assessment & Plan: They report increased stressors and a possible need for increased medication, currently on sertraline 50mg . We will increase sertraline to 75mg  daily. Counseled patient on common side effects. Encouraged to contact if worsening symptoms, unusual behavior changes or suicidal thoughts occur. She will follow up with her PCP.   Orders: -     Sertraline HCl; Take 1.5 tablets (75 mg total) by mouth daily.  Dispense: 135 tablet; Refill: 1  Dysuria -     POCT Urinalysis Dipstick (Automated) -     Urinalysis, Routine w reflex microscopic -     Urine Culture   Return if symptoms worsen or fail to improve.   Theresa Dicker, NP-C [facility] - [employer]

## 2023-01-12 LAB — URINE CULTURE
MICRO NUMBER:: 15750590
SPECIMEN QUALITY:: ADEQUATE

## 2023-02-24 ENCOUNTER — Other Ambulatory Visit (INDEPENDENT_AMBULATORY_CARE_PROVIDER_SITE_OTHER): Payer: Medicare PPO

## 2023-02-24 DIAGNOSIS — E7800 Pure hypercholesterolemia, unspecified: Secondary | ICD-10-CM

## 2023-02-24 DIAGNOSIS — I10 Essential (primary) hypertension: Secondary | ICD-10-CM | POA: Diagnosis not present

## 2023-02-24 DIAGNOSIS — R739 Hyperglycemia, unspecified: Secondary | ICD-10-CM

## 2023-02-24 LAB — LIPID PANEL
Cholesterol: 160 mg/dL (ref 0–200)
HDL: 67.4 mg/dL (ref >39.00)
LDL Cholesterol: 80 mg/dL (ref 0–99)
NonHDL: 92.49
Total CHOL/HDL Ratio: 2
Triglycerides: 60 mg/dL (ref 0.0–149.0)
VLDL: 12 mg/dL (ref 0.0–40.0)

## 2023-02-24 LAB — BASIC METABOLIC PANEL
BUN: 17 mg/dL (ref 6–23)
CO2: 26 meq/L (ref 19–32)
Calcium: 9.1 mg/dL (ref 8.4–10.5)
Chloride: 105 meq/L (ref 96–112)
Creatinine, Ser: 0.71 mg/dL (ref 0.40–1.20)
GFR: 84.41 mL/min (ref >60.00)
Glucose, Bld: 102 mg/dL — ABNORMAL HIGH (ref 70–99)
Potassium: 4 meq/L (ref 3.5–5.1)
Sodium: 140 meq/L (ref 135–145)

## 2023-02-24 LAB — HEMOGLOBIN A1C: Hgb A1c MFr Bld: 6.1 % (ref 4.6–6.5)

## 2023-02-24 LAB — HEPATIC FUNCTION PANEL
ALT: 15 U/L (ref 0–35)
AST: 19 U/L (ref 0–37)
Albumin: 4.5 g/dL (ref 3.5–5.2)
Alkaline Phosphatase: 76 U/L (ref 39–117)
Bilirubin, Direct: 0.2 mg/dL (ref 0.0–0.3)
Total Bilirubin: 1 mg/dL (ref 0.2–1.2)
Total Protein: 6.9 g/dL (ref 6.0–8.3)

## 2023-02-24 LAB — CBC WITH DIFFERENTIAL/PLATELET
Basophils Absolute: 0 10*3/uL (ref 0.0–0.1)
Basophils Relative: 0.4 % (ref 0.0–3.0)
Eosinophils Absolute: 0.1 10*3/uL (ref 0.0–0.7)
Eosinophils Relative: 1.7 % (ref 0.0–5.0)
HCT: 39.6 % (ref 36.0–46.0)
Hemoglobin: 13 g/dL (ref 12.0–15.0)
Lymphocytes Relative: 36.9 % (ref 12.0–46.0)
Lymphs Abs: 2.4 10*3/uL (ref 0.7–4.0)
MCHC: 32.7 g/dL (ref 30.0–36.0)
MCV: 87.8 fL (ref 78.0–100.0)
Monocytes Absolute: 0.6 10*3/uL (ref 0.1–1.0)
Monocytes Relative: 9.1 % (ref 3.0–12.0)
Neutro Abs: 3.4 10*3/uL (ref 1.4–7.7)
Neutrophils Relative %: 51.9 % (ref 43.0–77.0)
Platelets: 285 10*3/uL (ref 150.0–400.0)
RBC: 4.51 Mil/uL (ref 3.87–5.11)
RDW: 13.9 % (ref 11.5–15.5)
WBC: 6.6 10*3/uL (ref 4.0–10.5)

## 2023-02-24 LAB — TSH: TSH: 2.26 u[IU]/mL (ref 0.35–5.50)

## 2023-02-28 ENCOUNTER — Ambulatory Visit: Payer: Medicare PPO | Admitting: Internal Medicine

## 2023-02-28 ENCOUNTER — Encounter: Payer: Self-pay | Admitting: Internal Medicine

## 2023-02-28 VITALS — BP 112/70 | HR 87 | Temp 98.0°F | Resp 16 | Ht 64.0 in | Wt 148.6 lb

## 2023-02-28 DIAGNOSIS — K921 Melena: Secondary | ICD-10-CM

## 2023-02-28 DIAGNOSIS — E7800 Pure hypercholesterolemia, unspecified: Secondary | ICD-10-CM

## 2023-02-28 DIAGNOSIS — R0981 Nasal congestion: Secondary | ICD-10-CM | POA: Diagnosis not present

## 2023-02-28 DIAGNOSIS — I10 Essential (primary) hypertension: Secondary | ICD-10-CM | POA: Diagnosis not present

## 2023-02-28 DIAGNOSIS — K625 Hemorrhage of anus and rectum: Secondary | ICD-10-CM

## 2023-02-28 DIAGNOSIS — R739 Hyperglycemia, unspecified: Secondary | ICD-10-CM

## 2023-02-28 DIAGNOSIS — F439 Reaction to severe stress, unspecified: Secondary | ICD-10-CM

## 2023-02-28 MED ORDER — SERTRALINE HCL 50 MG PO TABS: 75.0000 mg | ORAL_TABLET | Freq: Every day | ORAL | 1 refills | Status: DC

## 2023-02-28 NOTE — Assessment & Plan Note (Signed)
Blood pressure as outlined. Doing well. On micardis 80mg  q day.  Follow pressures.  Follow metabolic panel.

## 2023-02-28 NOTE — Assessment & Plan Note (Signed)
 Dr Micheal Likens - 11/2022 - plan f/u colonoscopy 2026 (last 2023).  Sitz baths.  Anal/rectal manometry.

## 2023-02-28 NOTE — Assessment & Plan Note (Signed)
 Appears to be handling stress relatively well.  Continues on Zoloft.  Now on 75mg  q day. No changes.  Follow.

## 2023-02-28 NOTE — Assessment & Plan Note (Signed)
 Nasal congestion as outlined. Will treat with saline nasal spray and steroid nasal spray as directed.  Call with update.

## 2023-02-28 NOTE — Assessment & Plan Note (Signed)
 Low-carb diet and exercise.  Follow Met b and A1c.  Lab Results  Component Value Date   HGBA1C 6.1 02/24/2023

## 2023-02-28 NOTE — Assessment & Plan Note (Signed)
 Evaluated by GI as outlined. Felt to be hemorrhoids. Plans as outlined.

## 2023-02-28 NOTE — Patient Instructions (Signed)
 Saline nasal spray - flush nose 1-2x/day  Nasacort nasal spray - 2 sprays each nostril one time per day.

## 2023-02-28 NOTE — Progress Notes (Signed)
 Subjective:    Patient ID: Theresa Francis, female    DOB: August 28, 1950, 73 y.o.   MRN: [id number]  Patient here for  Chief Complaint  Patient presents with   Medical Management of Chronic Issues    HPI Here for a scheduled follow up - follow up regarding hypercholesterolemia, increased stress and hypertension.  On zoloft . Recently increased dose to 75mg  q day. Feels handling things relatively well. Elects to remain on this dose. Last visit, she had reported some bowel change with some occasional notice of blood when wiping. Saw GI.  Recommended starting psyllium supplementation, sitz bath and topical medications (anusol or prep H) prn. Plan for repeat colonoscpy 2026. Concern regarding possible weak external anal sphincter. Recommended anal rectal manometry. Scheduled for 03/20/23.  Stays active. States she has not been exercising as much recently, but does stay active. Reports that starting 02/25/23 - noticed nasal crusting and minimal posterior headache. Tylenol relieves. No significant headache. Some increased sinus pressure and nasal congestion. Some drainage. No chest congestion, cough or sob. No chest pain. No vomiting or diarrhea.    Past Medical History:  Diagnosis Date   Abnormal Pap smear    s/p conization.    Basal cell carcinoma    eye lid   History of chicken pox    History of shingles    Osteoarthritis    Urine incontinence    H/O occasional   Past Surgical History:  Procedure Laterality Date   BREAST BIOPSY Left 11/03/2014   neg./ done by Dr. 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   FINE NEEDLE ASPIRATION Left 2010   Dr Dessa   TONSILLECTOMY   1967   Family History  Problem Relation Age of Onset   Breast cancer Mother 62   Heart disease Father    Breast cancer Sister 70   Cancer Sister 67       angiosarcoma   Uterine cancer Maternal Aunt 9 - 54   Social History   Socioeconomic History   Marital status: Married    Spouse name: Not on file   Number of children: 3   Years of education: Not on file   Highest education level: Master's degree (e.g., MA, MS, MEng, MEd, MSW, MBA)  Occupational History   Not on file  Tobacco Use   Smoking status: Never   Smokeless tobacco: Never  Vaping Use   Vaping status: Never Used  Substance and Sexual Activity   Alcohol use: Yes    Alcohol/week: 0.0 standard drinks of alcohol    Comment: drinks wine occasionally   Drug use: No   Sexual activity: Not on file  Other Topics Concern   Not on file  Social History Narrative   Married   Social Drivers of Health   Financial Resource Strain: Low Risk  (02/27/2023)   Overall Financial Resource Strain (CARDIA)    Difficulty of Paying Living Expenses: Not hard at all  Food Insecurity: No Food Insecurity (02/27/2023)   Hunger Vital Sign    Worried About Running Out of Food in the Last Year: Never true    Ran Out  of Food in the Last Year: Never true  Transportation Needs: No Transportation Needs (02/27/2023)   PRAPARE - Administrator, Civil Service (Medical): No    Lack of Transportation (Non-Medical): No  Physical Activity: Insufficiently Active (02/27/2023)   Exercise Vital Sign    Days of Exercise per Week: 3 days    Minutes of Exercise per Session: 40 min  Stress: No Stress Concern Present (02/27/2023)   [employer] of Occupational Health - Occupational Stress Questionnaire    Feeling of Stress : Only a little  Social Connections: Socially Integrated (02/27/2023)   Social Connection and Isolation Panel [NHANES]    Frequency of Communication with Friends and Family: Twice a week    Frequency of Social Gatherings with  Friends and Family: Three times a week    Attends Religious Services: More than 4 times per year    Active Member of Clubs or Organizations: Yes    Attends Banker Meetings: More than 4 times per year    Marital Status: Married     Review of Systems  Constitutional:  Negative for appetite change, fever and unexpected weight change.  HENT:  Positive for congestion, postnasal drip and sinus pressure.   Respiratory:  Negative for chest tightness and shortness of breath.        No significant cough.   Cardiovascular:  Negative for chest pain, palpitations and leg swelling.  Gastrointestinal:  Negative for abdominal pain, diarrhea, nausea and vomiting.  Genitourinary:  Negative for difficulty urinating and dysuria.  Musculoskeletal:  Negative for joint swelling and myalgias.  Skin:  Negative for color change and rash.  Neurological:  Negative for dizziness.       No significant headache.   Psychiatric/Behavioral:  Negative for agitation and dysphoric mood.        Increased stress.        Objective:     BP 112/70   Pulse 87   Temp 98 F (36.7 C)   Resp 16   Ht 5' 4 (1.626 m)   Wt 148 lb 9.6 oz (67.4 kg)   SpO2 98%   BMI 25.51 kg/m  Wt Readings from Last 3 Encounters:  02/28/23 148 lb 9.6 oz (67.4 kg)  01/10/23 151 lb (68.5 kg)  01/03/23 145 lb (65.8 kg)    Physical Exam Vitals reviewed.  Constitutional:      General: She is not in acute distress.    Appearance: Normal appearance.  HENT:     Head: Normocephalic and atraumatic.     Right Ear: Tympanic membrane, ear canal and external ear normal.     Left Ear: Tympanic membrane, ear canal and external ear normal.  Eyes:     General: No scleral icterus.       Right eye: No discharge.        Left eye: No discharge.     Conjunctiva/sclera: Conjunctivae normal.  Neck:     Thyroid : No thyromegaly.  Cardiovascular:     Rate and Rhythm: Normal rate and regular rhythm.  Pulmonary:     Effort: No respiratory  distress.     Breath sounds: Normal breath sounds. No wheezing.  Abdominal:     General: Bowel sounds are normal.     Palpations: Abdomen is soft.     Tenderness: There is no abdominal tenderness.  Musculoskeletal:        General: No swelling or tenderness.     Cervical back: Neck supple. No tenderness.  Lymphadenopathy:  Cervical: No cervical adenopathy.  Skin:    Findings: No erythema or rash.  Neurological:     Mental Status: She is alert.  Psychiatric:        Mood and Affect: Mood normal.        Behavior: Behavior normal.      Outpatient Encounter Medications as of 02/28/2023  Medication Sig   acetaminophen (TYLENOL) 650 MG CR tablet Take 1,300 mg by mouth every 6 (six) hours as needed for pain.   Calcium  Carbonate-Vitamin D  (CALTRATE 600+D PO) Take by mouth daily.   omeprazole  (PRILOSEC) 20 MG capsule Take 20 mg by mouth every morning.   ondansetron  (ZOFRAN ) 4 MG tablet TAKE ONE TABLET BY MOUTH TWICE DAILY AS NEEDED FOR NAUSEA OR VOMITING   rosuvastatin  (CRESTOR ) 10 MG tablet Take 1 tablet (10 mg total) by mouth daily.   sertraline  (ZOLOFT ) 50 MG tablet Take 1.5 tablets (75 mg total) by mouth daily.   telmisartan  (MICARDIS ) 80 MG tablet Take 1 tablet (80 mg total) by mouth daily.   vitamin B-12 (CYANOCOBALAMIN) 100 MCG tablet Take 100 mcg by mouth daily.   [DISCONTINUED] cefdinir  (OMNICEF ) 300 MG capsule Take 1 capsule (300 mg total) by mouth 2 (two) times daily.   [DISCONTINUED] sertraline  (ZOLOFT ) 50 MG tablet Take 1.5 tablets (75 mg total) by mouth daily.   No facility-administered encounter medications on file as of 02/28/2023.     Lab Results  Component Value Date   WBC 6.6 02/24/2023   HGB 13.0 02/24/2023   HCT 39.6 02/24/2023   PLT 285.0 02/24/2023   GLUCOSE 102 (H) 02/24/2023   CHOL 160 02/24/2023   TRIG 60.0 02/24/2023   HDL 67.40 02/24/2023   LDLDIRECT 130.6 11/26/2012   LDLCALC 80 02/24/2023   ALT 15 02/24/2023   AST 19 02/24/2023   NA 140 02/24/2023    K 4.0 02/24/2023   CL 105 02/24/2023   CREATININE 0.71 02/24/2023   BUN 17 02/24/2023   CO2 26 02/24/2023   TSH 2.26 02/24/2023   HGBA1C 6.1 02/24/2023    MM 3D DIAGNOSTIC MAMMOGRAM UNILATERAL RIGHT BREAST Result Date: 12/28/2022 CLINICAL DATA:  RIGHT asymmetry callback EXAM: DIGITAL DIAGNOSTIC UNILATERAL RIGHT MAMMOGRAM WITH TOMOSYNTHESIS AND CAD; ULTRASOUND RIGHT BREAST LIMITED TECHNIQUE: Right digital diagnostic mammography and breast tomosynthesis was performed. The images were evaluated with computer-aided detection. ; Targeted ultrasound examination of the right breast was performed COMPARISON:  Previous exam(s). ACR Breast Density Category c: The breasts are heterogeneously dense, which may obscure small masses. FINDINGS: Spot compression tomosynthesis views confirm a persistent oval obscured mass in the RIGHT upper breast at anterior depth. Targeted ultrasound was performed of the RIGHT upper breast. At 11:30 2 cm from the nipple, there is an oval circumscribed anechoic mass with posterior acoustic enhancement. It measures 17 x 8 x 19 mm and is consistent with a benign simple cyst. This corresponds to the site of screening mammographic concern. IMPRESSION: There is a benign cyst at the site of screening mammographic concern. RECOMMENDATION: Screening mammogram in one year.(Code:SM-B-01Y) I have discussed the findings and recommendations with the patient. If applicable, a reminder letter will be sent to the patient regarding the next appointment. BI-RADS CATEGORY  2: Benign. Electronically Signed   By: Corean Salter M.D.   On: 12/28/2022 14:47   US  LIMITED ULTRASOUND INCLUDING AXILLA RIGHT BREAST Result Date: 12/28/2022 CLINICAL DATA:  RIGHT asymmetry callback EXAM: DIGITAL DIAGNOSTIC UNILATERAL RIGHT MAMMOGRAM WITH TOMOSYNTHESIS AND CAD; ULTRASOUND RIGHT BREAST LIMITED TECHNIQUE: Right digital  diagnostic mammography and breast tomosynthesis was performed. The images were evaluated with  computer-aided detection. ; Targeted ultrasound examination of the right breast was performed COMPARISON:  Previous exam(s). ACR Breast Density Category c: The breasts are heterogeneously dense, which may obscure small masses. FINDINGS: Spot compression tomosynthesis views confirm a persistent oval obscured mass in the RIGHT upper breast at anterior depth. Targeted ultrasound was performed of the RIGHT upper breast. At 11:30 2 cm from the nipple, there is an oval circumscribed anechoic mass with posterior acoustic enhancement. It measures 17 x 8 x 19 mm and is consistent with a benign simple cyst. This corresponds to the site of screening mammographic concern. IMPRESSION: There is a benign cyst at the site of screening mammographic concern. RECOMMENDATION: Screening mammogram in one year.(Code:SM-B-01Y) I have discussed the findings and recommendations with the patient. If applicable, a reminder letter will be sent to the patient regarding the next appointment. BI-RADS CATEGORY  2: Benign. Electronically Signed   By: Corean Salter M.D.   On: 12/28/2022 14:47       Assessment & Plan:  Hypercholesterolemia Assessment & Plan: Continue crestor .  Low cholesterol diet and exercise.  Follow lipid panel and liver function tests.    Orders: -     Lipid panel; Future -     Hepatic function panel; Future -     Basic metabolic panel; Future  Hyperglycemia Assessment & Plan: Low-carb diet and exercise.  Follow Met b and A1c.  Lab Results  Component Value Date   HGBA1C 6.1 02/24/2023    Orders: -     Hemoglobin A1c; Future  Stress Assessment & Plan: Appears to be handling stress relatively well.  Continues on Zoloft .  Now on 75mg  q day. No changes.  Follow.   Orders: -     Sertraline  HCl; Take 1.5 tablets (75 mg total) by mouth daily.  Dispense: 135 tablet; Refill: 1  Rectal bleeding Assessment & Plan: Evaluated by GI as outlined. Felt to be hemorrhoids. Plans as outlined.     Hypertension, unspecified type Assessment & Plan: Blood pressure as outlined. Doing well. On micardis  80mg  q day.  Follow pressures.  Follow metabolic panel.    Hematochezia Assessment & Plan: Dr Celestina - 11/2022 - plan f/u colonoscopy 2026 (last 2023).  Sitz baths.  Anal/rectal manometry.    Nasal congestion Assessment & Plan: Nasal congestion as outlined. Will treat with saline nasal spray and steroid nasal spray as directed.  Call with update.       Allena Hamilton, MD

## 2023-02-28 NOTE — Assessment & Plan Note (Signed)
 Continue crestor.  Low cholesterol diet and exercise.  Follow lipid panel and liver function tests.

## 2023-03-01 DIAGNOSIS — Z0100 Encounter for examination of eyes and vision without abnormal findings: Secondary | ICD-10-CM | POA: Diagnosis not present

## 2023-03-01 DIAGNOSIS — H02831 Dermatochalasis of right upper eyelid: Secondary | ICD-10-CM | POA: Diagnosis not present

## 2023-03-01 DIAGNOSIS — H2513 Age-related nuclear cataract, bilateral: Secondary | ICD-10-CM | POA: Diagnosis not present

## 2023-03-20 DIAGNOSIS — R150 Incomplete defecation: Secondary | ICD-10-CM | POA: Diagnosis not present

## 2023-03-20 DIAGNOSIS — K6289 Other specified diseases of anus and rectum: Secondary | ICD-10-CM | POA: Diagnosis not present

## 2023-03-20 DIAGNOSIS — R159 Full incontinence of feces: Secondary | ICD-10-CM | POA: Diagnosis not present

## 2023-04-04 DIAGNOSIS — M6281 Muscle weakness (generalized): Secondary | ICD-10-CM | POA: Diagnosis not present

## 2023-04-04 DIAGNOSIS — R159 Full incontinence of feces: Secondary | ICD-10-CM | POA: Diagnosis not present

## 2023-04-04 DIAGNOSIS — R278 Other lack of coordination: Secondary | ICD-10-CM | POA: Diagnosis not present

## 2023-04-19 DIAGNOSIS — R278 Other lack of coordination: Secondary | ICD-10-CM | POA: Diagnosis not present

## 2023-04-19 DIAGNOSIS — R159 Full incontinence of feces: Secondary | ICD-10-CM | POA: Diagnosis not present

## 2023-04-19 DIAGNOSIS — M6281 Muscle weakness (generalized): Secondary | ICD-10-CM | POA: Diagnosis not present

## 2023-04-20 DIAGNOSIS — H02831 Dermatochalasis of right upper eyelid: Secondary | ICD-10-CM | POA: Diagnosis not present

## 2023-04-27 DIAGNOSIS — Z08 Encounter for follow-up examination after completed treatment for malignant neoplasm: Secondary | ICD-10-CM | POA: Diagnosis not present

## 2023-04-27 DIAGNOSIS — D2272 Melanocytic nevi of left lower limb, including hip: Secondary | ICD-10-CM | POA: Diagnosis not present

## 2023-04-27 DIAGNOSIS — L821 Other seborrheic keratosis: Secondary | ICD-10-CM | POA: Diagnosis not present

## 2023-04-27 DIAGNOSIS — D225 Melanocytic nevi of trunk: Secondary | ICD-10-CM | POA: Diagnosis not present

## 2023-04-27 DIAGNOSIS — D2262 Melanocytic nevi of left upper limb, including shoulder: Secondary | ICD-10-CM | POA: Diagnosis not present

## 2023-04-27 DIAGNOSIS — D2271 Melanocytic nevi of right lower limb, including hip: Secondary | ICD-10-CM | POA: Diagnosis not present

## 2023-04-27 DIAGNOSIS — D2261 Melanocytic nevi of right upper limb, including shoulder: Secondary | ICD-10-CM | POA: Diagnosis not present

## 2023-04-27 DIAGNOSIS — Z85828 Personal history of other malignant neoplasm of skin: Secondary | ICD-10-CM | POA: Diagnosis not present

## 2023-05-02 DIAGNOSIS — M6281 Muscle weakness (generalized): Secondary | ICD-10-CM | POA: Diagnosis not present

## 2023-05-02 DIAGNOSIS — R159 Full incontinence of feces: Secondary | ICD-10-CM | POA: Diagnosis not present

## 2023-05-02 DIAGNOSIS — R278 Other lack of coordination: Secondary | ICD-10-CM | POA: Diagnosis not present

## 2023-05-23 DIAGNOSIS — R278 Other lack of coordination: Secondary | ICD-10-CM | POA: Diagnosis not present

## 2023-05-23 DIAGNOSIS — R159 Full incontinence of feces: Secondary | ICD-10-CM | POA: Diagnosis not present

## 2023-05-23 DIAGNOSIS — M6281 Muscle weakness (generalized): Secondary | ICD-10-CM | POA: Diagnosis not present

## 2023-06-01 DIAGNOSIS — R278 Other lack of coordination: Secondary | ICD-10-CM | POA: Diagnosis not present

## 2023-06-01 DIAGNOSIS — R159 Full incontinence of feces: Secondary | ICD-10-CM | POA: Diagnosis not present

## 2023-06-01 DIAGNOSIS — M6281 Muscle weakness (generalized): Secondary | ICD-10-CM | POA: Diagnosis not present

## 2023-06-27 ENCOUNTER — Other Ambulatory Visit (INDEPENDENT_AMBULATORY_CARE_PROVIDER_SITE_OTHER): Payer: Medicare PPO

## 2023-06-27 DIAGNOSIS — E7800 Pure hypercholesterolemia, unspecified: Secondary | ICD-10-CM

## 2023-06-27 DIAGNOSIS — R739 Hyperglycemia, unspecified: Secondary | ICD-10-CM

## 2023-06-27 LAB — BASIC METABOLIC PANEL WITH GFR
BUN: 16 mg/dL (ref 6–23)
CO2: 28 meq/L (ref 19–32)
Calcium: 9.4 mg/dL (ref 8.4–10.5)
Chloride: 105 meq/L (ref 96–112)
Creatinine, Ser: 0.76 mg/dL (ref 0.40–1.20)
GFR: 77.61 mL/min (ref >60.00)
Glucose, Bld: 107 mg/dL — ABNORMAL HIGH (ref 70–99)
Potassium: 4.2 meq/L (ref 3.5–5.1)
Sodium: 140 meq/L (ref 135–145)

## 2023-06-27 LAB — LIPID PANEL
Cholesterol: 170 mg/dL (ref 0–200)
HDL: 63 mg/dL (ref >39.00)
LDL Cholesterol: 97 mg/dL (ref 0–99)
NonHDL: 107.31
Total CHOL/HDL Ratio: 3
Triglycerides: 54 mg/dL (ref 0.0–149.0)
VLDL: 10.8 mg/dL (ref 0.0–40.0)

## 2023-06-27 LAB — HEPATIC FUNCTION PANEL
ALT: 15 U/L (ref 0–35)
AST: 20 U/L (ref 0–37)
Albumin: 4.6 g/dL (ref 3.5–5.2)
Alkaline Phosphatase: 64 U/L (ref 39–117)
Bilirubin, Direct: 0.2 mg/dL (ref 0.0–0.3)
Total Bilirubin: 0.7 mg/dL (ref 0.2–1.2)
Total Protein: 7.1 g/dL (ref 6.0–8.3)

## 2023-06-27 LAB — HEMOGLOBIN A1C: Hgb A1c MFr Bld: 5.9 % (ref 4.6–6.5)

## 2023-06-29 ENCOUNTER — Ambulatory Visit (INDEPENDENT_AMBULATORY_CARE_PROVIDER_SITE_OTHER): Payer: Medicare PPO | Admitting: Internal Medicine

## 2023-06-29 ENCOUNTER — Encounter: Payer: Self-pay | Admitting: Internal Medicine

## 2023-06-29 VITALS — BP 120/70 | HR 85 | Temp 98.0°F | Resp 16 | Ht 64.0 in | Wt 149.0 lb

## 2023-06-29 DIAGNOSIS — E7800 Pure hypercholesterolemia, unspecified: Secondary | ICD-10-CM

## 2023-06-29 DIAGNOSIS — E538 Deficiency of other specified B group vitamins: Secondary | ICD-10-CM

## 2023-06-29 DIAGNOSIS — F439 Reaction to severe stress, unspecified: Secondary | ICD-10-CM | POA: Diagnosis not present

## 2023-06-29 DIAGNOSIS — I10 Essential (primary) hypertension: Secondary | ICD-10-CM | POA: Diagnosis not present

## 2023-06-29 DIAGNOSIS — R739 Hyperglycemia, unspecified: Secondary | ICD-10-CM

## 2023-06-29 DIAGNOSIS — K824 Cholesterolosis of gallbladder: Secondary | ICD-10-CM

## 2023-06-29 DIAGNOSIS — Z0000 Encounter for general adult medical examination without abnormal findings: Secondary | ICD-10-CM

## 2023-06-29 NOTE — Assessment & Plan Note (Signed)
 Check B12 level with next labs.

## 2023-06-29 NOTE — Progress Notes (Addendum)
 Subjective:    Patient ID: Theresa Francis, female    DOB: October 26, 1950, 73 y.o.   MRN: [id number]  Patient here for  Chief Complaint  Patient presents with   Annual Exam    HPI Here for a physical exam. Continues on zoloft .  A previous visit, she had reported some bowel change with some occasional notice of blood when wiping. Saw GI. Recommended starting psyllium supplementation, sitz bath and topical medications (anusol or prep H) prn. Plan for repeat colonoscpy 2026. Concern regarding possible weak external anal sphincter.  Recommended anorectal manometry. 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 Has been doing therapy. Doing exercises at home. Symptoms have improved. Leakage - improved. No chest pain or sob reported. No abdominal pain. No cough or congestion.    Past Medical History:  Diagnosis Date   Abnormal Pap smear    s/p conization.    Basal cell carcinoma    eye lid   History of chicken pox    History of shingles    Osteoarthritis    Urine incontinence    H/O occasional   Past Surgical History:  Procedure Laterality Date   BREAST BIOPSY Left 11/03/2014   neg./ done by Dr. Marquita Situ in office   BREAST CYST ASPIRATION Left    COLONOSCOPY  2008   Dr Beth Brooke   COLONOSCOPY WITH PROPOFOL  N/A 11/27/2019   Procedure: COLONOSCOPY WITH PROPOFOL ;  Surgeon: Irby Mannan, MD;  Location: [facility] ENDOSCOPY;  Service: Endoscopy;  Laterality: N/A;  PATIENT COVID + on 9/03/202;   COPY ON CHART   EYE SURGERY Left 1957, 2002   x2 for Strbismis   FINE NEEDLE ASPIRATION Left 2010   Dr Marquita Situ   TONSILLECTOMY  1967   Family History  Problem Relation Age of Onset   Breast cancer Mother 64   Heart disease Father    Breast cancer Sister 58   Cancer Sister 51       angiosarcoma   Uterine cancer Maternal Aunt 51 - 61   Social History   Socioeconomic History   Marital status:  Married    Spouse name: Not on file   Number of children: 3   Years of education: Not on file   Highest education level: Master's degree (e.g., MA, MS, MEng, MEd, MSW, MBA)  Occupational History   Not on file  Tobacco Use   Smoking status: Never   Smokeless tobacco: Never  Vaping Use   Vaping status: Never Used  Substance and Sexual Activity   Alcohol use: Yes    Alcohol/week: 0.0 standard drinks of alcohol    Comment: drinks wine occasionally   Drug use: No   Sexual activity: Not on file  Other Topics Concern   Not on file  Social History Narrative   Married   Social Drivers of Health   Financial Resource Strain: Low Risk  (02/27/2023)   Overall Financial Resource Strain (CARDIA)    Difficulty of Paying Living Expenses: Not hard at all  Food Insecurity: No Food Insecurity (02/27/2023)   Hunger Vital Sign    Worried About Running Out of Food in the Last Year: Never true    Ran Out of Food in the Last Year: Never true  Transportation Needs: No Transportation Needs (02/27/2023)   PRAPARE - Administrator, Civil Service (Medical): No    Lack of Transportation (Non-Medical): No  Physical Activity: Insufficiently  Active (02/27/2023)   Exercise Vital Sign    Days of Exercise per Week: 3 days    Minutes of Exercise per Session: 40 min  Stress: No Stress Concern Present (02/27/2023)   [employer] of Occupational Health - Occupational Stress Questionnaire    Feeling of Stress : Only a little  Social Connections: Socially Integrated (02/27/2023)   Social Connection and Isolation Panel [NHANES]    Frequency of Communication with Friends and Family: Twice a week    Frequency of Social Gatherings with Friends and Family: Three times a week    Attends Religious Services: More than 4 times per year    Active Member of Clubs or Organizations: Yes    Attends Banker Meetings: More than 4 times per year    Marital Status: Married     Review of Systems   Constitutional:  Negative for appetite change and unexpected weight change.  HENT:  Negative for congestion and sinus pressure.   Respiratory:  Negative for cough, chest tightness and shortness of breath.   Cardiovascular:  Negative for chest pain, palpitations and leg swelling.  Gastrointestinal:  Negative for abdominal pain, diarrhea, nausea and vomiting.  Genitourinary:  Negative for difficulty urinating and dysuria.  Musculoskeletal:  Negative for joint swelling and myalgias.  Skin:  Negative for color change and rash.  Neurological:  Negative for dizziness and headaches.  Psychiatric/Behavioral:  Negative for agitation and dysphoric mood.        Objective:     BP 120/70   Pulse 85   Temp 98 F (36.7 C)   Resp 16   Ht 5\' 4"  (1.626 m)   Wt 149 lb (67.6 kg)   SpO2 98%   BMI 25.58 kg/m  Wt Readings from Last 3 Encounters:  06/29/23 149 lb (67.6 kg)  02/28/23 148 lb 9.6 oz (67.4 kg)  01/10/23 151 lb (68.5 kg)    Physical Exam Vitals reviewed.  Constitutional:      General: She is not in acute distress.    Appearance: Normal appearance.  HENT:     Head: Normocephalic and atraumatic.     Right Ear: External ear normal.     Left Ear: External ear normal.     Mouth/Throat:     Pharynx: No oropharyngeal exudate or posterior oropharyngeal erythema.  Eyes:     General: No scleral icterus.       Right eye: No discharge.        Left eye: No discharge.     Conjunctiva/sclera: Conjunctivae normal.  Neck:     Thyroid : No thyromegaly.  Cardiovascular:     Rate and Rhythm: Normal rate and regular rhythm.  Pulmonary:     Effort: No respiratory distress.     Breath sounds: Normal breath sounds. No wheezing.  Abdominal:     General: Bowel sounds are normal.     Palpations: Abdomen is soft.     Tenderness: There is no abdominal tenderness.  Musculoskeletal:        General: No swelling or tenderness.     Cervical back: Neck supple. No tenderness.  Lymphadenopathy:      Cervical: No cervical adenopathy.  Skin:    Findings: No erythema or rash.  Neurological:     Mental Status: She is alert.  Psychiatric:        Mood and Affect: Mood normal.        Behavior: Behavior normal.         Outpatient Encounter Medications  as of 06/29/2023  Medication Sig   acetaminophen (TYLENOL) 650 MG CR tablet Take 1,300 mg by mouth every 6 (six) hours as needed for pain.   Calcium  Carbonate-Vitamin D  (CALTRATE 600+D PO) Take by mouth daily.   omeprazole (PRILOSEC) 20 MG capsule Take 20 mg by mouth every morning.   ondansetron  (ZOFRAN ) 4 MG tablet TAKE ONE TABLET BY MOUTH TWICE DAILY AS NEEDED FOR NAUSEA OR VOMITING   rosuvastatin  (CRESTOR ) 10 MG tablet Take 1 tablet (10 mg total) by mouth daily.   sertraline  (ZOLOFT ) 50 MG tablet Take 1.5 tablets (75 mg total) by mouth daily.   telmisartan  (MICARDIS ) 80 MG tablet Take 1 tablet (80 mg total) by mouth daily.   vitamin B-12 (CYANOCOBALAMIN) 100 MCG tablet Take 100 mcg by mouth daily.   No facility-administered encounter medications on file as of 06/29/2023.     Lab Results  Component Value Date   WBC 6.6 02/24/2023   HGB 13.0 02/24/2023   HCT 39.6 02/24/2023   PLT 285.0 02/24/2023   GLUCOSE 107 (H) 06/27/2023   CHOL 170 06/27/2023   TRIG 54.0 06/27/2023   HDL 63.00 06/27/2023   LDLDIRECT 130.6 11/26/2012   LDLCALC 97 06/27/2023   ALT 15 06/27/2023   AST 20 06/27/2023   NA 140 06/27/2023   K 4.2 06/27/2023   CL 105 06/27/2023   CREATININE 0.76 06/27/2023   BUN 16 06/27/2023   CO2 28 06/27/2023   TSH 2.26 02/24/2023   HGBA1C 5.9 06/27/2023    MM 3D DIAGNOSTIC MAMMOGRAM UNILATERAL RIGHT BREAST Result Date: 12/28/2022 CLINICAL DATA:  RIGHT asymmetry callback EXAM: DIGITAL DIAGNOSTIC UNILATERAL RIGHT MAMMOGRAM WITH TOMOSYNTHESIS AND CAD; ULTRASOUND RIGHT BREAST LIMITED TECHNIQUE: Right digital diagnostic mammography and breast tomosynthesis was performed. The images were evaluated with computer-aided detection.  ; Targeted ultrasound examination of the right breast was performed COMPARISON:  Previous exam(s). ACR Breast Density Category c: The breasts are heterogeneously dense, which may obscure small masses. FINDINGS: Spot compression tomosynthesis views confirm a persistent oval obscured mass in the RIGHT upper breast at anterior depth. Targeted ultrasound was performed of the RIGHT upper breast. At 11:30 2 cm from the nipple, there is an oval circumscribed anechoic mass with posterior acoustic enhancement. It measures 17 x 8 x 19 mm and is consistent with a benign simple cyst. This corresponds to the site of screening mammographic concern. IMPRESSION: There is a benign cyst at the site of screening mammographic concern. RECOMMENDATION: Screening mammogram in one year.(Code:SM-B-01Y) I have discussed the findings and recommendations with the patient. If applicable, a reminder letter will be sent to the patient regarding the next appointment. BI-RADS CATEGORY  2: Benign. Electronically Signed   By: Clancy Crimes M.D.   On: 12/28/2022 14:47   US  LIMITED ULTRASOUND INCLUDING AXILLA RIGHT BREAST Result Date: 12/28/2022 CLINICAL DATA:  RIGHT asymmetry callback EXAM: DIGITAL DIAGNOSTIC UNILATERAL RIGHT MAMMOGRAM WITH TOMOSYNTHESIS AND CAD; ULTRASOUND RIGHT BREAST LIMITED TECHNIQUE: Right digital diagnostic mammography and breast tomosynthesis was performed. The images were evaluated with computer-aided detection. ; Targeted ultrasound examination of the right breast was performed COMPARISON:  Previous exam(s). ACR Breast Density Category c: The breasts are heterogeneously dense, which may obscure small masses. FINDINGS: Spot compression tomosynthesis views confirm a persistent oval obscured mass in the RIGHT upper breast at anterior depth. Targeted ultrasound was performed of the RIGHT upper breast. At 11:30 2 cm from the nipple, there is an oval circumscribed anechoic mass with posterior acoustic enhancement. It  measures 17 x  8 x 19 mm and is consistent with a benign simple cyst. This corresponds to the site of screening mammographic concern. IMPRESSION: There is a benign cyst at the site of screening mammographic concern. RECOMMENDATION: Screening mammogram in one year.(Code:SM-B-01Y) I have discussed the findings and recommendations with the patient. If applicable, a reminder letter will be sent to the patient regarding the next appointment. BI-RADS CATEGORY  2: Benign. Electronically Signed   By: Clancy Crimes M.D.   On: 12/28/2022 14:47       Assessment & Plan:  Routine general medical examination at a health care facility  Hyperglycemia Assessment & Plan: Low carb diet and exercise. Follow met b and A1c.   Lab Results  Component Value Date   HGBA1C 5.9 06/27/2023     Orders: -     Hemoglobin A1c; Future  Hypercholesterolemia Assessment & Plan: Continue crestor .  Low cholesterol diet and exercise.  Follow lipid panel and liver function tests.   Lab Results  Component Value Date   CHOL 170 06/27/2023   HDL 63.00 06/27/2023   LDLCALC 97 06/27/2023   LDLDIRECT 130.6 11/26/2012   TRIG 54.0 06/27/2023   CHOLHDL 3 06/27/2023     Orders: -     Lipid panel; Future -     Hepatic function panel; Future -     Basic metabolic panel with GFR; Future  Health care maintenance Assessment & Plan: Physical today 06/29/23.   Colonoscopy 12/2020: The examined portion of the ileum was normal.                       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                       - One 12 mm polyp in the ascending colon, removed with                        mucosal resection. Resected and retrieved.                       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                       - One 10 mm polyp in the descending colon, removed                        with a cold snare. Resected and retrieved.                       - Diverticulosis in the sigmoid colon.                       - Internal  hemorrhoids.   Recommended f/u colonoscopy in 3 years.  Mammogram 12/19/2022 - Birads 0.  F/u right breast mammogram an ultrasound 12/28/22 - Birads II. Recommended screening mammogram in one year.  Bone density 05/04/22 - osteopenia.    B12 deficiency Assessment & Plan: Check B12 level with next labs.    Gallbladder polyp Assessment & Plan: Ultrasound 09/2021 - no acute abnormality.  Gallbladder polyps - unchanged.  Previously saw GI.    Hypertension, unspecified type Assessment & Plan: Blood pressure as outlined. Doing well. On micardis  80mg  q day.  Follow pressures.  Follow metabolic panel.    Stress Assessment &  Plan: Appears to be handling stress relatively well.  Continues on Zoloft .  Now on 75mg  q day. Feels stable. No changes. Follow.       Dellar Fenton, MD

## 2023-06-29 NOTE — Assessment & Plan Note (Signed)
Ultrasound 09/2021 - no acute abnormality.  Gallbladder polyps - unchanged.  Previously saw GI.  

## 2023-06-29 NOTE — Assessment & Plan Note (Signed)
 Appears to be handling stress relatively well.  Continues on Zoloft .  Now on 75mg  q day. Feels stable. No changes. Follow.

## 2023-06-29 NOTE — Assessment & Plan Note (Signed)
 Continue crestor .  Low cholesterol diet and exercise.  Follow lipid panel and liver function tests.   Lab Results  Component Value Date   CHOL 170 06/27/2023   HDL 63.00 06/27/2023   LDLCALC 97 06/27/2023   LDLDIRECT 130.6 11/26/2012   TRIG 54.0 06/27/2023   CHOLHDL 3 06/27/2023

## 2023-06-29 NOTE — Assessment & Plan Note (Signed)
Blood pressure as outlined. Doing well. On micardis 80mg  q day.  Follow pressures.  Follow metabolic panel.

## 2023-06-29 NOTE — Assessment & Plan Note (Signed)
 Low carb diet and exercise. Follow met b and A1c.   Lab Results  Component Value Date   HGBA1C 5.9 06/27/2023

## 2023-06-29 NOTE — Assessment & Plan Note (Signed)
 Physical today 06/29/23.   Colonoscopy 12/2020: The examined portion of the ileum was normal.                       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                       - One 12 mm polyp in the ascending colon, removed with                        mucosal resection. Resected and retrieved.                       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                       - One 10 mm polyp in the descending colon, removed                        with a cold snare. Resected and retrieved.                       - Diverticulosis in the sigmoid colon.                       - Internal hemorrhoids.   Recommended f/u colonoscopy in 3 years.  Mammogram 12/19/2022 - Birads 0.  F/u right breast mammogram an ultrasound 12/28/22 - Birads II. Recommended screening mammogram in one year.  Bone density 05/04/22 - osteopenia.

## 2023-07-22 ENCOUNTER — Telehealth: Payer: Self-pay | Admitting: Internal Medicine

## 2023-07-22 NOTE — Telephone Encounter (Signed)
 Please call Theresa Francis and see if she is agreeable for f/u ultrasound to reevaluated her gallbladder (to confirm gallbladder polyp stable). She had ultrasound a couple of years ago that was stable. Would like to check just to confirm continued stability.

## 2023-07-27 ENCOUNTER — Other Ambulatory Visit: Payer: Self-pay | Admitting: Internal Medicine

## 2023-07-27 DIAGNOSIS — H02531 Eyelid retraction right upper eyelid: Secondary | ICD-10-CM | POA: Diagnosis not present

## 2023-07-27 DIAGNOSIS — H02831 Dermatochalasis of right upper eyelid: Secondary | ICD-10-CM | POA: Diagnosis not present

## 2023-07-27 DIAGNOSIS — K824 Cholesterolosis of gallbladder: Secondary | ICD-10-CM

## 2023-07-27 NOTE — Telephone Encounter (Signed)
 Patient is aware

## 2023-07-27 NOTE — Telephone Encounter (Signed)
 Patient is agreeable to have follow up ultrasound done

## 2023-07-27 NOTE — Telephone Encounter (Signed)
 Order placed for f/u ultrasound. Someone should be calling with appt date and time.

## 2023-07-27 NOTE — Progress Notes (Signed)
Order placed for f/u ultrasound

## 2023-08-02 ENCOUNTER — Ambulatory Visit: Payer: Self-pay | Admitting: Internal Medicine

## 2023-08-02 ENCOUNTER — Ambulatory Visit
Admission: RE | Admit: 2023-08-02 | Discharge: 2023-08-02 | Disposition: A | Discharge status: Home / Self Care | Source: Ambulatory Visit | Attending: Internal Medicine | Admitting: Internal Medicine

## 2023-08-02 DIAGNOSIS — K824 Cholesterolosis of gallbladder: Secondary | ICD-10-CM | POA: Diagnosis not present

## 2023-08-02 DIAGNOSIS — N281 Cyst of kidney, acquired: Secondary | ICD-10-CM | POA: Diagnosis not present

## 2023-08-15 ENCOUNTER — Encounter: Payer: Self-pay | Admitting: Genetic Counselor

## 2023-08-24 ENCOUNTER — Telehealth: Admitting: Physician Assistant

## 2023-08-24 DIAGNOSIS — R300 Dysuria: Secondary | ICD-10-CM | POA: Diagnosis not present

## 2023-08-24 DIAGNOSIS — R350 Frequency of micturition: Secondary | ICD-10-CM | POA: Diagnosis not present

## 2023-08-24 DIAGNOSIS — N390 Urinary tract infection, site not specified: Secondary | ICD-10-CM

## 2023-08-24 NOTE — Progress Notes (Signed)
  Because of concern for more complicated UTI with difficulty passing urine and dark brown/red urine and age > 44 warranting need for examination of back/kidneys and urine culture, I feel your condition warrants further evaluation and I recommend that you be seen in a face-to-face visit.   NOTE: There will be NO CHARGE for this E-Visit   If you are having a true medical emergency, please call 911.     For an urgent face to face visit, [facility] has multiple urgent care centers for your convenience.  Click the link below for the full list of locations and hours, walk-in wait times, appointment scheduling options and driving directions:  Urgent Care - [place], [place], [place], [place], [place], [place]       Your MyChart E-visit questionnaire answers were reviewed by a board certified advanced clinical practitioner to complete your personal care plan based on your specific symptoms.    Thank you for using e-Visits.

## 2023-09-13 ENCOUNTER — Other Ambulatory Visit: Payer: Self-pay | Admitting: Internal Medicine

## 2023-10-02 ENCOUNTER — Other Ambulatory Visit: Payer: Self-pay | Admitting: Internal Medicine

## 2023-10-31 ENCOUNTER — Other Ambulatory Visit

## 2023-11-02 ENCOUNTER — Ambulatory Visit: Admitting: Internal Medicine

## 2023-11-13 ENCOUNTER — Other Ambulatory Visit (INDEPENDENT_AMBULATORY_CARE_PROVIDER_SITE_OTHER)

## 2023-11-13 ENCOUNTER — Ambulatory Visit: Payer: Self-pay | Admitting: Internal Medicine

## 2023-11-13 DIAGNOSIS — E7800 Pure hypercholesterolemia, unspecified: Secondary | ICD-10-CM

## 2023-11-13 DIAGNOSIS — R739 Hyperglycemia, unspecified: Secondary | ICD-10-CM | POA: Diagnosis not present

## 2023-11-13 LAB — LIPID PANEL
Cholesterol: 149 mg/dL (ref 0–200)
HDL: 61.6 mg/dL (ref >39.00)
LDL Cholesterol: 76 mg/dL (ref 0–99)
NonHDL: 87.44
Total CHOL/HDL Ratio: 2
Triglycerides: 56 mg/dL (ref 0.0–149.0)
VLDL: 11.2 mg/dL (ref 0.0–40.0)

## 2023-11-13 LAB — BASIC METABOLIC PANEL WITH GFR
BUN: 15 mg/dL (ref 6–23)
CO2: 30 meq/L (ref 19–32)
Calcium: 9.3 mg/dL (ref 8.4–10.5)
Chloride: 105 meq/L (ref 96–112)
Creatinine, Ser: 0.71 mg/dL (ref 0.40–1.20)
GFR: 83.99 mL/min (ref >60.00)
Glucose, Bld: 105 mg/dL — ABNORMAL HIGH (ref 70–99)
Potassium: 4.1 meq/L (ref 3.5–5.1)
Sodium: 142 meq/L (ref 135–145)

## 2023-11-13 LAB — HEPATIC FUNCTION PANEL
ALT: 13 U/L (ref 0–35)
AST: 16 U/L (ref 0–37)
Albumin: 4.6 g/dL (ref 3.5–5.2)
Alkaline Phosphatase: 73 U/L (ref 39–117)
Bilirubin, Direct: 0.2 mg/dL (ref 0.0–0.3)
Total Bilirubin: 1.2 mg/dL (ref 0.2–1.2)
Total Protein: 6.8 g/dL (ref 6.0–8.3)

## 2023-11-13 LAB — HEMOGLOBIN A1C: Hgb A1c MFr Bld: 6.1 % (ref 4.6–6.5)

## 2023-11-14 ENCOUNTER — Other Ambulatory Visit: Payer: Self-pay | Admitting: Internal Medicine

## 2023-11-14 DIAGNOSIS — Z1231 Encounter for screening mammogram for malignant neoplasm of breast: Secondary | ICD-10-CM

## 2023-11-15 ENCOUNTER — Ambulatory Visit: Admitting: Internal Medicine

## 2023-11-15 VITALS — BP 126/70 | HR 80 | Resp 16 | Ht 64.0 in | Wt 148.6 lb

## 2023-11-15 DIAGNOSIS — I10 Essential (primary) hypertension: Secondary | ICD-10-CM

## 2023-11-15 DIAGNOSIS — R739 Hyperglycemia, unspecified: Secondary | ICD-10-CM

## 2023-11-15 DIAGNOSIS — F439 Reaction to severe stress, unspecified: Secondary | ICD-10-CM | POA: Diagnosis not present

## 2023-11-15 DIAGNOSIS — E7800 Pure hypercholesterolemia, unspecified: Secondary | ICD-10-CM | POA: Diagnosis not present

## 2023-11-15 DIAGNOSIS — K625 Hemorrhage of anus and rectum: Secondary | ICD-10-CM | POA: Diagnosis not present

## 2023-11-15 MED ORDER — SERTRALINE HCL 50 MG PO TABS: 75.0000 mg | ORAL_TABLET | Freq: Every day | ORAL | 1 refills | Status: DC

## 2023-11-15 NOTE — Progress Notes (Signed)
 Subjective:    Patient ID: Theresa Francis, female    DOB: [birth year], 73 y.o.   MRN: [id number]  Patient here for  Chief Complaint  Patient presents with   Medical Management of Chronic Issues    HPI Here for a scheduled follow up - . Continues on zoloft .  At previous visit, she had reported some bowel change with some occasional notice of blood when wiping. Saw GI. Recommended starting psyllium supplementation, sitz bath and topical medications (anusol or prep H) prn. Plan for repeat colonoscpy 2026. She is walking more - at least 1.5 miles q day. No chest pain or sob reported. No abdominal pain. Completed PFPT.    Past Medical History:  Diagnosis Date   Abnormal Pap smear    s/p conization.    Basal cell carcinoma    eye lid   History of chicken pox    History of shingles    Osteoarthritis    Urine incontinence    H/O occasional   Past Surgical History:  Procedure Laterality Date   BREAST BIOPSY Left 11/03/2014   neg./ done by Dr. 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   FINE NEEDLE ASPIRATION Left 2010   Dr Dessa   TONSILLECTOMY  1967   Family History  Problem Relation Age of Onset   Breast cancer Mother 32   Heart disease Father    Breast cancer Sister 84   Cancer Sister 74       angiosarcoma   Uterine cancer Maternal Aunt 85 - 65   Social History   Socioeconomic History   Marital status: Married    Spouse name: Not on file   Number of children: 3   Years of education: Not on file   Highest education level: Master's degree (e.g., MA, MS, MEng, MEd, MSW, MBA)  Occupational History   Not on file  Tobacco Use   Smoking status: Never    Smokeless tobacco: Never  Vaping Use   Vaping status: Never Used  Substance and Sexual Activity   Alcohol use: Yes    Alcohol/week: 0.0 standard drinks of alcohol    Comment: drinks wine occasionally   Drug use: No   Sexual activity: Not on file  Other Topics Concern   Not on file  Social History Narrative   Married   Social Drivers of Health   Financial Resource Strain: Low Risk  (02/27/2023)   Overall Financial Resource Strain (CARDIA)    Difficulty of Paying Living Expenses: Not hard at all  Food Insecurity: No Food Insecurity (02/27/2023)   Hunger Vital Sign    Worried About Running Out of Food in the Last Year: Never true    Ran Out of Food in the Last Year: Never true  Transportation Needs: No Transportation Needs (02/27/2023)   PRAPARE - Administrator, Civil Service (Medical): No    Lack of Transportation (Non-Medical): No  Physical Activity: Insufficiently Active (02/27/2023)   Exercise Vital Sign    Days of Exercise per Week: 3 days    Minutes of Exercise per Session: 40 min  Stress: No Stress  Concern Present (02/27/2023)   [employer] of Occupational Health - Occupational Stress Questionnaire    Feeling of Stress : Only a little  Social Connections: Socially Integrated (02/27/2023)   Social Connection and Isolation Panel    Frequency of Communication with Friends and Family: Twice a week    Frequency of Social Gatherings with Friends and Family: Three times a week    Attends Religious Services: More than 4 times per year    Active Member of Clubs or Organizations: Yes    Attends Banker Meetings: More than 4 times per year    Marital Status: Married     Review of Systems  Constitutional:  Negative for appetite change and unexpected weight change.  HENT:  Negative for congestion and sinus pressure.   Respiratory:  Negative for cough, chest tightness and shortness of breath.   Cardiovascular:  Negative for chest pain, palpitations and leg  swelling.  Gastrointestinal:  Negative for abdominal pain, diarrhea, nausea and vomiting.  Genitourinary:  Negative for difficulty urinating and dysuria.  Musculoskeletal:  Negative for joint swelling and myalgias.  Skin:  Negative for color change and rash.  Neurological:  Negative for dizziness and headaches.  Psychiatric/Behavioral:  Negative for agitation and dysphoric mood.        Objective:     BP 126/70   Pulse 80   Resp 16   Ht 5' 4 (1.626 m)   Wt 148 lb 9.6 oz (67.4 kg)   SpO2 98%   BMI 25.51 kg/m  Wt Readings from Last 3 Encounters:  11/15/23 148 lb 9.6 oz (67.4 kg)  06/29/23 149 lb (67.6 kg)  02/28/23 148 lb 9.6 oz (67.4 kg)    Physical Exam Vitals reviewed.  Constitutional:      General: She is not in acute distress.    Appearance: Normal appearance.  HENT:     Head: Normocephalic and atraumatic.     Right Ear: External ear normal.     Left Ear: External ear normal.     Mouth/Throat:     Pharynx: No oropharyngeal exudate or posterior oropharyngeal erythema.  Eyes:     General: No scleral icterus.       Right eye: No discharge.        Left eye: No discharge.     Conjunctiva/sclera: Conjunctivae normal.  Neck:     Thyroid : No thyromegaly.  Cardiovascular:     Rate and Rhythm: Normal rate and regular rhythm.  Pulmonary:     Effort: No respiratory distress.     Breath sounds: Normal breath sounds. No wheezing.  Abdominal:     General: Bowel sounds are normal.     Palpations: Abdomen is soft.     Tenderness: There is no abdominal tenderness.  Musculoskeletal:        General: No swelling or tenderness.     Cervical back: Neck supple. No tenderness.  Lymphadenopathy:     Cervical: No cervical adenopathy.  Skin:    Findings: No erythema or rash.  Neurological:     Mental Status: She is alert.  Psychiatric:        Mood and Affect: Mood normal.        Behavior: Behavior normal.         Outpatient Encounter Medications as of 11/15/2023   Medication Sig   acetaminophen (TYLENOL) 650 MG CR tablet Take 1,300 mg by mouth every 6 (six) hours as needed for pain.   Calcium  Carbonate-Vitamin D  (CALTRATE 600+D PO)  Take by mouth daily.   omeprazole (PRILOSEC) 20 MG capsule Take 20 mg by mouth every morning.   ondansetron  (ZOFRAN ) 4 MG tablet TAKE ONE TABLET BY MOUTH TWICE DAILY AS NEEDED FOR NAUSEA OR VOMITING   rosuvastatin  (CRESTOR ) 10 MG tablet TAKE ONE TABLET BY MOUTH EVERY DAY   sertraline  (ZOLOFT ) 50 MG tablet Take 1.5 tablets (75 mg total) by mouth daily.   telmisartan  (MICARDIS ) 80 MG tablet TAKE ONE TABLET BY MOUTH EVERY DAY   vitamin B-12 (CYANOCOBALAMIN) 100 MCG tablet Take 100 mcg by mouth daily.   [DISCONTINUED] sertraline  (ZOLOFT ) 50 MG tablet Take 1.5 tablets (75 mg total) by mouth daily.   No facility-administered encounter medications on file as of 11/15/2023.     Lab Results  Component Value Date   WBC 6.6 02/24/2023   HGB 13.0 02/24/2023   HCT 39.6 02/24/2023   PLT 285.0 02/24/2023   GLUCOSE 105 (H) 11/13/2023   CHOL 149 11/13/2023   TRIG 56.0 11/13/2023   HDL 61.60 11/13/2023   LDLDIRECT 130.6 11/26/2012   LDLCALC 76 11/13/2023   ALT 13 11/13/2023   AST 16 11/13/2023   NA 142 11/13/2023   K 4.1 11/13/2023   CL 105 11/13/2023   CREATININE 0.71 11/13/2023   BUN 15 11/13/2023   CO2 30 11/13/2023   TSH 2.26 02/24/2023   HGBA1C 6.1 11/13/2023    US  Abdomen Complete Result Date: 08/02/2023 CLINICAL DATA:  History of gallbladder polyps. EXAM: ABDOMEN ULTRASOUND COMPLETE COMPARISON:  September 24, 2021 FINDINGS: Gallbladder: Gallbladder polyps identified, largest measures 5 mm unchanged compared prior exam given the differences in measuring technique. No gallstones or wall thickening visualized. No sonographic Murphy sign noted by sonographer. Common bile duct: Diameter: 4 mm. Liver: No focal lesion identified. Within normal limits in parenchymal echogenicity. Portal vein is patent on color Doppler imaging with  normal direction of blood flow towards the liver. IVC: No abnormality visualized. Pancreas: Visualized portion unremarkable. Spleen: Size and appearance within normal limits. Right Kidney: Length: 10.1 cm. Echogenicity within normal limits. No mass or hydronephrosis visualized. Left Kidney: Length: 10.2 cm. Echogenicity within normal limits. No hydronephrosis visualized. An 8 x 8 x 11 mm peripelvic renal cyst is identified. No follow-up is recommended. Abdominal aorta: No aneurysm visualized. Other findings: None. IMPRESSION: Gallbladder polyps unchanged compared to prior ultrasound of August 2023. No follow-up is recommended. No acute abnormality identified in the abdomen. Electronically Signed   By: Craig Farr M.D.   On: 08/02/2023 14:00       Assessment & Plan:  Rectal bleeding Assessment & Plan: At previous visit, she had reported some bowel change with some occasional notice of blood when wiping. Saw GI. Recommended starting psyllium supplementation, sitz bath and topical medications (anusol or prep H) prn. Plan for repeat colonoscpy 2026   Stress Assessment & Plan: Continues on zoloft . Overall appears to be handling things well. Follow.   Orders: -     Sertraline  HCl; Take 1.5 tablets (75 mg total) by mouth daily.  Dispense: 135 tablet; Refill: 1  Hyperglycemia Assessment & Plan: Low carb diet and exercise. Follow met b and A1c.   Lab Results  Component Value Date   HGBA1C 6.1 11/13/2023     Orders: -     Hemoglobin A1c; Future  Hypercholesterolemia Assessment & Plan: Continue crestor .  Low cholesterol diet and exercise.  Follow lipid panel and liver function tests.   Lab Results  Component Value Date   CHOL 149 11/13/2023  HDL 61.60 11/13/2023   LDLCALC 76 11/13/2023   LDLDIRECT 130.6 11/26/2012   TRIG 56.0 11/13/2023   CHOLHDL 2 11/13/2023     Orders: -     Lipid panel; Future -     Hepatic function panel; Future -     Basic metabolic panel with GFR;  Future  Hypertension, unspecified type Assessment & Plan: Blood pressure as outlined. Doing well. On micardis  80mg  q day.  Follow pressures.  Follow metabolic panel. No change in medication today.   Orders: -     CBC with Differential/Platelet; Future -     TSH; Future     Allena Hamilton, MD

## 2023-11-19 ENCOUNTER — Encounter: Payer: Self-pay | Admitting: Internal Medicine

## 2023-11-19 NOTE — Assessment & Plan Note (Signed)
 At previous visit, she had reported some bowel change with some occasional notice of blood when wiping. Saw GI. Recommended starting psyllium supplementation, sitz bath and topical medications (anusol or prep H) prn. Plan for repeat colonoscpy 2026

## 2023-11-19 NOTE — Assessment & Plan Note (Signed)
 Low carb diet and exercise. Follow met b and A1c.   Lab Results  Component Value Date   HGBA1C 6.1 11/13/2023

## 2023-11-19 NOTE — Assessment & Plan Note (Signed)
 Continue crestor .  Low cholesterol diet and exercise.  Follow lipid panel and liver function tests.   Lab Results  Component Value Date   CHOL 149 11/13/2023   HDL 61.60 11/13/2023   LDLCALC 76 11/13/2023   LDLDIRECT 130.6 11/26/2012   TRIG 56.0 11/13/2023   CHOLHDL 2 11/13/2023

## 2023-11-19 NOTE — Assessment & Plan Note (Signed)
 Continues on zoloft . Overall appears to be handling things well. Follow.

## 2023-11-19 NOTE — Assessment & Plan Note (Signed)
 Blood pressure as outlined. Doing well. On micardis  80mg  q day.  Follow pressures.  Follow metabolic panel. No change in medication today.

## 2023-11-21 ENCOUNTER — Other Ambulatory Visit: Payer: Self-pay | Admitting: Internal Medicine

## 2023-11-21 NOTE — Telephone Encounter (Unsigned)
 Copied from CRM #[id number]. Topic: Clinical - Medication Refill >> Nov 21, 2023  9:29 AM [place] A wrote: Medication: omeprazole (PRILOSEC) 20 MG capsule  Has the patient contacted their pharmacy? Yes (Agent: If no, request that the patient contact the pharmacy for the refill. If patient does not wish to contact the pharmacy document the reason why and proceed with request.) (Agent: If yes, when and what did the pharmacy advise?)  This is the patient's preferred pharmacy:  [facility] - [place], [place] - [address] [place] [address] Phone: [phone] Fax: [phone]   Is this the correct pharmacy for this prescription? Yes If no, delete pharmacy and type the correct one.   Has the prescription been filled recently? No  Is the patient out of the medication? No 1 pill left  Has the patient been seen for an appointment in the last year OR does the patient have an upcoming appointment? Yes  Can we respond through MyChart? Yes  Agent: Please be advised that Rx refills may take up to 3 business days. We ask that you follow-up with your pharmacy.

## 2023-11-22 MED ORDER — OMEPRAZOLE 20 MG PO CPDR: 20.0000 mg | DELAYED_RELEASE_CAPSULE | Freq: Every morning | ORAL | 1 refills | Status: DC

## 2023-11-22 NOTE — Telephone Encounter (Signed)
 Rx ok'd for prilosec #90 with one refill.

## 2023-11-22 NOTE — Telephone Encounter (Signed)
 Called & LVM for pt to call office back to see if she is currently taking this medication also to see which PCP is prescribing it

## 2024-01-04 ENCOUNTER — Ambulatory Visit
Admission: RE | Admit: 2024-01-04 | Discharge: 2024-01-04 | Disposition: A | Discharge status: Home / Self Care | Source: Ambulatory Visit | Attending: Internal Medicine | Admitting: Internal Medicine

## 2024-01-04 DIAGNOSIS — Z1231 Encounter for screening mammogram for malignant neoplasm of breast: Secondary | ICD-10-CM | POA: Diagnosis not present

## 2024-01-09 ENCOUNTER — Ambulatory Visit (INDEPENDENT_AMBULATORY_CARE_PROVIDER_SITE_OTHER): Payer: Medicare PPO | Admitting: *Deleted

## 2024-01-09 VITALS — Ht 64.0 in | Wt 148.0 lb

## 2024-01-09 DIAGNOSIS — Z0000 Encounter for general adult medical examination without abnormal findings: Secondary | ICD-10-CM

## 2024-01-09 NOTE — Progress Notes (Signed)
 Chief Complaint  Patient presents with   Medicare Wellness     Subjective:   Theresa Francis is a 73 y.o. female who presents for a Medicare Annual Wellness Visit.  Allergies (verified) Patient has no known allergies.   History: Past Medical History:  Diagnosis Date   Abnormal Pap smear    s/p conization.    Basal cell carcinoma    eye lid   History of chicken pox    History of shingles    Osteoarthritis    Urine incontinence    H/O occasional   Past Surgical History:  Procedure Laterality Date   BREAST BIOPSY Left 11/03/2014   neg./ done by Dr. 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   FINE NEEDLE ASPIRATION Left 2010   Dr Dessa   TONSILLECTOMY  1967   Family History  Problem Relation Age of Onset   Breast cancer Mother 25   Heart disease Father    Breast cancer Sister 65   Cancer Sister 44       angiosarcoma   Uterine cancer Maternal Aunt 2 - 3   Social History   Occupational History   Not on file  Tobacco Use   Smoking status: Never   Smokeless tobacco: Never  Vaping Use   Vaping status: Never Used  Substance and Sexual Activity   Alcohol use: Yes    Alcohol/week: 0.0 standard drinks of alcohol    Comment: drinks wine occasionally   Drug use: No   Sexual activity: Not on file   Tobacco Counseling Counseling given: Not Answered  SDOH Screenings   Food Insecurity: No Food Insecurity (01/09/2024)  Housing: Unknown (01/09/2024)  Transportation Needs: No Transportation Needs (01/09/2024)  Utilities: Not At Risk (01/09/2024)  Alcohol Screen: Low Risk  (01/09/2024)  Depression (PHQ2-9): Low Risk  (01/09/2024)  Financial  Resource Strain: Low Risk  (01/09/2024)  Physical Activity: Insufficiently Active (01/09/2024)  Social Connections: Socially Integrated (01/09/2024)  Stress: No Stress Concern Present (01/09/2024)  Tobacco Use: Low Risk  (01/09/2024)  Health Literacy: Adequate Health Literacy (01/09/2024)   See flowsheets for full screening details  Depression Screen PHQ 2 & 9 Depression Scale- Over the past 2 weeks, how often have you been bothered by any of the following problems? Little interest or pleasure in doing things: 0 Feeling down, depressed, or hopeless (PHQ Adolescent also includes...irritable): 0 PHQ-2 Total Score: 0 Trouble falling or staying asleep, or sleeping too much: 0 Feeling tired or having little energy: 0 Poor appetite or overeating (PHQ Adolescent also includes...weight loss): 0 Feeling bad about yourself - or that you are a failure or have let yourself or your family down: 0 Trouble concentrating on things, such as reading the newspaper or watching television (PHQ Adolescent also includes...like school work): 0 Moving or speaking so slowly that other people could have noticed. Or the opposite - being so fidgety or restless that you have been moving around a lot more than usual: 0 Thoughts that you would be better off dead, or of hurting yourself in some way: 0 PHQ-9 Total Score: 0 If you checked off any problems,  how difficult have these problems made it for you to do your work, take care of things at home, or get along with other people?: Not difficult at all     Goals Addressed             This Visit's Progress    Patient Stated       [facility] and get back into Yoga classes       Visit info / Clinical Intake: Medicare Wellness Visit Type:: Subsequent Annual Wellness Visit Persons participating in visit:: patient Medicare Wellness Visit Mode:: Telephone If telephone:: video declined Because this visit was a virtual/telehealth visit:: pt  reported vitals If Telephone or Video please confirm:: I connected with the patient using audio enabled telemedicine application and verified that I am speaking with the correct person using two identifiers; I discussed the limitations of evaluation and management by telemedicine; The patient expressed understanding and agreed to proceed Patient Location:: Home Provider Location:: Office/Home Information given by:: patient Interpreter Needed?: No Pre-visit prep was completed: yes AWV questionnaire completed by patient prior to visit?: no Living arrangements:: lives with spouse/significant other Patient's Overall Health Status Rating: very good Typical amount of pain: none Does pain affect daily life?: no Are you currently prescribed opioids?: no  Dietary Habits and Nutritional Risks How many meals a day?: 3 Eats fruit and vegetables daily?: yes Most meals are obtained by: preparing own meals In the last 2 weeks, have you had any of the following?: (!) nausea, vomiting, diarrhea (nausea has acid reflux at times) Diabetic:: no  Functional Status Activities of Daily Living (to include ambulation/medication): Independent Ambulation: Independent Medication Administration: Independent Home Management: Independent Manage your own finances?: yes Primary transportation is: driving Concerns about vision?: (!) yes (cataracts) Concerns about hearing?: no  Fall Screening Falls in the past year?: 0 Number of falls in past year: 0 Was there an injury with Fall?: 0 Fall Risk Category Calculator: 0 Patient Fall Risk Level: Low Fall Risk  Fall Risk Patient at Risk for Falls Due to: No Fall Risks Fall risk Follow up: Falls evaluation completed; Falls prevention discussed  Home and Transportation Safety: All rugs have non-skid backing?: yes All stairs or steps have railings?: yes Grab bars in the bathtub or shower?: (!) no Have non-skid surface in bathtub or shower?: yes Good home  lighting?: yes Regular seat belt use?: yes Hospital stays in the last year:: no  Cognitive Assessment Difficulty concentrating, remembering, or making decisions? : no Will 6CIT or Mini Cog be Completed: yes What year is it?: 0 points What month is it?: 0 points Give patient an address phrase to remember (5 components): [address] [place] About what time is it?: 0 points Count backwards from 20 to 1: 0 points Say the months of the year in reverse: 0 points Repeat the address phrase from earlier: 0 points 6 CIT Score: 0 points  Advance Directives (For Healthcare) Does Patient Have a Medical Advance Directive?: Yes Does patient want to make changes to medical advance directive?: No - Guardian declined Type of Advance Directive: Healthcare Power of [place]; Living will Copy of Healthcare Power of Attorney in Chart?: No - copy requested Copy of Living Will in Chart?: No - copy requested  Reviewed/Updated  Reviewed/Updated: Reviewed All (Medical, Surgical, Family, Medications, Allergies, Care Teams, Patient Goals)        Objective:    Today's Vitals   01/09/24 0933  Weight: 148 lb (67.1  kg)  Height: 5' 4 (1.626 m)   Body mass index is 25.4 kg/m.  Current Medications (verified) Outpatient Encounter Medications as of 01/09/2024  Medication Sig   acetaminophen (TYLENOL) 650 MG CR tablet Take 1,300 mg by mouth every 6 (six) hours as needed for pain.   Calcium  Carbonate-Vitamin D  (CALTRATE 600+D PO) Take by mouth daily.   omeprazole (PRILOSEC) 20 MG capsule Take 1 capsule (20 mg total) by mouth every morning.   ondansetron  (ZOFRAN ) 4 MG tablet TAKE ONE TABLET BY MOUTH TWICE DAILY AS NEEDED FOR NAUSEA OR VOMITING   rosuvastatin  (CRESTOR ) 10 MG tablet TAKE ONE TABLET BY MOUTH EVERY DAY   sertraline  (ZOLOFT ) 50 MG tablet Take 1.5 tablets (75 mg total) by mouth daily.   telmisartan  (MICARDIS ) 80 MG tablet TAKE ONE TABLET BY MOUTH EVERY DAY   vitamin B-12 (CYANOCOBALAMIN)  100 MCG tablet Take 100 mcg by mouth daily.   No facility-administered encounter medications on file as of 01/09/2024.   Hearing/Vision screen Hearing Screening - Comments:: No issues Vision Screening - Comments:: Glasses, [facility], up to date Immunizations and Health Maintenance Health Maintenance  Topic Date Due   Colonoscopy  04/14/2024   COVID-19 Vaccine (8 - 2025-26 season) 05/15/2024   Medicare Annual Wellness (AWV)  01/08/2025   Mammogram  01/03/2026   DTaP/Tdap/Td (2 - Td or Tdap) 05/07/2031   Pneumococcal Vaccine: 50+ Years  Completed   Influenza Vaccine  Completed   DEXA SCAN  Completed   Hepatitis C Screening  Completed   Zoster Vaccines- Shingrix  Completed   Meningococcal B Vaccine  Aged Out   Fecal DNA (Cologuard)  Discontinued        Assessment/Plan:  This is a routine wellness examination for [facility].  Patient Care Team: Glendia Shad, MD as PCP - General (Internal Medicine) Glendia Shad, MD (Internal Medicine) Dasher, Alm LABOR, MD (Dermatology) Gean Toribio LABOR, MD as Referring Physician (Gastroenterology)  I have personally reviewed and noted the following in the patient's chart:   Medical and social history Use of alcohol, tobacco or illicit drugs  Current medications and supplements including opioid prescriptions. Functional ability and status Nutritional status Physical activity Advanced directives List of other physicians Hospitalizations, surgeries, and ER visits in previous 12 months Vitals Screenings to include cognitive, depression, and falls Referrals and appointments  No orders of the defined types were placed in this encounter.  In addition, I have reviewed and discussed with patient certain preventive protocols, quality metrics, and best practice recommendations. A written personalized care plan for preventive services as well as general preventive health recommendations were provided to patient.   Angeline Fredericks,  LPN   [id number]   Return in 1 year (on 01/08/2025).  After Visit Summary: (MyChart) Due to this being a telephonic visit, the after visit summary with patients personalized plan was offered to patient via MyChart   Nurse Notes: Discussed the need to keep vaccines up to date. Patient stated that she is aware that she is due a colonoscopy in 2026.

## 2024-01-09 NOTE — Patient Instructions (Signed)
 Theresa Francis,  Thank you for taking the time for your Medicare Wellness Visit. I appreciate your continued commitment to your health goals. Please review the care plan we discussed, and feel free to reach out if I can assist you further.  Please note that Annual Wellness Visits do not include a physical exam. Some assessments may be limited, especially if the visit was conducted virtually. If needed, we may recommend an in-person follow-up with your provider.  Ongoing Care Seeing your primary care provider every 3 to 6 months helps us  monitor your health and provide consistent, personalized care.  Remember to keep your vaccines up to date.   Referrals If a referral was made during today's visit and you haven't received any updates within two weeks, please contact the referred provider directly to check on the status.  Recommended Screenings:  Health Maintenance  Topic Date Due   Colon Cancer Screening  04/14/2024   COVID-19 Vaccine (8 - 2025-26 season) 05/15/2024   Medicare Annual Wellness Visit  01/08/2025   Breast Cancer Screening  01/03/2026   DTaP/Tdap/Td vaccine (2 - Td or Tdap) 05/07/2031   Pneumococcal Vaccine for age over 16  Completed   Flu Shot  Completed   DEXA scan (bone density measurement)  Completed   Hepatitis C Screening  Completed   Zoster (Shingles) Vaccine  Completed   Meningitis B Vaccine  Aged Out   Cologuard (Stool DNA test)  Discontinued       01/09/2024    9:36 AM  Advanced Directives  Does Patient Have a Medical Advance Directive? Yes  Type of Estate Agent of [place];Living will  Does patient want to make changes to medical advance directive? No - Guardian declined  Copy of Healthcare Power of Attorney in Chart? No - copy requested    Vision: Annual vision screenings are recommended for early detection of glaucoma, cataracts, and diabetic retinopathy. These exams can also reveal signs of chronic conditions such as diabetes and  high blood pressure.  Dental: Annual dental screenings help detect early signs of oral cancer, gum disease, and other conditions linked to overall health, including heart disease and diabetes.  Please see the attached documents for additional preventive care recommendations.

## 2024-02-14 ENCOUNTER — Encounter: Payer: Self-pay | Admitting: Pharmacist

## 2024-02-14 NOTE — Progress Notes (Signed)
 Pharmacy Quality Measure Review  This patient is appearing on a report for being at risk of failing the adherence measure for cholesterol (statin) medications this calendar year.   Medication: rosuvastatin  10 mg Last fill date: 09/14/23 for 90 day supply  Insurance report was not up to date. No action needed at this time.  Medication has been refilled as of 11/17 x90 ds.

## 2024-02-24 ENCOUNTER — Other Ambulatory Visit: Payer: Self-pay | Admitting: Internal Medicine

## 2024-03-14 ENCOUNTER — Other Ambulatory Visit (INDEPENDENT_AMBULATORY_CARE_PROVIDER_SITE_OTHER)

## 2024-03-14 ENCOUNTER — Ambulatory Visit: Payer: Self-pay | Admitting: Internal Medicine

## 2024-03-14 DIAGNOSIS — E7800 Pure hypercholesterolemia, unspecified: Secondary | ICD-10-CM | POA: Diagnosis not present

## 2024-03-14 DIAGNOSIS — R739 Hyperglycemia, unspecified: Secondary | ICD-10-CM | POA: Diagnosis not present

## 2024-03-14 DIAGNOSIS — I10 Essential (primary) hypertension: Secondary | ICD-10-CM

## 2024-03-14 LAB — BASIC METABOLIC PANEL WITH GFR
BUN: 18 mg/dL (ref 6–23)
CO2: 29 meq/L (ref 19–32)
Calcium: 9.1 mg/dL (ref 8.4–10.5)
Chloride: 106 meq/L (ref 96–112)
Creatinine, Ser: 0.74 mg/dL (ref 0.40–1.20)
GFR: 79.73 mL/min
Glucose, Bld: 99 mg/dL (ref 70–99)
Potassium: 4.2 meq/L (ref 3.5–5.1)
Sodium: 142 meq/L (ref 135–145)

## 2024-03-14 LAB — CBC WITH DIFFERENTIAL/PLATELET
Basophils Absolute: 0 K/uL (ref 0.0–0.1)
Basophils Relative: 0.4 % (ref 0.0–3.0)
Eosinophils Absolute: 0.1 K/uL (ref 0.0–0.7)
Eosinophils Relative: 1.8 % (ref 0.0–5.0)
HCT: 38 % (ref 36.0–46.0)
Hemoglobin: 12.8 g/dL (ref 12.0–15.0)
Lymphocytes Relative: 32.8 % (ref 12.0–46.0)
Lymphs Abs: 2 K/uL (ref 0.7–4.0)
MCHC: 33.7 g/dL (ref 30.0–36.0)
MCV: 86.3 fl (ref 78.0–100.0)
Monocytes Absolute: 0.5 K/uL (ref 0.1–1.0)
Monocytes Relative: 8 % (ref 3.0–12.0)
Neutro Abs: 3.4 K/uL (ref 1.4–7.7)
Neutrophils Relative %: 57 % (ref 43.0–77.0)
Platelets: 226 K/uL (ref 150.0–400.0)
RBC: 4.41 Mil/uL (ref 3.87–5.11)
RDW: 13.5 % (ref 11.5–15.5)
WBC: 6.1 K/uL (ref 4.0–10.5)

## 2024-03-14 LAB — LIPID PANEL
Cholesterol: 141 mg/dL (ref 28–200)
HDL: 60.8 mg/dL
LDL Cholesterol: 65 mg/dL (ref 10–99)
NonHDL: 80.22
Total CHOL/HDL Ratio: 2
Triglycerides: 75 mg/dL (ref 10.0–149.0)
VLDL: 15 mg/dL (ref 0.0–40.0)

## 2024-03-14 LAB — HEPATIC FUNCTION PANEL
ALT: 14 U/L (ref 3–35)
AST: 16 U/L (ref 5–37)
Albumin: 4.6 g/dL (ref 3.5–5.2)
Alkaline Phosphatase: 78 U/L (ref 39–117)
Bilirubin, Direct: 0.2 mg/dL (ref 0.1–0.3)
Total Bilirubin: 1.1 mg/dL (ref 0.2–1.2)
Total Protein: 6.8 g/dL (ref 6.0–8.3)

## 2024-03-14 LAB — HEMOGLOBIN A1C: Hgb A1c MFr Bld: 6 % (ref 4.6–6.5)

## 2024-03-14 LAB — TSH: TSH: 2.09 u[IU]/mL (ref 0.35–5.50)

## 2024-03-19 ENCOUNTER — Ambulatory Visit: Admitting: Internal Medicine

## 2024-03-19 ENCOUNTER — Telehealth: Payer: Self-pay

## 2024-03-19 ENCOUNTER — Encounter: Payer: Self-pay | Admitting: Internal Medicine

## 2024-03-19 VITALS — BP 120/70 | HR 89 | Temp 98.1°F | Ht 64.0 in | Wt 149.6 lb

## 2024-03-19 DIAGNOSIS — Z860100 Personal history of colon polyps, unspecified: Secondary | ICD-10-CM | POA: Diagnosis not present

## 2024-03-19 DIAGNOSIS — K625 Hemorrhage of anus and rectum: Secondary | ICD-10-CM | POA: Diagnosis not present

## 2024-03-19 DIAGNOSIS — E7800 Pure hypercholesterolemia, unspecified: Secondary | ICD-10-CM

## 2024-03-19 DIAGNOSIS — F439 Reaction to severe stress, unspecified: Secondary | ICD-10-CM

## 2024-03-19 DIAGNOSIS — I10 Essential (primary) hypertension: Secondary | ICD-10-CM

## 2024-03-19 DIAGNOSIS — R739 Hyperglycemia, unspecified: Secondary | ICD-10-CM | POA: Diagnosis not present

## 2024-03-19 DIAGNOSIS — K824 Cholesterolosis of gallbladder: Secondary | ICD-10-CM | POA: Diagnosis not present

## 2024-03-19 DIAGNOSIS — W19XXXD Unspecified fall, subsequent encounter: Secondary | ICD-10-CM | POA: Diagnosis not present

## 2024-03-19 MED ORDER — SERTRALINE HCL 50 MG PO TABS: 75.0000 mg | ORAL_TABLET | Freq: Every day | ORAL | 1 refills | Status: AC

## 2024-03-19 MED ORDER — OMEPRAZOLE 20 MG PO CPDR: 20.0000 mg | DELAYED_RELEASE_CAPSULE | Freq: Every day | ORAL | 1 refills | Status: AC

## 2024-03-19 NOTE — Progress Notes (Signed)
 "  Subjective:    Patient ID: Theresa Francis, female    DOB: Jul 08, 1950, 74 y.o.   MRN: [id number]  Patient here for  Chief Complaint  Patient presents with   Medical Management of Chronic Issues    HPI Here for a scheduled follow up - follow up regarding increased stress, hypertension and hypercholesterolemia. At previous visit, she had reported some bowel change with some occasional notice of blood when wiping. Saw GI. Recommended starting psyllium supplementation, sitz bath and topical medications (anusol or prep H) prn. Plan for repeat colonoscpy 2026.  Overall doing well - bowel movement in am. Also has gallbladder polyps. Abdominal ultrasound 08/02/23 - unchanged. Per radiology - no follow up recommended. Completed PFPT. Continues on zoloft . Doing well on this medication. Did report a fall 12/22. [place] in her eyes. Turned her ankle and fell. She did hit her head. Scraped her right knee. Right rib bruised. No persistent headache. No dizziness or light headedness.    Past Medical History:  Diagnosis Date   Abnormal Pap smear    s/p conization.    Basal cell carcinoma    eye lid   GERD (gastroesophageal reflux disease)    History of chicken pox    History of shingles    Hyperlipidemia    Hypertension 01/2018   Osteoarthritis    Urine incontinence    H/O occasional   Past Surgical History:  Procedure Laterality Date   BREAST BIOPSY Left 11/03/2014   neg./ done by Dr. 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   FINE NEEDLE ASPIRATION Left 2010   Dr Dessa   JOINT REPLACEMENT  Nov 2017   Hip   TONSILLECTOMY  1967    Family History  Problem Relation Age of Onset   Breast cancer Mother 77   Arthritis Mother    Heart disease Father    Breast cancer Sister 54   Cancer Sister    Cancer Sister 65       angiosarcoma   Uterine cancer Maternal Aunt 26 - 40   Cancer Sister    Social History   Socioeconomic History   Marital status: Married    Spouse name: Not on file   Number of children: 3   Years of education: Not on file   Highest education level: Master's degree (e.g., MA, MS, MEng, MEd, MSW, MBA)  Occupational History   Not on file  Tobacco Use   Smoking status: Never   Smokeless tobacco: Never  Vaping Use   Vaping status: Never Used  Substance and Sexual Activity   Alcohol use: Yes    Alcohol/week: 0.0 standard drinks of alcohol    Comment: drinks wine occasionally   Drug use: No   Sexual activity: Not on file  Other Topics Concern   Not on file  Social History Narrative   Married   Social Drivers of Health   Tobacco Use: Low Risk (03/24/2024)   Patient History    Smoking Tobacco Use: Never    Smokeless Tobacco Use: Never    Passive Exposure: Not on  file  Financial Resource Strain: Low Risk (03/18/2024)   Overall Financial Resource Strain (CARDIA)    Difficulty of Paying Living Expenses: Not hard at all  Food Insecurity: No Food Insecurity (03/18/2024)   Epic    Worried About Programme Researcher, Broadcasting/film/video in the Last Year: Never true    Ran Out of Food in the Last Year: Never true  Transportation Needs: No Transportation Needs (03/18/2024)   Epic    Lack of Transportation (Medical): No    Lack of Transportation (Non-Medical): No  Physical Activity: Insufficiently Active (03/18/2024)   Exercise Vital Sign    Days of Exercise per Week: 4 days    Minutes of Exercise per Session: 30 min  Stress: No Stress Concern Present (03/18/2024)   [employer] of Occupational Health - Occupational Stress Questionnaire    Feeling of Stress: Only a little  Social Connections: Socially Integrated  (03/18/2024)   Social Connection and Isolation Panel    Frequency of Communication with Friends and Family: More than three times a week    Frequency of Social Gatherings with Friends and Family: Twice a week    Attends Religious Services: More than 4 times per year    Active Member of Clubs or Organizations: Yes    Attends Banker Meetings: More than 4 times per year    Marital Status: Married  Depression (PHQ2-9): Low Risk (01/09/2024)   Depression (PHQ2-9)    PHQ-2 Score: 0  Alcohol Screen: Low Risk (03/18/2024)   Alcohol Screen    Last Alcohol Screening Score (AUDIT): 3  Housing: Low Risk (03/18/2024)   Epic    Unable to Pay for Housing in the Last Year: No    Number of Times Moved in the Last Year: 0    Homeless in the Last Year: No  Utilities: Not At Risk (01/09/2024)   Epic    Threatened with loss of utilities: No  Health Literacy: Adequate Health Literacy (01/09/2024)   B1300 Health Literacy    Frequency of need for help with medical instructions: Never     Review of Systems  Constitutional:  Negative for appetite change and unexpected weight change.  HENT:  Negative for congestion and sinus pressure.   Respiratory:  Negative for cough, chest tightness and shortness of breath.   Cardiovascular:  Negative for chest pain, palpitations and leg swelling.  Gastrointestinal:  Negative for abdominal pain, diarrhea, nausea and vomiting.  Genitourinary:  Negative for difficulty urinating and dysuria.  Musculoskeletal:  Negative for joint swelling and myalgias.  Skin:  Negative for color change and rash.  Neurological:  Negative for dizziness and headaches.  Psychiatric/Behavioral:  Negative for agitation and dysphoric mood.        Objective:     BP 120/70   Pulse 89   Temp 98.1 F (36.7 C) (Oral)   Ht 5' 4 (1.626 m)   Wt 149 lb 9.6 oz (67.9 kg)   SpO2 98%   BMI 25.68 kg/m  Wt Readings from Last 3 Encounters:  03/19/24 149 lb 9.6 oz (67.9 kg)  01/09/24  148 lb (67.1 kg)  11/15/23 148 lb 9.6 oz (67.4 kg)    Physical Exam Vitals reviewed.  Constitutional:      General: She is not in acute distress.    Appearance: Normal appearance.  HENT:     Head: Normocephalic and atraumatic.     Right Ear: External ear normal.     Left Ear: External ear normal.  Mouth/Throat:     Pharynx: No oropharyngeal exudate or posterior oropharyngeal erythema.  Eyes:     General: No scleral icterus.       Right eye: No discharge.        Left eye: No discharge.     Conjunctiva/sclera: Conjunctivae normal.  Neck:     Thyroid : No thyromegaly.  Cardiovascular:     Rate and Rhythm: Normal rate and regular rhythm.  Pulmonary:     Effort: No respiratory distress.     Breath sounds: Normal breath sounds. No wheezing.  Abdominal:     General: Bowel sounds are normal.     Palpations: Abdomen is soft.     Tenderness: There is no abdominal tenderness.  Musculoskeletal:        General: No swelling or tenderness.     Cervical back: Neck supple. No tenderness.  Lymphadenopathy:     Cervical: No cervical adenopathy.  Skin:    Findings: No erythema or rash.  Neurological:     Mental Status: She is alert.  Psychiatric:        Mood and Affect: Mood normal.        Behavior: Behavior normal.         Outpatient Encounter Medications as of 03/19/2024  Medication Sig   acetaminophen (TYLENOL) 650 MG CR tablet Take 1,300 mg by mouth every 6 (six) hours as needed for pain.   Calcium  Carbonate-Vitamin D  (CALTRATE 600+D PO) Take by mouth daily.   ondansetron  (ZOFRAN ) 4 MG tablet TAKE ONE TABLET BY MOUTH TWICE DAILY AS NEEDED FOR NAUSEA OR VOMITING   rosuvastatin  (CRESTOR ) 10 MG tablet TAKE ONE TABLET BY MOUTH EVERY DAY   telmisartan  (MICARDIS ) 80 MG tablet TAKE ONE TABLET BY MOUTH EVERY DAY   vitamin B-12 (CYANOCOBALAMIN ) 100 MCG tablet Take 100 mcg by mouth daily.   omeprazole  (PRILOSEC) 20 MG capsule Take 1 capsule (20 mg total) by mouth daily.   sertraline   (ZOLOFT ) 50 MG tablet Take 1.5 tablets (75 mg total) by mouth daily.   [DISCONTINUED] omeprazole  (PRILOSEC) 20 MG capsule TAKE 1 CAPSULE (20 MG) BY MOUTH EVERY MORNING   [DISCONTINUED] sertraline  (ZOLOFT ) 50 MG tablet Take 1.5 tablets (75 mg total) by mouth daily.   No facility-administered encounter medications on file as of 03/19/2024.     Lab Results  Component Value Date   WBC 6.1 03/14/2024   HGB 12.8 03/14/2024   HCT 38.0 03/14/2024   PLT 226.0 03/14/2024   GLUCOSE 99 03/14/2024   CHOL 141 03/14/2024   TRIG 75.0 03/14/2024   HDL 60.80 03/14/2024   LDLDIRECT 130.6 11/26/2012   LDLCALC 65 03/14/2024   ALT 14 03/14/2024   AST 16 03/14/2024   NA 142 03/14/2024   K 4.2 03/14/2024   CL 106 03/14/2024   CREATININE 0.74 03/14/2024   BUN 18 03/14/2024   CO2 29 03/14/2024   TSH 2.09 03/14/2024   HGBA1C 6.0 03/14/2024    MM 3D SCREENING MAMMOGRAM BILATERAL BREAST Result Date: 01/08/2024 CLINICAL DATA:  Screening. EXAM: DIGITAL SCREENING BILATERAL MAMMOGRAM WITH TOMOSYNTHESIS AND CAD TECHNIQUE: Bilateral screening digital craniocaudal and mediolateral oblique mammograms were obtained. Bilateral screening digital breast tomosynthesis was performed. The images were evaluated with computer-aided detection. COMPARISON:  Previous exam(s). ACR Breast Density Category b: There are scattered areas of fibroglandular density. FINDINGS: There are no findings suspicious for malignancy. IMPRESSION: No mammographic evidence of malignancy. A result letter of this screening mammogram will be mailed directly to the patient. RECOMMENDATION: Screening mammogram  in one year. (Code:SM-B-01Y) BI-RADS CATEGORY  1: Negative. Electronically Signed   By: Dina  Arceo M.D.   On: 01/08/2024 05:13       Assessment & Plan:  Rectal bleeding Assessment & Plan: At previous visit, she had reported some bowel change with some occasional notice of blood when wiping. Saw GI. Recommended starting psyllium supplementation,  sitz bath and topical medications (anusol or prep H) prn. Plan for repeat colonoscpy 2026.     Stress Assessment & Plan: Continues zoloft . Overall appears to be doing well. Follow.   Orders: -     Sertraline  HCl; Take 1.5 tablets (75 mg total) by mouth daily.  Dispense: 135 tablet; Refill: 1  Hyperglycemia Assessment & Plan: Low carb diet and exercise. Follow met b and A1c.   Lab Results  Component Value Date   HGBA1C 6.0 03/14/2024     Orders: -     Hemoglobin A1c; Future  Hypercholesterolemia Assessment & Plan: Continue crestor .  Low cholesterol diet and exercise.  Follow lipid panel.  Lab Results  Component Value Date   CHOL 141 03/14/2024   HDL 60.80 03/14/2024   LDLCALC 65 03/14/2024   LDLDIRECT 130.6 11/26/2012   TRIG 75.0 03/14/2024   CHOLHDL 2 03/14/2024     Orders: -     Lipid panel; Future -     Hepatic function panel; Future -     Basic metabolic panel with GFR; Future  Hypertension, unspecified type Assessment & Plan: Blood pressure as outlined. Doing well. On micardis  80mg  q day.  Follow pressures.  Follow metabolic panel.    History of colon polyps Assessment & Plan: Colonoscopy 03/2021 - One 12 mm polyp in the ascending colon, removed with mucosal resection. Resected and retrieved.  Post mucosectomy scar in the ascending colon.  One 10 mm polyp in the descending colon, removed with a cold snare. Resected and retrieved.  Diverticulosis in the sigmoid colon.  Internal hemorrhoids. Mucosal resection was performed. Resection and retrieval were complete.  Recommended f/u colonoscopy in 3 years. At previous visit, she had reported some bowel change with some occasional notice of blood when wiping. Saw GI. Recommended starting psyllium supplementation, sitz bath and topical medications (anusol or prep H) prn. Plan for repeat colonoscpy 2026.      Gallbladder polyp Assessment & Plan:  Also has gallbladder polyps. Abdominal ultrasound 08/02/23 - unchanged. Per  radiology - no follow up recommended.   Fall, subsequent encounter Assessment & Plan: Fall 01/2021. Hit head. No residual headache or dizziness. Rib pain improved. Able to take a deep breath without significant pain. Follow.    Other orders -     Omeprazole ; Take 1 capsule (20 mg total) by mouth daily.  Dispense: 90 capsule; Refill: 1     Allena Hamilton, MD "

## 2024-03-19 NOTE — Assessment & Plan Note (Addendum)
 Continue crestor .  Low cholesterol diet and exercise.  Follow lipid panel.  Lab Results  Component Value Date   CHOL 141 03/14/2024   HDL 60.80 03/14/2024   LDLCALC 65 03/14/2024   LDLDIRECT 130.6 11/26/2012   TRIG 75.0 03/14/2024   CHOLHDL 2 03/14/2024

## 2024-03-19 NOTE — Telephone Encounter (Signed)
 Lvm for pt to return call. Pt has an appointment today at 10 am wanted to ensure pt was coming or if need to convert to virtual.

## 2024-03-24 ENCOUNTER — Encounter: Payer: Self-pay | Admitting: Internal Medicine

## 2024-03-24 DIAGNOSIS — W19XXXA Unspecified fall, initial encounter: Secondary | ICD-10-CM | POA: Insufficient documentation

## 2024-03-24 NOTE — Assessment & Plan Note (Signed)
 Low carb diet and exercise. Follow met b and A1c.   Lab Results  Component Value Date   HGBA1C 6.0 03/14/2024

## 2024-03-24 NOTE — Assessment & Plan Note (Signed)
 Colonoscopy 03/2021 - One 12 mm polyp in the ascending colon, removed with mucosal resection. Resected and retrieved.  Post mucosectomy scar in the ascending colon.  One 10 mm polyp in the descending colon, removed with a cold snare. Resected and retrieved.  Diverticulosis in the sigmoid colon.  Internal hemorrhoids. Mucosal resection was performed. Resection and retrieval were complete.  Recommended f/u colonoscopy in 3 years. At previous visit, she had reported some bowel change with some occasional notice of blood when wiping. Saw GI. Recommended starting psyllium supplementation, sitz bath and topical medications (anusol or prep H) prn. Plan for repeat colonoscpy 2026.

## 2024-03-24 NOTE — Assessment & Plan Note (Signed)
 At previous visit, she had reported some bowel change with some occasional notice of blood when wiping. Saw GI. Recommended starting psyllium supplementation, sitz bath and topical medications (anusol or prep H) prn. Plan for repeat colonoscpy 2026

## 2024-03-24 NOTE — Assessment & Plan Note (Signed)
 Fall 01/2021. Hit head. No residual headache or dizziness. Rib pain improved. Able to take a deep breath without significant pain. Follow.

## 2024-03-24 NOTE — Assessment & Plan Note (Signed)
 Continues zoloft . Overall appears to be doing well. Follow.

## 2024-03-24 NOTE — Assessment & Plan Note (Signed)
Blood pressure as outlined. Doing well. On micardis 80mg  q day.  Follow pressures.  Follow metabolic panel.

## 2024-03-24 NOTE — Assessment & Plan Note (Signed)
"   Also has gallbladder polyps. Abdominal ultrasound 08/02/23 - unchanged. Per radiology - no follow up recommended. "

## 2024-07-12 ENCOUNTER — Other Ambulatory Visit

## 2024-07-17 ENCOUNTER — Ambulatory Visit: Admitting: Internal Medicine

## 2025-01-14 ENCOUNTER — Ambulatory Visit
